# Patient Record
Sex: Female | Born: 1955 | Race: Black or African American | Hispanic: No | Marital: Single | Smoking: Former smoker
Health system: Southern US, Community
[De-identification: ages and names within clinical notes are randomized; demographics above are authoritative.]

## PROBLEM LIST (undated history)

## (undated) ENCOUNTER — Emergency Department (HOSPITAL_COMMUNITY): Discharge status: Absent without leave | Payer: BLUE CROSS/BLUE SHIELD

## (undated) DIAGNOSIS — M1990 Unspecified osteoarthritis, unspecified site: Secondary | ICD-10-CM

## (undated) DIAGNOSIS — H43393 Other vitreous opacities, bilateral: Secondary | ICD-10-CM

## (undated) DIAGNOSIS — E119 Type 2 diabetes mellitus without complications: Secondary | ICD-10-CM

## (undated) DIAGNOSIS — I10 Essential (primary) hypertension: Secondary | ICD-10-CM

## (undated) DIAGNOSIS — H538 Other visual disturbances: Secondary | ICD-10-CM

## (undated) DIAGNOSIS — E785 Hyperlipidemia, unspecified: Secondary | ICD-10-CM

## (undated) HISTORY — DX: Essential (primary) hypertension: I10

## (undated) HISTORY — DX: Type 2 diabetes mellitus without complications: E11.9

## (undated) HISTORY — DX: Unspecified osteoarthritis, unspecified site: M19.90

## (undated) HISTORY — PX: TUBAL LIGATION: SHX77

## (undated) HISTORY — DX: Hyperlipidemia, unspecified: E78.5

## (undated) HISTORY — PX: COLONOSCOPY: SHX174

---
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
## 1999-02-14 ENCOUNTER — Emergency Department (HOSPITAL_COMMUNITY): Admission: EM | Admit: 1999-02-14 | Discharge: 1999-02-14 | Payer: Self-pay | Admitting: Emergency Medicine

## 1999-07-26 ENCOUNTER — Emergency Department (HOSPITAL_COMMUNITY): Admission: EM | Admit: 1999-07-26 | Discharge: 1999-07-27 | Payer: Self-pay | Admitting: Emergency Medicine

## 1999-07-27 ENCOUNTER — Encounter: Payer: Self-pay | Admitting: Emergency Medicine

## 1999-07-29 ENCOUNTER — Emergency Department (HOSPITAL_COMMUNITY): Admission: EM | Admit: 1999-07-29 | Discharge: 1999-07-29 | Payer: Self-pay | Admitting: Emergency Medicine

## 2000-01-09 ENCOUNTER — Emergency Department (HOSPITAL_COMMUNITY): Admission: EM | Admit: 2000-01-09 | Discharge: 2000-01-09 | Payer: Self-pay | Admitting: Emergency Medicine

## 2000-01-14 ENCOUNTER — Emergency Department (HOSPITAL_COMMUNITY): Admission: EM | Admit: 2000-01-14 | Discharge: 2000-01-14 | Payer: Self-pay | Admitting: *Deleted

## 2001-02-05 ENCOUNTER — Emergency Department (HOSPITAL_COMMUNITY): Admission: EM | Admit: 2001-02-05 | Discharge: 2001-02-05 | Payer: Self-pay | Admitting: Internal Medicine

## 2001-02-05 ENCOUNTER — Encounter: Payer: Self-pay | Admitting: Internal Medicine

## 2001-10-21 ENCOUNTER — Emergency Department (HOSPITAL_COMMUNITY): Admission: EM | Admit: 2001-10-21 | Discharge: 2001-10-21 | Payer: Self-pay | Admitting: Emergency Medicine

## 2001-10-28 ENCOUNTER — Encounter: Payer: Self-pay | Admitting: Emergency Medicine

## 2001-10-28 ENCOUNTER — Ambulatory Visit (HOSPITAL_COMMUNITY): Admission: RE | Admit: 2001-10-28 | Discharge: 2001-10-28 | Payer: Self-pay | Admitting: Emergency Medicine

## 2001-11-19 ENCOUNTER — Encounter: Admission: RE | Admit: 2001-11-19 | Discharge: 2001-11-19 | Payer: Self-pay | Admitting: *Deleted

## 2002-12-02 ENCOUNTER — Encounter: Admission: RE | Admit: 2002-12-02 | Discharge: 2002-12-02 | Payer: Self-pay | Admitting: Family Medicine

## 2002-12-02 ENCOUNTER — Encounter: Payer: Self-pay | Admitting: Family Medicine

## 2003-09-04 ENCOUNTER — Emergency Department (HOSPITAL_COMMUNITY): Admission: EM | Admit: 2003-09-04 | Discharge: 2003-09-04 | Payer: Self-pay | Admitting: Emergency Medicine

## 2004-01-11 ENCOUNTER — Ambulatory Visit (HOSPITAL_COMMUNITY): Admission: RE | Admit: 2004-01-11 | Discharge: 2004-01-11 | Payer: Self-pay | Admitting: Family Medicine

## 2006-10-28 ENCOUNTER — Emergency Department (HOSPITAL_COMMUNITY): Admission: EM | Admit: 2006-10-28 | Discharge: 2006-10-28 | Payer: Self-pay | Admitting: Family Medicine

## 2007-11-19 ENCOUNTER — Encounter (INDEPENDENT_AMBULATORY_CARE_PROVIDER_SITE_OTHER): Payer: Self-pay | Admitting: Nurse Practitioner

## 2007-11-19 ENCOUNTER — Ambulatory Visit: Payer: Self-pay | Admitting: Internal Medicine

## 2007-11-19 LAB — CONVERTED CEMR LAB
ALT: 18 units/L (ref 0–35)
AST: 13 units/L (ref 0–37)
Alkaline Phosphatase: 95 units/L (ref 39–117)
BUN: 13 mg/dL (ref 6–23)
Basophils Absolute: 0 10*3/uL (ref 0.0–0.1)
Basophils Relative: 0 % (ref 0–1)
Calcium: 9.3 mg/dL (ref 8.4–10.5)
Creatinine, Ser: 0.59 mg/dL (ref 0.40–1.20)
Eosinophils Absolute: 0.2 10*3/uL (ref 0.0–0.7)
HDL: 61 mg/dL (ref >39)
Hemoglobin: 14.4 g/dL (ref 12.0–15.0)
LDL Cholesterol: 106 mg/dL — ABNORMAL HIGH (ref 0–99)
MCHC: 32.4 g/dL (ref 30.0–36.0)
MCV: 89.2 fL (ref 78.0–100.0)
Monocytes Absolute: 0.6 10*3/uL (ref 0.1–1.0)
Monocytes Relative: 8 % (ref 3–12)
Neutro Abs: 4 10*3/uL (ref 1.7–7.7)
RBC: 4.98 M/uL (ref 3.87–5.11)
RDW: 13.8 % (ref 11.5–15.5)
TSH: 0.393 microintl units/mL (ref 0.350–5.50)
Total Bilirubin: 0.5 mg/dL (ref 0.3–1.2)
Total CHOL/HDL Ratio: 3
VLDL: 14 mg/dL (ref 0–40)

## 2008-01-01 ENCOUNTER — Encounter (INDEPENDENT_AMBULATORY_CARE_PROVIDER_SITE_OTHER): Payer: Self-pay | Admitting: Nurse Practitioner

## 2008-01-01 ENCOUNTER — Encounter: Payer: Self-pay | Admitting: Internal Medicine

## 2008-01-01 ENCOUNTER — Ambulatory Visit: Payer: Self-pay | Admitting: Internal Medicine

## 2008-08-02 ENCOUNTER — Emergency Department (HOSPITAL_COMMUNITY): Admission: EM | Admit: 2008-08-02 | Discharge: 2008-08-02 | Payer: Self-pay | Admitting: Emergency Medicine

## 2008-08-06 ENCOUNTER — Emergency Department (HOSPITAL_COMMUNITY): Admission: EM | Admit: 2008-08-06 | Discharge: 2008-08-06 | Payer: Self-pay | Admitting: Emergency Medicine

## 2008-08-16 ENCOUNTER — Emergency Department (HOSPITAL_COMMUNITY): Admission: EM | Admit: 2008-08-16 | Discharge: 2008-08-16 | Payer: Self-pay | Admitting: Emergency Medicine

## 2010-03-31 ENCOUNTER — Emergency Department (HOSPITAL_COMMUNITY): Admission: EM | Admit: 2010-03-31 | Discharge: 2010-03-31 | Payer: Self-pay | Admitting: Emergency Medicine

## 2010-06-11 ENCOUNTER — Ambulatory Visit (HOSPITAL_COMMUNITY): Admission: RE | Admit: 2010-06-11 | Discharge: 2010-06-11 | Payer: Self-pay | Admitting: Family Medicine

## 2010-09-18 ENCOUNTER — Ambulatory Visit (HOSPITAL_COMMUNITY)
Admission: RE | Admit: 2010-09-18 | Discharge: 2010-09-18 | Payer: Self-pay | Source: Home / Self Care | Attending: Internal Medicine | Admitting: Internal Medicine

## 2010-10-04 ENCOUNTER — Ambulatory Visit: Admit: 2010-10-04 | Payer: Self-pay | Admitting: Obstetrics and Gynecology

## 2013-09-06 ENCOUNTER — Encounter (HOSPITAL_COMMUNITY): Payer: Self-pay | Admitting: Emergency Medicine

## 2013-09-06 ENCOUNTER — Emergency Department (HOSPITAL_COMMUNITY)
Admission: EM | Admit: 2013-09-06 | Discharge: 2013-09-06 | Disposition: A | Discharge status: Home / Self Care | Payer: BC Managed Care – PPO | Attending: Emergency Medicine | Admitting: Emergency Medicine

## 2013-09-06 DIAGNOSIS — H9209 Otalgia, unspecified ear: Secondary | ICD-10-CM | POA: Insufficient documentation

## 2013-09-06 DIAGNOSIS — K029 Dental caries, unspecified: Secondary | ICD-10-CM | POA: Insufficient documentation

## 2013-09-06 DIAGNOSIS — K047 Periapical abscess without sinus: Secondary | ICD-10-CM | POA: Insufficient documentation

## 2013-09-06 DIAGNOSIS — R51 Headache: Secondary | ICD-10-CM | POA: Insufficient documentation

## 2013-09-06 MED ORDER — OXYCODONE-ACETAMINOPHEN 5-325 MG PO TABS
1.0000 | ORAL_TABLET | Freq: Once | ORAL | Status: AC
  Administered 2013-09-06: 1 via ORAL
  Filled 2013-09-06: qty 1

## 2013-09-06 MED ORDER — CLINDAMYCIN HCL 150 MG PO CAPS: 300.0000 mg | ORAL_CAPSULE | Freq: Three times a day (TID) | ORAL | Status: DC

## 2013-09-06 MED ORDER — HYDROCODONE-ACETAMINOPHEN 5-325 MG PO TABS: 1.0000 | ORAL_TABLET | Freq: Four times a day (QID) | ORAL | Status: DC | PRN

## 2013-09-06 MED ORDER — IBUPROFEN 600 MG PO TABS: 600.0000 mg | ORAL_TABLET | Freq: Four times a day (QID) | ORAL | Status: DC | PRN

## 2013-09-06 MED ORDER — CLINDAMYCIN HCL 150 MG PO CAPS
300.0000 mg | ORAL_CAPSULE | Freq: Once | ORAL | Status: AC
  Administered 2013-09-06: 300 mg via ORAL
  Filled 2013-09-06: qty 2

## 2013-09-06 NOTE — ED Provider Notes (Signed)
CSN: [id number]     Arrival date & time 09/06/13  4098 History   This chart was scribed for non-physician practitioner Jaynie Crumble, PA-C, working with Gavin Pound. Oletta Lamas, MD, by Yevette Edwards, ED Scribe. This patient was seen in room TR07C/TR07C and the patient's care was started at 9:04 AM.  First MD Initiated Contact with Patient 09/06/13 0900     No chief complaint on file.  The history is provided by the patient. No language interpreter was used.   HPI Comments: Colleen Williams is a 57 y.o. female who presents to the Emergency Department complaining of a suspected abscess to a right lower tooth which began three days ago and has progressively worsened. She has treated the affected site with peroxide, warm compresses,  IBU, and tylenol without resolution. As associated symptoms, she has also experienced right-sided facial pain and right-sided otalgia. She denies a fever. She also denies a h/o similar symptoms.  The pt is a non-smoker.   History reviewed. No pertinent past medical history. Past Surgical History  Procedure Laterality Date  . Tubal ligation     No family history on file. History  Substance Use Topics  . Smoking status: Never Smoker   . Smokeless tobacco: Not on file  . Alcohol Use: No   No OB history provided.  Review of Systems  Constitutional: Negative for fever.  HENT: Positive for dental problem, ear pain and facial swelling.   All other systems reviewed and are negative.   Allergies  Review of patient's allergies indicates no known allergies.  Home Medications   Current Outpatient Rx  Name  Route  Sig  Dispense  Refill  . ibuprofen (ADVIL,MOTRIN) 200 MG tablet   Oral   Take 600 mg by mouth every 6 (six) hours as needed for moderate pain.         . naproxen sodium (ANAPROX) 220 MG tablet   Oral   Take 440 mg by mouth 2 (two) times daily with a meal.          Triage Vitals: BP 147/81  Pulse 82  Temp(Src) 98.4 F (36.9 C) (Oral)  SpO2  99%  Physical Exam  Nursing note and vitals reviewed. Constitutional: She is oriented to person, place, and time. She appears well-developed and well-nourished. No distress.  HENT:  Head: Normocephalic and atraumatic.  Multiple dental caries.  Abscess surrounding right lower first molar. Tender to palpation. No swelling under the tongue. No trismus. The pt has facial swelling to right lower face.   Eyes: EOM are normal.  Neck: Neck supple. No tracheal deviation present.  Cardiovascular: Normal rate.   Pulmonary/Chest: Effort normal. No respiratory distress.  Musculoskeletal: Normal range of motion.  Neurological: She is alert and oriented to person, place, and time.  Skin: Skin is warm and dry.  Psychiatric: She has a normal mood and affect. Her behavior is normal.    ED Course  Procedures (including critical care time)  DIAGNOSTIC STUDIES: Oxygen Saturation is 99% on room air, normal by my interpretation.    COORDINATION OF CARE:  9:12 AM- Discussed treatment plan with patient, which includes an antibiotic and pain medication, and the patient agreed to the plan. The pt denies the offer to lance the abscess.   Labs Review Labs Reviewed - No data to display Imaging Review No results found.  EKG Interpretation   None       MDM   1. Dental abscess     PT with dental pain and  an abscess. I have offered her drainage of the abscess which she refused. Pt is non toxic. Afebrile. No signs of ludwigs angina. Home with clindamycin, pain medications, follow up with a dentist.   Filed Vitals:   09/06/13 0822  BP: 147/81  Pulse: 82  Temp: 98.4 F (36.9 C)   9:58 AM Pt decided to have her tooth I&Ded after discharge.   INCISION AND DRAINAGE Performed by: Jaynie Crumble A Consent: Verbal consent obtained. Risks and benefits: risks, benefits and alternatives were discussed Type: abscess  Body area: right gum  Anesthesia: anesthetic spray  Incision was made with  a scalpel.  Local anesthetic: huricaine spray  Complexity: complex  Drainage: purulent  Drainage amount: large   Patient tolerance: Patient tolerated the procedure well with no immediate complications.    I personally performed the services described in this documentation, which was scribed in my presence. The recorded information has been reviewed and is accurate.    Lottie Mussel, PA-C 09/06/13 [id number]  Lottie Mussel, PA-C 09/06/13 [phone]

## 2013-09-06 NOTE — ED Notes (Signed)
Pt started 2 days ago with left lower dental pain. Now has significant swelling.

## 2013-09-06 NOTE — ED Notes (Signed)
Pt had initially refused I&D of abscess but now requests it be done.

## 2013-09-06 NOTE — ED Provider Notes (Signed)
Medical screening examination/treatment/procedure(s) were performed by non-physician practitioner and as supervising physician I was immediately available for consultation/collaboration.  EKG Interpretation   None         Gavin Pound. Jakaiya Netherland, MD 09/06/13 1556

## 2015-06-12 ENCOUNTER — Encounter: Payer: Self-pay | Admitting: Internal Medicine

## 2015-06-12 ENCOUNTER — Ambulatory Visit (INDEPENDENT_AMBULATORY_CARE_PROVIDER_SITE_OTHER): Payer: 59 | Admitting: Internal Medicine

## 2015-06-12 VITALS — BP 134/75 | HR 70 | Temp 98.0°F | Ht 66.0 in | Wt 226.3 lb

## 2015-06-12 DIAGNOSIS — Z1231 Encounter for screening mammogram for malignant neoplasm of breast: Secondary | ICD-10-CM

## 2015-06-12 DIAGNOSIS — M5416 Radiculopathy, lumbar region: Secondary | ICD-10-CM | POA: Insufficient documentation

## 2015-06-12 DIAGNOSIS — Z0000 Encounter for general adult medical examination without abnormal findings: Secondary | ICD-10-CM | POA: Diagnosis not present

## 2015-06-12 DIAGNOSIS — Z8679 Personal history of other diseases of the circulatory system: Secondary | ICD-10-CM | POA: Diagnosis not present

## 2015-06-12 HISTORY — DX: Radiculopathy, lumbar region: M54.16

## 2015-06-12 LAB — GLUCOSE, CAPILLARY: GLUCOSE-CAPILLARY: 126 mg/dL — AB (ref 65–99)

## 2015-06-12 LAB — POCT GLYCOSYLATED HEMOGLOBIN (HGB A1C): Hemoglobin A1C: 6

## 2015-06-12 MED ORDER — MELOXICAM 15 MG PO TABS: 15.0000 mg | ORAL_TABLET | Freq: Every day | ORAL | Status: DC

## 2015-06-12 MED ORDER — CYCLOBENZAPRINE HCL 10 MG PO TABS: 10.0000 mg | ORAL_TABLET | Freq: Three times a day (TID) | ORAL | Status: DC | PRN

## 2015-06-12 NOTE — Patient Instructions (Addendum)
FOR PAIN, TAKE MOBIC 15 MG DAILY AND FLEXERIL 10 MG THREE TIMES AS NEEDED TO RELAX THOSE MUSCLES. THIS CAN MAKE YOU SLEEPY. IF PAIN CONTINUES AFTER 2 WEEKS DESPITE EXERCISE AND PAIN CONTROL, WE WILL REFER YOU TO PHYSICAL THERAPY.  RETURN IN 6 MONTHS  Sciatica Sciatica is pain, weakness, numbness, or tingling along the path of the sciatic nerve. The nerve starts in the lower back and runs down the back of each leg. The nerve controls the muscles in the lower leg and in the back of the knee, while also providing sensation to the back of the thigh, lower leg, and the sole of your foot. Sciatica is a symptom of another medical condition. For instance, nerve damage or certain conditions, such as a herniated disk or bone spur on the spine, pinch or put pressure on the sciatic nerve. This causes the pain, weakness, or other sensations normally associated with sciatica. Generally, sciatica only affects one side of the body. CAUSES   Herniated or slipped disc.  Degenerative disk disease.  A pain disorder involving the narrow muscle in the buttocks (piriformis syndrome).  Pelvic injury or fracture.  Pregnancy.  Tumor (rare). SYMPTOMS  Symptoms can vary from mild to very severe. The symptoms usually travel from the low back to the buttocks and down the back of the leg. Symptoms can include:  Mild tingling or dull aches in the lower back, leg, or hip.  Numbness in the back of the calf or sole of the foot.  Burning sensations in the lower back, leg, or hip.  Sharp pains in the lower back, leg, or hip.  Leg weakness.  Severe back pain inhibiting movement. These symptoms may get worse with coughing, sneezing, laughing, or prolonged sitting or standing. Also, being overweight may worsen symptoms. DIAGNOSIS  Your caregiver will perform a physical exam to look for common symptoms of sciatica. He or she may ask you to do certain movements or activities that would trigger sciatic nerve pain. Other  tests may be performed to find the cause of the sciatica. These may include:  Blood tests.  X-rays.  Imaging tests, such as an MRI or CT scan. TREATMENT  Treatment is directed at the cause of the sciatic pain. Sometimes, treatment is not necessary and the pain and discomfort goes away on its own. If treatment is needed, your caregiver may suggest:  Over-the-counter medicines to relieve pain.  Prescription medicines, such as anti-inflammatory medicine, muscle relaxants, or narcotics.  Applying heat or ice to the painful area.  Steroid injections to lessen pain, irritation, and inflammation around the nerve.  Reducing activity during periods of pain.  Exercising and stretching to strengthen your abdomen and improve flexibility of your spine. Your caregiver may suggest losing weight if the extra weight makes the back pain worse.  Physical therapy.  Surgery to eliminate what is pressing or pinching the nerve, such as a bone spur or part of a herniated disk. HOME CARE INSTRUCTIONS   Only take over-the-counter or prescription medicines for pain or discomfort as directed by your caregiver.  Apply ice to the affected area for 20 minutes, 3-4 times a day for the first 48-72 hours. Then try heat in the same way.  Exercise, stretch, or perform your usual activities if these do not aggravate your pain.  Attend physical therapy sessions as directed by your caregiver.  Keep all follow-up appointments as directed by your caregiver.  Do not wear high heels or shoes that do not provide proper  support.  Check your mattress to see if it is too soft. A firm mattress may lessen your pain and discomfort. SEEK IMMEDIATE MEDICAL CARE IF:   You lose control of your bowel or bladder (incontinence).  You have increasing weakness in the lower back, pelvis, buttocks, or legs.  You have redness or swelling of your back.  You have a burning sensation when you urinate.  You have pain that gets  worse when you lie down or awakens you at night.  Your pain is worse than you have experienced in the past.  Your pain is lasting longer than 4 weeks.  You are suddenly losing weight without reason. MAKE SURE YOU:  Understand these instructions.  Will watch your condition.  Will get help right away if you are not doing well or get worse. Document Released: 08/27/2001 Document Revised: 03/03/2012 Document Reviewed: 01/12/2012 [facility] Patient Information 2015 [place], [place]. This information is not intended to replace advice given to you by your health care provider. Make sure you discuss any questions you have with your health care provider.  Piriformis Syndrome with Rehab Piriformis syndrome is a condition the affects the nervous system in the area of the hip, and is characterized by pain and possibly a loss of feeling in the backside (posterior) thigh that may extend down the entire length of the leg. The symptoms are caused by an increase in pressure on the sciatic nerve by the piriformis muscle, which is on the back of the hip and is responsible for externally rotating the hip. The sciatic nerve and its branches connect to much of the leg. Normally the sciatic nerve runs between the piriformis muscle and other muscles. However, in certain individuals the nerve runs through the muscle, which causes an increase in pressure on the nerve and results in the symptoms of piriformis syndrome. SYMPTOMS   Pain, tingling, numbness, or burning in the back of the thigh that may also extend down the entire leg.  Occasionally, tenderness in the buttock.  Loss of function of the leg.  Pain that worsens when using the piriformis muscle (running, jumping, or stairs).  Pain that increases with prolonged sitting.  Pain that is lessened by lying flat on the back. CAUSES   Piriformis syndrome is the result of an increase in pressure placed on the sciatic nerve. Oftentimes, piriformis syndrome is an  overuse injury.  Stress placed on the nerve from a sudden increase in the intensity, frequency, or duration of training.  Compensation of other extremity injuries. RISK INCREASES WITH:  Sports that involve the piriformis muscle (running, walking, or jumping).  You are born with (congenital) a defect in which the sciatic nerve passes through the muscle. PREVENTION  Warm up and stretch properly before activity.  Allow for adequate recovery between workouts.  Maintain physical fitness:  Strength, flexibility, and endurance.  Cardiovascular fitness. PROGNOSIS  If treated properly, the symptoms of piriformis syndrome usually resolve in 2 to 6 weeks. RELATED COMPLICATIONS   Persistent and possibly permanent pain and numbness in the lower extremity.  Weakness of the extremity that may progress to disability and inability to compete. TREATMENT  The most effective treatment for piriformis syndrome is rest from any activities that aggravate the symptoms. Ice and pain medication may help reduce pain and inflammation. The use of strengthening and stretching exercises may help reduce pain with activity. These exercises may be performed at home or with a therapist. A referral to a therapist may be given for further evaluation  and treatment, such as ultrasound. Corticosteroid injections may be given to reduce inflammation that is causing pressure to be placed on the sciatic nerve. If nonsurgical (conservative) treatment is unsuccessful, then surgery may be recommended.  MEDICATION   If pain medication is necessary, then nonsteroidal anti-inflammatory medications, such as aspirin and ibuprofen, or other minor pain relievers, such as acetaminophen, are often recommended.  Do not take pain medication for 7 days before surgery.  Prescription pain relievers may be given if deemed necessary by your caregiver. Use only as directed and only as much as you need.  Corticosteroid injections may be given  by your caregiver. These injections should be reserved for the most serious cases, because they may only be given a certain number of times. HEAT AND COLD:   Cold treatment (icing) relieves pain and reduces inflammation. Cold treatment should be applied for 10 to 15 minutes every 2 to 3 hours for inflammation and pain and immediately after any activity that aggravates your symptoms. Use ice packs or massage the area with a piece of ice (ice massage).  Heat treatment may be used prior to performing the stretching and strengthening activities prescribed by your caregiver, physical therapist, or athletic trainer. Use a heat pack or soak the injury in warm water. SEEK IMMEDIATE MEDICAL CARE IF:  Treatment seems to offer no benefit, or the condition worsens.  Any medications produce adverse side effects. EXERCISES RANGE OF MOTION (ROM) AND STRETCHING EXERCISES - Piriformis Syndrome These exercises may help you when beginning to rehabilitate your injury. Your symptoms may resolve with or without further involvement from your physician, physical therapist, or athletic trainer. While completing these exercises, remember:   Restoring tissue flexibility helps normal motion to return to the joints. This allows healthier, less painful movement and activity.  An effective stretch should be held for at least 30 seconds.  A stretch should never be painful. You should only feel a gentle lengthening or release in the stretched tissue. STRETCH - Hip Rotators  Lie on your back on a firm surface. Grasp your right / left knee with your right / left hand and your ankle with your opposite hand.  Keeping your hips and shoulders firmly planted, gently pull your right / left knee and rotate your lower leg toward your opposite shoulder until you feel a stretch in your buttocks.  Hold this stretch for __________ seconds. Repeat this stretch __________ times. Complete this stretch __________ times per day. STRETCH -  Iliotibial Band  On the floor or bed, lie on your side so your right / left leg is on top. Bend your knee and grab your ankle.  Slowly bring your knee back so that your thigh is in line with your trunk. Keep your heel at your buttocks and gently arch your back so your head, shoulders, and hips line up.  Slowly lower your leg so that your knee approaches the floor/bed until you feel a gentle stretch on the outside of your right / left thigh. If you do not feel a stretch and your knee will not fall farther, place the heel of your opposite foot on top of your knee and pull your thigh down farther.  Hold this stretch for __________ seconds. Repeat __________ times. Complete __________ times per day. STRENGTHENING EXERCISES - Piriformis Syndrome  These are some of the caregiver again or until your symptoms are resolved. Remember:   Strong muscles with good endurance tolerate stress better.  Do the exercises as initially prescribed by  your caregiver. Progress slowly with each exercise, gradually increasing the number of repetitions and weight used under their guidance. STRENGTH - Hip Abductors, Straight Leg Raises Be aware of your form throughout the entire exercise so that you exercise the correct muscles. Sloppy form means that you are not strengthening the correct muscles.  Lie on your side so that your head, shoulders, knee, and hip line up. You may bend your lower knee to help maintain your balance. Your right / left leg should be on top.  Roll your hips slightly forward, so that your hips are stacked directly over each other and your right / left knee is facing forward.  Lift your top leg up 4-6 inches, leading with your heel. Be sure that your foot does not drift forward or that your knee does not roll toward the ceiling.  Hold this position for __________ seconds. You should feel the muscles in your outer hip lifting (you may not notice this until your leg begins to tire).  Slowly lower  your leg to the starting position. Allow the muscles to fully relax before beginning the next repetition. Repeat __________ times. Complete this exercise __________ times per day.  STRENGTH - Hip Abductors, Quadruped  On a firm, lightly padded surface, position yourself on your hands and knees. Your hands should be directly below your shoulders and your knees should be directly below your hips.  Keeping your right / left knee bent, lift your leg out to the side. Keep your legs level and in line with your shoulders.  Position yourself on your hands and knees.  Hold for __________ seconds.  Keeping your trunk steady and your hips level, slowly lower your leg to the starting position. Repeat __________ times. Complete this exercise __________ times per day.  STRENGTH - Hip Abductors, Standing  Tie one end of a rubber exercise band/tubing to a secure surface (table, pole) and tie a loop at the other end.  Place the loop around your right / left ankle. Keeping your ankle with the band directly opposite of the secured end, step away until there is tension in the tube/band.  Hold onto a chair as needed for balance.  Keeping your back upright, your shoulders over your hips, and your toes pointing forward, lift your right / left leg out to your side. Be sure to lift your leg with your hip muscles. Do not "throw" your leg or tip your body to lift your leg.  Slowly and with control, return to the starting position. Repeat exercise __________ times. Complete this exercise __________ times per day.  Document Released: 09/02/2005 Document Revised: 01/17/2014 Document Reviewed: 12/15/2008 [facility] Patient Information 2015 [place], [place]. This information is not intended to replace advice given to you by your health care provider. Make sure you discuss any questions you have with your health care provider.

## 2015-06-12 NOTE — Assessment & Plan Note (Addendum)
Mammogram: Last recorded mammogram in system was normal in 2012. Order placed for mammogram. Pap Smear: Last recorded pap smear in system was normal in 2011. Repeat at next visit. Flu Shot: Refused Colonoscopy: Never had. Will refer for colonoscopy once PCP changed on insurance card.   Ordered routine labs: CBC, BMET, TSH, Lipid Panel, HIV, Hepatitis C, and HgbA1c

## 2015-06-12 NOTE — Assessment & Plan Note (Addendum)
Patient has a history of DJD of L4 and L5 which was seen on lumbar xray in 2011 and resulting lumbar radiculopathy of right side. Patient was previously told she had radiculopathy/sciatica in the past and was treated with NSAIDs prn. Patient states in the last several weeks, her pain has seemed to flare up mostly on the right side and is localized to her posterior hip. Pain is constant and described as sharp. She is unsure what makes it worse, but NSAIDs make it better. She denies any associated pain in her right or left lower extremities or radiation of pain. She denies numbness, weakness, urinary or bowel incontinence. She has no problems with ambulation. Physical exam and symptoms are most consistent with lumbar radiculopathy/sciatica versus piriformis syndrome.  Plan: -Mobic 15 mg daily -Flexeril 10 mg TID prn -Stretching and exercise -Return if pain does not improve or worsens -Can consider PT referral and steroid injections at that time

## 2015-06-12 NOTE — Progress Notes (Signed)
Subjective:    Patient ID: Colleen Williams, female    DOB: Sep 18, 1955, 59 y.o.   MRN: [id number]  HPI Colleen Williams is a 59 y.o. female with PMHx of DJD and lumbar radiculopathy who presents to the clinic for right hip pain. Please see A&P for the status of the patient's chronic medical problems.   No past medical history on file.  PMHx: Degenerative Joint Disease Lumbar Radiculopathy  Outpatient Encounter Prescriptions as of 06/12/2015  Medication Sig  . cyclobenzaprine (FLEXERIL) 10 MG tablet Take 1 tablet (10 mg total) by mouth 3 (three) times daily as needed for muscle spasms.  . meloxicam (MOBIC) 15 MG tablet Take 1 tablet (15 mg total) by mouth daily.  . [DISCONTINUED] clindamycin (CLEOCIN) 150 MG capsule Take 2 capsules (300 mg total) by mouth 3 (three) times daily.  . [DISCONTINUED] HYDROcodone-acetaminophen (NORCO) 5-325 MG per tablet Take 1 tablet by mouth every 6 (six) hours as needed for moderate pain.  . [DISCONTINUED] ibuprofen (ADVIL,MOTRIN) 200 MG tablet Take 600 mg by mouth every 6 (six) hours as needed for moderate pain.  . [DISCONTINUED] ibuprofen (ADVIL,MOTRIN) 600 MG tablet Take 1 tablet (600 mg total) by mouth every 6 (six) hours as needed.  . [DISCONTINUED] naproxen sodium (ANAPROX) 220 MG tablet Take 440 mg by mouth 2 (two) times daily with a meal.   No facility-administered encounter medications on file as of 06/12/2015.    FMHx: HTN  Social History   Social History  . Marital Status: Single    Spouse Name: N/A  . Number of Children: N/A  . Years of Education: N/A   Occupational History  . Not on file.   Social History Main Topics  . Smoking status: Former Smoker    Quit date: 09/16/2008  . Smokeless tobacco: Not on file  . Alcohol Use: No  . Drug Use: No  . Sexual Activity: Not on file   Other Topics Concern  . Not on file   Social History Narrative   Review of Systems General: Denies fever, chills, fatigue, change in appetite  Respiratory:  Denies SOB, cough, DOE   Cardiovascular: Denies chest pain and palpitations.  Gastrointestinal: Denies nausea, vomiting, abdominal pain, diarrhea, constipation, blood in stool  Endocrine: Denies hot or cold intolerance, polyuria, and polydipsia. Musculoskeletal: Admits to right hip pain. Denies myalgias, joint swelling, arthralgias and gait problem.  Skin: Denies pallor, rash and wounds.  Neurological: Denies dizziness, headaches, weakness, lightheadedness, numbness    Objective:   Physical Exam Filed Vitals:   06/12/15 1007  BP: 134/75  Pulse: 70  Temp: 98 F (36.7 C)  TempSrc: Oral  Height: 5\' 6"  (1.676 m)  Weight: 226 lb 4.8 oz (102.649 kg)  SpO2: 99%   General: Vital signs reviewed.  Patient is well-developed and well-nourished, in no acute distress and cooperative with exam.  Head: Normocephalic and atraumatic. Eyes: EOMI, conjunctivae normal, no scleral icterus.  Neck: Supple, trachea midline, normal ROM, no JVD, masses, or carotid bruit present.  Cardiovascular: RRR, S1 normal, S2 normal, no murmurs, gallops, or rubs. Pulmonary/Chest: Clear to auscultation bilaterally, no wheezes, rales, or rhonchi. Abdominal: Soft, non-tender, non-distended, BS + Musculoskeletal: Tenderness on palpation of posterior right hip halfway between L5 and right greater trochanter. Negative SLR. Normal achilles and patellar reflexes. Normal ROM. No joint deformities, erythema, or stiffness. Extremities: No lower extremity edema bilaterally, pulses symmetric and intact bilaterally. Neurological: Strength is normal and symmetric bilaterally, sensory intact to light touch bilaterally.  Skin: Warm, dry  and intact. No rashes or erythema. Psychiatric: Normal mood and affect. speech and behavior is normal. Cognition and memory are normal.     Assessment & Plan:   Please see problem oriented assessment and plan.

## 2015-06-13 LAB — LIPID PANEL
CHOL/HDL RATIO: 3.6 ratio (ref 0.0–4.4)
Cholesterol, Total: 189 mg/dL (ref 100–199)
HDL: 53 mg/dL (ref >39)
LDL Calculated: 114 mg/dL — ABNORMAL HIGH (ref 0–99)
TRIGLYCERIDES: 109 mg/dL (ref 0–149)
VLDL Cholesterol Cal: 22 mg/dL (ref 5–40)

## 2015-06-13 LAB — BMP8+ANION GAP
ANION GAP: 16 mmol/L (ref 10.0–18.0)
BUN/Creatinine Ratio: 25 — ABNORMAL HIGH (ref 9–23)
BUN: 16 mg/dL (ref 6–24)
CALCIUM: 9.7 mg/dL (ref 8.7–10.2)
CHLORIDE: 101 mmol/L (ref 97–108)
CO2: 24 mmol/L (ref 18–29)
Creatinine, Ser: 0.65 mg/dL (ref 0.57–1.00)
GFR calc non Af Amer: 98 mL/min/{1.73_m2} (ref >59)
GFR, EST AFRICAN AMERICAN: 113 mL/min/{1.73_m2} (ref >59)
GLUCOSE: 128 mg/dL — AB (ref 65–99)
POTASSIUM: 5 mmol/L (ref 3.5–5.2)
Sodium: 141 mmol/L (ref 134–144)

## 2015-06-13 LAB — CBC
HEMATOCRIT: 43.5 % (ref 34.0–46.6)
Hemoglobin: 14.4 g/dL (ref 11.1–15.9)
MCH: 28.8 pg (ref 26.6–33.0)
MCHC: 33.1 g/dL (ref 31.5–35.7)
MCV: 87 fL (ref 79–97)
Platelets: 269 10*3/uL (ref 150–379)
RBC: 5 x10E6/uL (ref 3.77–5.28)
RDW: 14.1 % (ref 12.3–15.4)
WBC: 6.6 10*3/uL (ref 3.4–10.8)

## 2015-06-13 LAB — HCV COMMENT:

## 2015-06-13 LAB — TSH: TSH: 0.562 u[IU]/mL (ref 0.450–4.500)

## 2015-06-13 LAB — HEPATITIS C ANTIBODY (REFLEX)

## 2015-06-13 LAB — HIV ANTIBODY (ROUTINE TESTING W REFLEX): HIV Screen 4th Generation wRfx: NONREACTIVE

## 2015-06-13 NOTE — Addendum Note (Signed)
Addended by: Gilles Chiquito B on: 06/13/2015 09:42 AM   Modules accepted: Level of Service

## 2015-06-13 NOTE — Progress Notes (Signed)
Internal Medicine Clinic Attending  Case discussed with Dr. Marvel Plan at the time of the visit.  We reviewed the resident's history and exam and pertinent patient test results.  I agree with the assessment, diagnosis, and plan of care documented in the resident's note.  Patient is new to the clinic.  Other issues included possible history of HTN, but she was normotensive at this visit, per resident report.  This will be confirmed at next visit with repeat blood pressure monitoring.

## 2015-08-01 ENCOUNTER — Encounter: Payer: Self-pay | Admitting: Student

## 2015-10-12 ENCOUNTER — Ambulatory Visit
Admission: RE | Admit: 2015-10-12 | Discharge: 2015-10-12 | Disposition: A | Discharge status: Home / Self Care | Payer: BLUE CROSS/BLUE SHIELD | Source: Ambulatory Visit | Attending: Internal Medicine | Admitting: Internal Medicine

## 2015-10-12 DIAGNOSIS — Z1231 Encounter for screening mammogram for malignant neoplasm of breast: Secondary | ICD-10-CM

## 2016-03-05 ENCOUNTER — Encounter (HOSPITAL_COMMUNITY): Payer: Self-pay | Admitting: Emergency Medicine

## 2016-03-05 ENCOUNTER — Emergency Department (HOSPITAL_COMMUNITY)
Admission: EM | Admit: 2016-03-05 | Discharge: 2016-03-05 | Disposition: A | Discharge status: Home / Self Care | Payer: BLUE CROSS/BLUE SHIELD | Attending: Emergency Medicine | Admitting: Emergency Medicine

## 2016-03-05 ENCOUNTER — Other Ambulatory Visit: Payer: Self-pay

## 2016-03-05 DIAGNOSIS — Z87891 Personal history of nicotine dependence: Secondary | ICD-10-CM | POA: Insufficient documentation

## 2016-03-05 DIAGNOSIS — M7989 Other specified soft tissue disorders: Secondary | ICD-10-CM | POA: Insufficient documentation

## 2016-03-05 DIAGNOSIS — R600 Localized edema: Secondary | ICD-10-CM | POA: Insufficient documentation

## 2016-03-05 DIAGNOSIS — R2243 Localized swelling, mass and lump, lower limb, bilateral: Secondary | ICD-10-CM

## 2016-03-05 LAB — URINALYSIS, ROUTINE W REFLEX MICROSCOPIC
Bilirubin Urine: NEGATIVE
GLUCOSE, UA: 250 mg/dL — AB
Ketones, ur: NEGATIVE mg/dL
LEUKOCYTES UA: NEGATIVE
Nitrite: POSITIVE — AB
PROTEIN: NEGATIVE mg/dL
Specific Gravity, Urine: 1.03 (ref 1.005–1.030)
pH: 5.5 (ref 5.0–8.0)

## 2016-03-05 LAB — COMPREHENSIVE METABOLIC PANEL
ALT: 29 U/L (ref 14–54)
AST: 23 U/L (ref 15–41)
Albumin: 3.6 g/dL (ref 3.5–5.0)
Alkaline Phosphatase: 95 U/L (ref 38–126)
Anion gap: 5 (ref 5–15)
BILIRUBIN TOTAL: 0.2 mg/dL — AB (ref 0.3–1.2)
BUN: 15 mg/dL (ref 6–20)
CHLORIDE: 107 mmol/L (ref 101–111)
CO2: 25 mmol/L (ref 22–32)
CREATININE: 0.68 mg/dL (ref 0.44–1.00)
Calcium: 9 mg/dL (ref 8.9–10.3)
Glucose, Bld: 115 mg/dL — ABNORMAL HIGH (ref 65–99)
POTASSIUM: 3.8 mmol/L (ref 3.5–5.1)
Sodium: 137 mmol/L (ref 135–145)
Total Protein: 6.6 g/dL (ref 6.5–8.1)

## 2016-03-05 LAB — CBC
HCT: 43.1 % (ref 36.0–46.0)
Hemoglobin: 13.9 g/dL (ref 12.0–15.0)
MCH: 28 pg (ref 26.0–34.0)
MCHC: 32.3 g/dL (ref 30.0–36.0)
MCV: 86.9 fL (ref 78.0–100.0)
PLATELETS: 242 10*3/uL (ref 150–400)
RBC: 4.96 MIL/uL (ref 3.87–5.11)
RDW: 13.9 % (ref 11.5–15.5)
WBC: 6.5 10*3/uL (ref 4.0–10.5)

## 2016-03-05 LAB — URINE MICROSCOPIC-ADD ON

## 2016-03-05 NOTE — ED Provider Notes (Signed)
CSN: [id number]     Arrival date & time 03/05/16  1057 History   First MD Initiated Contact with Patient 03/05/16 1504     Chief Complaint  Patient presents with  . Leg Swelling   (Consider location/radiation/quality/duration/timing/severity/associated sxs/prior Treatment) HPI Comments: Patient presents with complaint of bilateral pedal edema over the past week. Swelling is symmetric. She states that the skin feels "tight". No redness, pain, or drainage. Patient is never had swelling like this before. She states that the swelling is temporarily improved with elevation, however she is on her feet a lot and the swelling recurs. Patient denies any chest pain, shortness of breath with or without exertion, orthopnea, or any history of congestive heart failure. Normal urination without history of kidney disease. No history of liver disease or cirrhosis. Patient is not on any blood pressure medications. No history or risk factors for DVT. No other treatments prior to arrival. She has not used compression hose. [facility].   The history is provided by the patient and medical records.    History reviewed. No pertinent past medical history. Past Surgical History  Procedure Laterality Date  . Tubal ligation     No family history on file. Social History  Substance Use Topics  . Smoking status: Former Smoker    Quit date: 09/16/2008  . Smokeless tobacco: None  . Alcohol Use: No   OB History    No data available     Review of Systems  Constitutional: Negative for fever.  HENT: Negative for rhinorrhea and sore throat.   Eyes: Negative for redness.  Respiratory: Negative for cough.   Cardiovascular: Positive for leg swelling. Negative for chest pain.  Gastrointestinal: Negative for nausea, vomiting, abdominal pain and diarrhea.  Genitourinary: Negative for dysuria.  Musculoskeletal: Negative for myalgias.  Skin: Negative for rash.  Neurological: Negative for headaches.     Allergies  Review of patient's allergies indicates no known allergies.  Home Medications   Prior to Admission medications   Medication Sig Start Date End Date Taking? Authorizing Provider  cyclobenzaprine (FLEXERIL) 10 MG tablet Take 1 tablet (10 mg total) by mouth 3 (three) times daily as needed for muscle spasms. 06/12/15   Florinda Marker, MD  meloxicam (MOBIC) 15 MG tablet Take 1 tablet (15 mg total) by mouth daily. 06/12/15   Alexa Angela Burke, MD   BP 136/85 mmHg  Pulse 71  Temp(Src) 98.7 F (37.1 C) (Oral)  Resp 18  SpO2 97%   Physical Exam  Constitutional: She appears well-developed and well-nourished.  HENT:  Head: Normocephalic and atraumatic.  Mouth/Throat: Oropharynx is clear and moist.  Eyes: Conjunctivae are normal. Right eye exhibits no discharge. Left eye exhibits no discharge.  Neck: Normal range of motion. Neck supple.  Cardiovascular: Normal rate, regular rhythm and normal heart sounds.   Pulmonary/Chest: Effort normal and breath sounds normal. No respiratory distress. She has no wheezes. She has no rales.  Abdominal: Soft. There is no tenderness.  Musculoskeletal: She exhibits edema. She exhibits no tenderness.  Patient with 1+ nonpitting edema to mid ankles. Symmetric bilaterally.  Neurological: She is alert.  Skin: Skin is warm and dry.  Psychiatric: She has a normal mood and affect.  Nursing note and vitals reviewed.   ED Course  Procedures (including critical care time) Labs Review Labs Reviewed  COMPREHENSIVE METABOLIC PANEL - Abnormal; Notable for the following:    Glucose, Bld 115 (*)    Total Bilirubin 0.2 (*)  All other components within normal limits  URINALYSIS, ROUTINE W REFLEX MICROSCOPIC (NOT AT [facility]) - Abnormal; Notable for the following:    Glucose, UA 250 (*)    Hgb urine dipstick SMALL (*)    Nitrite POSITIVE (*)    All other components within normal limits  URINE MICROSCOPIC-ADD ON - Abnormal; Notable for the following:     Squamous Epithelial / LPF 0-5 (*)    Bacteria, UA MANY (*)    All other components within normal limits  CBC     EKG Interpretation   Date/Time:  Tuesday March 05 2016 16:21:01 EDT Ventricular Rate:  69 PR Interval:    QRS Duration: 82 QT Interval:  425 QTC Calculation: 456 R Axis:   32 Text Interpretation:  Sinus rhythm EARLY R WAVE PROGRESSION Confirmed by  RAY MD, Andee Poles [id number]) on 03/05/2016 4:41:04 PM       3:16 PM Patient seen and examined. Will perform work-up looking at kidney, liver, heart. If these are reassuring, will have patient continue elevation, obtain compression stockings, and f/u with PCP for further evaluation. Patient is not on CCB.    Vital signs reviewed and are as follows: BP 136/85 mmHg  Pulse 71  Temp(Src) 98.7 F (37.1 C) (Oral)  Resp 18  SpO2 97%  5:28 PM Patient discussed with and seen by Dr. Jeanell Sparrow.   Patient Updated on results. Advised that she watch her salt intake closely, start using compression stockings and elevate her legs as much as possible. Encouraged follow-up with her PCP. Patient asked about water pills. Discussed that I would defer this decision to her PCP after she has tried basic treatments.  Patient urged to return with worsening symptoms or other concerns. Patient verbalized understanding and agrees with plan.    MDM   Final diagnoses:  Localized swelling of both lower legs   Patient with bilateral lower extremity edema, new onset. No signs of CHF, liver failure, kidney failure. No BP meds. Patient is on her feet a lot and is likely related to venous stasis. Recommendations as above. Will continue management as outpatient.    Carlisle Cater, PA-C 03/05/16 1730  Pattricia Boss, MD 03/06/16 1335

## 2016-03-05 NOTE — ED Notes (Signed)
Pt states her feet have been swollen x1 week, states she tried elevating them without relief. States they feel tight. Denies leg pain or calf swelling, pt ambulatory with steady gait. Pt denies medical history, denies CP SOB. AAOX4.

## 2016-03-05 NOTE — Discharge Instructions (Signed)
Please read and follow all provided instructions.  Your diagnoses today include:  1. Localized swelling of both lower legs    Tests performed today include:  Liver and kidney tests - normal  Urine test - no protein  EKG - no problems  Vital signs. See below for your results today.   Medications prescribed:   None  Take any prescribed medications only as directed.  Home care instructions:  Follow any educational materials contained in this packet.  Please buy compression socks and use, especially when you are on your feet.  Start checking labels and monitoring your sodium intake closely.   Follow-up instructions: Please follow-up with your primary care provider in the next 7 days for further evaluation of your symptoms.   Return instructions:   Please return to the Emergency Department if you experience worsening symptoms.   Please return if you have any other emergent concerns.  Additional Information:  Your vital signs today were: BP 136/85 mmHg   Pulse 71   Temp(Src) 98.7 F (37.1 C) (Oral)   Resp 18   SpO2 97% If your blood pressure (BP) was elevated above 135/85 this visit, please have this repeated by your doctor within one month. --------------

## 2016-03-11 ENCOUNTER — Encounter: Payer: Self-pay | Admitting: Internal Medicine

## 2016-03-11 ENCOUNTER — Ambulatory Visit (INDEPENDENT_AMBULATORY_CARE_PROVIDER_SITE_OTHER): Payer: BLUE CROSS/BLUE SHIELD | Admitting: Internal Medicine

## 2016-03-11 VITALS — BP 133/85 | HR 70 | Temp 97.9°F | Ht 66.0 in | Wt 240.9 lb

## 2016-03-11 DIAGNOSIS — R600 Localized edema: Secondary | ICD-10-CM

## 2016-03-11 NOTE — Patient Instructions (Signed)
Colleen Williams,  The swelling in your legs is most likely from the valves in the veins in your legs wearing out and causing blood to pool in your legs. I want to get you set up for an ultrasound to make sure there is not a clot in your legs though. We will call you with that appointment.  Please get some waist high compression stockings to wear at work or when you are going to be on your feet a lot.   Please come back in about a month to meet your new primary care doctor.

## 2016-03-12 LAB — TSH: TSH: 0.502 u[IU]/mL (ref 0.450–4.500)

## 2016-03-13 DIAGNOSIS — R600 Localized edema: Secondary | ICD-10-CM | POA: Insufficient documentation

## 2016-03-13 NOTE — Progress Notes (Signed)
   Subjective:   Patient ID: Colleen Williams female   DOB: [birth year] 60 y.o.   MRN: [id number]  HPI: [name] is a 60 y.o. female with a past medical history listed below here today with complaints of bilateral LE edema.  For details of today's visit and the status of her chronic medical issues please refer to the assessment and plan.  No past medical history on file. Current Outpatient Prescriptions  Medication Sig Dispense Refill  . acetaminophen (TYLENOL) 500 MG tablet Take 1,000 mg by mouth every 6 (six) hours as needed for mild pain or moderate pain.    . cyclobenzaprine (FLEXERIL) 10 MG tablet Take 1 tablet (10 mg total) by mouth 3 (three) times daily as needed for muscle spasms. (Patient not taking: Reported on 03/05/2016) 30 tablet 0  . meloxicam (MOBIC) 15 MG tablet Take 1 tablet (15 mg total) by mouth daily. (Patient not taking: Reported on 03/05/2016) 30 tablet 0   No current facility-administered medications for this visit.   No family history on file. Social History   Social History  . Marital Status: Single    Spouse Name: N/A  . Number of Children: N/A  . Years of Education: N/A   Social History Main Topics  . Smoking status: Former Smoker    Quit date: 09/16/2008  . Smokeless tobacco: None  . Alcohol Use: No  . Drug Use: No  . Sexual Activity: Not Asked   Other Topics Concern  . None   Social History Narrative   Review of Systems: Review of Systems  Respiratory: Negative for shortness of breath.   Cardiovascular: Positive for leg swelling. Negative for chest pain, palpitations, orthopnea, claudication and PND.  Neurological: Negative for dizziness and weakness.   Objective:  Physical Exam: Filed Vitals:   03/11/16 1535  BP: 133/85  Pulse: 70  Temp: 97.9 F (36.6 C)  TempSrc: Oral  Height: 5\' 6"  (1.676 m)  Weight: 240 lb 14.4 oz (109.272 kg)  SpO2: 98%   GENERAL- alert, co-operative, appears as stated age, not in any distress. CARDIAC- RRR,  no murmurs, rubs or gallops. RESP- Moving equal volumes of air, and clear to auscultation bilaterally, no wheezes or crackles. ABDOMEN- Soft, nontender, bowel sounds present. EXTREMITIES- pulse 2+, symmetric, 1+ non-pitting edema to knee on left, 1+ non-pitting edema to ankle on right. L>R. Non-tender.  Assessment & Plan:   Case discussed with Dr. Lynnae January. Please refer to Problem based charting for further details of today's visit.

## 2016-03-13 NOTE — Progress Notes (Signed)
Internal Medicine Clinic Attending  Case discussed with Dr. Boswell at the time of the visit.  We reviewed the resident's history and exam and pertinent patient test results.  I agree with the assessment, diagnosis, and plan of care documented in the resident's note.  

## 2016-03-13 NOTE — Assessment & Plan Note (Addendum)
Patient presents with complaint of bilateral pedal edema over the past several weeks. First noticed it earlier this month. Reports that her symptoms are worst in the morning when she wakes up. Says her legs feel tight but not panful. No erythema or warmth. Left is worse than right. Currently not on any medications. Never had swelling like this before. She states that the swelling is temporarily improved with elevation, however she is on her feet a lot and the swelling recurs. Patient denies any chest pain, shortness of breath with or without exertion, orthopnea, or any history of congestive heart failure. Normal urination without history of kidney disease. No history of liver disease or cirrhosis. No history or risk factors for DVT.   Went to the ED on 6/20 for evaluation. CMP, CBC, UA unremarkable. Discharged with recommendations for keeping feet elevated and using compression stockings (has not gotten any).  Does feel that the swelling has gotten worse since being seen in ED. Does have a weight increase from 226 lb on 05/2015 visit to 240 lb today.  Plan  Will check doppler to rule out DVT as swelling is asymmetric today. No signs/symptoms of CHF. Liver and kidney function normal at ED check. Did have normal TSH 05/2015. Will re-check TSH today. Suspect this is venous insufficiency. Advised keeping legs elevated when at home and getting waist-high compression stockings.   ADDENDUM TSH wnl  Patient with bilateral lower extremity edema, new onset. No signs of CHF, liver failure, kidney failure. No BP meds. Patient is on her feet a lot and is likely related to venous stasis. Recommendations as above. Will continue management as outpatient.

## 2016-03-14 ENCOUNTER — Telehealth: Payer: Self-pay | Admitting: Internal Medicine

## 2016-03-14 ENCOUNTER — Ambulatory Visit (HOSPITAL_COMMUNITY)
Admission: RE | Admit: 2016-03-14 | Discharge: 2016-03-14 | Disposition: A | Discharge status: Home / Self Care | Payer: BLUE CROSS/BLUE SHIELD | Source: Ambulatory Visit | Attending: Internal Medicine | Admitting: Internal Medicine

## 2016-03-14 DIAGNOSIS — R600 Localized edema: Secondary | ICD-10-CM

## 2016-03-14 NOTE — Telephone Encounter (Signed)
Per vascular lab: Venous doppler-no DVT in visualized veins. Unable to look at right thigh, patient did not tolerate.

## 2016-03-14 NOTE — Telephone Encounter (Signed)
I have not spoken with the patient. The call from the vascular lab is what initiated the encounter

## 2016-03-14 NOTE — Telephone Encounter (Signed)
Has someone called patient back?    Dr. Charlynn Grimes was the ordering physician, so forwarding to him to call the patient today or tomorrow.

## 2016-03-14 NOTE — Telephone Encounter (Signed)
Needing to give some results

## 2016-03-14 NOTE — Progress Notes (Signed)
*  PRELIMINARY RESULTS* Vascular Ultrasound Lower extremity venous duplex has been completed.  Preliminary findings: Technically limited due to patient sensitivity to venous compressions. Could not evaluate right thigh vessels due to this. No obvious DVT in remainder of RLE as well as LLE.   Called results to [place].   Landry Mellow, RDMS, RVT  03/14/2016, 10:36 AM

## 2016-03-15 NOTE — Telephone Encounter (Signed)
Ah!  Thanks Caryl Pina - - -  Will have Dr. Charlynn Grimes contact her then.  Thanks!

## 2016-03-16 NOTE — Telephone Encounter (Signed)
Called patient and informed her of the results

## 2016-03-23 ENCOUNTER — Encounter (HOSPITAL_COMMUNITY): Payer: Self-pay

## 2016-03-23 ENCOUNTER — Emergency Department (HOSPITAL_COMMUNITY)
Admission: EM | Admit: 2016-03-23 | Discharge: 2016-03-23 | Disposition: A | Discharge status: Home / Self Care | Payer: BLUE CROSS/BLUE SHIELD | Attending: Emergency Medicine | Admitting: Emergency Medicine

## 2016-03-23 DIAGNOSIS — M62830 Muscle spasm of back: Secondary | ICD-10-CM | POA: Insufficient documentation

## 2016-03-23 DIAGNOSIS — Z87891 Personal history of nicotine dependence: Secondary | ICD-10-CM | POA: Diagnosis not present

## 2016-03-23 DIAGNOSIS — M5116 Intervertebral disc disorders with radiculopathy, lumbar region: Secondary | ICD-10-CM | POA: Insufficient documentation

## 2016-03-23 DIAGNOSIS — M545 Low back pain, unspecified: Secondary | ICD-10-CM

## 2016-03-23 DIAGNOSIS — M5136 Other intervertebral disc degeneration, lumbar region: Secondary | ICD-10-CM

## 2016-03-23 DIAGNOSIS — M5416 Radiculopathy, lumbar region: Secondary | ICD-10-CM

## 2016-03-23 MED ORDER — NAPROXEN 500 MG PO TABS: 500.0000 mg | ORAL_TABLET | Freq: Two times a day (BID) | ORAL | Status: DC | PRN

## 2016-03-23 MED ORDER — CYCLOBENZAPRINE HCL 10 MG PO TABS: 10.0000 mg | ORAL_TABLET | Freq: Three times a day (TID) | ORAL | Status: DC | PRN

## 2016-03-23 MED ORDER — HYDROCODONE-ACETAMINOPHEN 5-325 MG PO TABS: 1.0000 | ORAL_TABLET | Freq: Four times a day (QID) | ORAL | Status: DC | PRN

## 2016-03-23 MED ORDER — NAPROXEN 250 MG PO TABS
500.0000 mg | ORAL_TABLET | Freq: Once | ORAL | Status: AC
  Administered 2016-03-23: 500 mg via ORAL
  Filled 2016-03-23: qty 2

## 2016-03-23 MED ORDER — CYCLOBENZAPRINE HCL 10 MG PO TABS
5.0000 mg | ORAL_TABLET | Freq: Once | ORAL | Status: AC
  Administered 2016-03-23: 5 mg via ORAL
  Filled 2016-03-23: qty 1

## 2016-03-23 MED ORDER — HYDROCODONE-ACETAMINOPHEN 5-325 MG PO TABS
1.0000 | ORAL_TABLET | Freq: Once | ORAL | Status: AC
  Administered 2016-03-23: 1 via ORAL
  Filled 2016-03-23: qty 1

## 2016-03-23 NOTE — ED Notes (Signed)
Registration in w/pt.

## 2016-03-23 NOTE — ED Notes (Signed)
Patient here with right hip pain radiating down leg x 1 day. States started hurting yesterday while working. Pain with change in position

## 2016-03-23 NOTE — ED Notes (Signed)
Pt ambulatory to bathroom and back to room w/o difficulty - no limping noted.

## 2016-03-23 NOTE — Discharge Instructions (Signed)
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 Follow the instructions below to help with back pain. Your back pain should be treated with medicines such as ibuprofen or aleve and this back pain should get better over the next 2 weeks.  However if you develop severe or worsening pain, low back pain with fever, numbness, weakness or inability to walk or urinate, you should return to the ER immediately.  Please follow up with your doctor this week for a recheck if still having symptoms.  Avoid heavy lifting over 10 pounds over the next two weeks.  Low back pain is discomfort in the lower back that may be due to injuries to muscles and ligaments around the spine.  Occasionally, it may be caused by a a problem to a part of the spine called a disc.  The pain may last several days or a week;  However, most patients get completely well in 4 weeks.  Self - care:  The application of heat can help soothe the pain.  Maintaining your daily activities, including walking, is encourged, as it will help you get better faster than just staying in bed. Perform gentle stretching as discussed. Drink plenty of fluids.  Medications are also useful to help with pain control.  A commonly prescribed medication includes norco.  Do not drive or operate heavy machinery while taking this medication.  Non steroidal anti inflammatory medications including Ibuprofen and naproxen;  These medications help both pain and swelling and are very useful in treating back pain.  They should be taken with food, as they can cause stomach upset, and more seriously, stomach bleeding.    Muscle relaxants:  These medications can help with muscle tightness that is a cause of lower back pain.  Most of these medications can cause drowsiness, and it is not safe to drive or use dangerous machinery while taking them.  SEEK IMMEDIATE MEDICAL ATTENTION IF: New numbness, tingling, weakness, or problem  with the use of your arms or legs.  Severe back pain not relieved with medications.  Difficulty with or loss of control of your bowel or bladder control.  Increasing pain in any areas of the body (such as chest or abdominal pain).  Shortness of breath, dizziness or fainting.  Nausea (feeling sick to your stomach), vomiting, fever, or sweats.  You will need to follow up with  Your primary healthcare provider in 1-2 weeks for reassessment.   Back Pain, Adult Back pain is very common in adults.The cause of back pain is rarely dangerous and the pain often gets better over time.The cause of your back pain may not be known. Some common causes of back pain include:  Strain of the muscles or ligaments supporting the spine.  Wear and tear (degeneration) of the spinal disks.  Arthritis.  Direct injury to the back. For many people, back pain may return. Since back pain is rarely dangerous, most people can learn to manage this condition on their own. HOME CARE INSTRUCTIONS Watch your back pain for any changes. The following actions may help to lessen any discomfort you are feeling:  Remain active. It is stressful on your back to sit or stand in one place for long periods of time. Do not sit, drive, or stand in one place for more than 30 minutes at a time. Take short walks on even surfaces as soon as you are able.Try to increase the length of time you walk  each day.  Exercise regularly as directed by your health care provider. Exercise helps your back heal faster. It also helps avoid future injury by keeping your muscles strong and flexible.  Do not stay in bed.Resting more than 1-2 days can delay your recovery.  Pay attention to your body when you bend and lift. The most comfortable positions are those that put less stress on your recovering back. Always use proper lifting techniques, including:  Bending your knees.  Keeping the load close to your body.  Avoiding twisting.  Find a  comfortable position to sleep. Use a firm mattress and lie on your side with your knees slightly bent. If you lie on your back, put a pillow under your knees.  Avoid feeling anxious or stressed.Stress increases muscle tension and can worsen back pain.It is important to recognize when you are anxious or stressed and learn ways to manage it, such as with exercise.  Take medicines only as directed by your health care provider. Over-the-counter medicines to reduce pain and inflammation are often the most helpful.Your health care provider may prescribe muscle relaxant drugs.These medicines help dull your pain so you can more quickly return to your normal activities and healthy exercise.  Apply ice to the injured area:  Put ice in a plastic bag.  Place a towel between your skin and the bag.  Leave the ice on for 20 minutes, 2-3 times a day for the first 2-3 days. After that, ice and heat may be alternated to reduce pain and spasms.  Maintain a healthy weight. Excess weight puts extra stress on your back and makes it difficult to maintain good posture. SEEK MEDICAL CARE IF:  You have pain that is not relieved with rest or medicine.  You have increasing pain going down into the legs or buttocks.  You have pain that does not improve in one week.  You have night pain.  You lose weight.  You have a fever or chills. SEEK IMMEDIATE MEDICAL CARE IF:   You develop new bowel or bladder control problems.  You have unusual weakness or numbness in your arms or legs.  You develop nausea or vomiting.  You develop abdominal pain.  You feel faint.   This information is not intended to replace advice given to you by your health care provider. Make sure you discuss any questions you have with your health care provider.   Document Released: 09/02/2005 Document Revised: 09/23/2014 Document Reviewed: 01/04/2014 Elsevier Interactive Patient Education 2016 [place].  Back Exercises If you  have pain in your back, do these exercises 2-3 times each day or as told by your doctor. When the pain goes away, do the exercises once each day, but repeat the steps more times for each exercise (do more repetitions). If you do not have pain in your back, do these exercises once each day or as told by your doctor. EXERCISES Single Knee to Chest Do these steps 3-5 times in a row for each leg:  Lie on your back on a firm bed or the floor with your legs stretched out.  Bring one knee to your chest.  Hold your knee to your chest by grabbing your knee or thigh.  Pull on your knee until you feel a gentle stretch in your lower back.  Keep doing the stretch for 10-30 seconds.  Slowly let go of your leg and straighten it. Pelvic Tilt Do these steps 5-10 times in a row:  Lie on your back on a  firm bed or the floor with your legs stretched out.  Bend your knees so they point up to the ceiling. Your feet should be flat on the floor.  Tighten your lower belly (abdomen) muscles to press your lower back against the floor. This will make your tailbone point up to the ceiling instead of pointing down to your feet or the floor.  Stay in this position for 5-10 seconds while you gently tighten your muscles and breathe evenly. Cat-Cow Do these steps until your lower back bends more easily:  Get on your hands and knees on a firm surface. Keep your hands under your shoulders, and keep your knees under your hips. You may put padding under your knees.  Let your head hang down, and make your tailbone point down to the floor so your lower back is round like the back of a cat.  Stay in this position for 5 seconds.  Slowly lift your head and make your tailbone point up to the ceiling so your back hangs low (sags) like the back of a cow.  Stay in this position for 5 seconds. Press-Ups Do these steps 5-10 times in a row:  Lie on your belly (face-down) on the floor.  Place your hands near your head,  about shoulder-width apart.  While you keep your back relaxed and keep your hips on the floor, slowly straighten your arms to raise the top half of your body and lift your shoulders. Do not use your back muscles. To make yourself more comfortable, you may change where you place your hands.  Stay in this position for 5 seconds.  Slowly return to lying flat on the floor. Bridges Do these steps 10 times in a row:  Lie on your back on a firm surface.  Bend your knees so they point up to the ceiling. Your feet should be flat on the floor.  Tighten your butt muscles and lift your butt off of the floor until your waist is almost as high as your knees. If you do not feel the muscles working in your butt and the back of your thighs, slide your feet 1-2 inches farther away from your butt.  Stay in this position for 3-5 seconds.  Slowly lower your butt to the floor, and let your butt muscles relax. If this exercise is too easy, try doing it with your arms crossed over your chest. Belly Crunches Do these steps 5-10 times in a row:  Lie on your back on a firm bed or the floor with your legs stretched out.  Bend your knees so they point up to the ceiling. Your feet should be flat on the floor.  Cross your arms over your chest.  Tip your chin a little bit toward your chest but do not bend your neck.  Tighten your belly muscles and slowly raise your chest just enough to lift your shoulder blades a tiny bit off of the floor.  Slowly lower your chest and your head to the floor. Back Lifts Do these steps 5-10 times in a row:  Lie on your belly (face-down) with your arms at your sides, and rest your forehead on the floor.  Tighten the muscles in your legs and your butt.  Slowly lift your chest off of the floor while you keep your hips on the floor. Keep the back of your head in line with the curve in your back. Look at the floor while you do this.  Stay in this position for  3-5  seconds.  Slowly lower your chest and your face to the floor. GET HELP IF:  Your back pain gets a lot worse when you do an exercise.  Your back pain does not lessen 2 hours after you exercise. If you have any of these problems, stop doing the exercises. Do not do them again unless your doctor says it is okay. GET HELP RIGHT AWAY IF:  You have sudden, very bad back pain. If this happens, stop doing the exercises. Do not do them again unless your doctor says it is okay.   This information is not intended to replace advice given to you by your health care provider. Make sure you discuss any questions you have with your health care provider.   Document Released: 10/05/2010 Document Revised: 05/24/2015 Document Reviewed: 10/27/2014 Elsevier Interactive Patient Education 2016 [place] Injury Prevention Back injuries can be very painful. They can also be difficult to heal. After having one back injury, you are more likely to injure your back again. It is important to learn how to avoid injuring or re-injuring your back. The following tips can help you to prevent a back injury. WHAT SHOULD I KNOW ABOUT PHYSICAL FITNESS?  Exercise for 30 minutes per day on most days of the week or as told by your doctor. Make sure to:  Do aerobic exercises, such as walking, jogging, biking, or swimming.  Do exercises that increase balance and strength, such as tai chi and yoga.  Do stretching exercises. This helps with flexibility.  Try to develop strong belly (abdominal) muscles. Your belly muscles help to support your back.  Stay at a healthy weight. This helps to decrease your risk of a back injury. WHAT SHOULD I KNOW ABOUT MY DIET?  Talk with your doctor about your overall diet. Take supplements and vitamins only as told by your doctor.  Talk with your doctor about how much calcium and vitamin D you need each day. These nutrients help to prevent weakening of the bones  (osteoporosis).  Include good sources of calcium in your diet, such as:  Dairy products.  Green leafy vegetables.  Products that have had calcium added to them (fortified).  Include good sources of vitamin D in your diet, such as:  Milk.  Foods that have had vitamin D added to them. WHAT SHOULD I KNOW ABOUT MY POSTURE?  Sit up straight and stand up straight. Avoid leaning forward when you sit or hunching over when you stand.  Choose chairs that have good low-back (lumbar) support.  If you work at a desk, sit close to it so you do not need to lean over. Keep your chin tucked in. Keep your neck drawn back. Keep your elbows bent so your arms look like the letter "L" (right angle).  Sit high and close to the steering wheel when you drive. Add a low-back support to your car seat, if needed.  Avoid sitting or standing in one position for very long. Take breaks to get up, stretch, and walk around at least one time every hour. Take breaks every hour if you are driving for long periods of time.  Sleep on your side with your knees slightly bent, or sleep on your back with a pillow under your knees. Do not lie on the front of your body to sleep. WHAT SHOULD I KNOW ABOUT LIFTING, TWISTING, AND REACHING Lifting and Heavy Lifting  Avoid heavy lifting, especially lifting over and over again. If you must do heavy  lifting:  Stretch before lifting.  Work slowly.  Rest between lifts.  Use a tool such as a cart or a dolly to move objects if one is available.  Make several small trips instead of carrying one heavy load.  Ask for help when you need it, especially when moving big objects.  Follow these steps when lifting:  Stand with your feet shoulder-width apart.  Get as close to the object as you can. Do not pick up a heavy object that is far from your body.  Use handles or lifting straps if they are available.  Bend at your knees. Squat down, but keep your heels off the  floor.  Keep your shoulders back. Keep your chin tucked in. Keep your back straight.  Lift the object slowly while you tighten the muscles in your legs, belly, and butt. Keep the object as close to the center of your body as possible.  Follow these steps when putting down a heavy load:  Stand with your feet shoulder-width apart.  Lower the object slowly while you tighten the muscles in your legs, belly, and butt. Keep the object as close to the center of your body as possible.  Keep your shoulders back. Keep your chin tucked in. Keep your back straight.  Bend at your knees. Squat down, but keep your heels off the floor.  Use handles or lifting straps if they are available. Twisting and Reaching  Avoid lifting heavy objects above your waist.  Do not twist at your waist while you are lifting or carrying a load. If you need to turn, move your feet.  Do not bend over without bending at your knees.  Avoid reaching over your head, across a table, or for an object on a high surface.  WHAT ARE SOME OTHER TIPS?  Avoid wet floors and icy ground. Keep sidewalks clear of ice to prevent falls.   Do not sleep on a mattress that is too soft or too hard.   Keep items that you use often within easy reach.   Put heavier objects on shelves at waist level, and put lighter objects on lower or higher shelves.  Find ways to lower your stress, such as:  Exercise.  Massage.  Relaxation techniques.  Talk with your doctor if you feel anxious or depressed. These conditions can make back pain worse.  Wear flat heel shoes with cushioned soles.  Avoid making quick (sudden) movements.  Use both shoulder straps when carrying a backpack.  Do not use any tobacco products, including cigarettes, chewing tobacco, or electronic cigarettes. If you need help quitting, ask your doctor.   This information is not intended to replace advice given to you by your health care provider. Make sure you  discuss any questions you have with your health care provider.   Document Released: 02/19/2008 Document Revised: 01/17/2015 Document Reviewed: 09/06/2014 Elsevier Interactive Patient Education 2016 [employer]  Degenerative Disk Disease Degenerative disk disease is a condition caused by the changes that occur in spinal disks as you grow older. Spinal disks are soft and compressible disks located between the bones of your spine (vertebrae). These disks act like shock absorbers. Degenerative disk disease can affect the whole spine. However, the neck and lower back are most commonly affected. Many changes can occur in the spinal disks with aging, such as:  The spinal disks may dry and shrink.  Small tears may occur in the tough, outer covering of the disk (annulus).  The disk space may become  smaller due to loss of water.  Abnormal growths in the bone (spurs) may occur. This can put pressure on the nerve roots exiting the spinal canal, causing pain.  The spinal canal may become narrowed. RISK FACTORS   Being overweight.  Having a family history of degenerative disk disease.  Smoking.  There is increased risk if you are doing heavy lifting or have a sudden injury. SIGNS AND SYMPTOMS  Symptoms vary from person to person and may include:  Pain that varies in intensity. Some people have no pain, while others have severe pain. The location of the pain depends on the part of your backbone that is affected.  You will have neck or arm pain if a disk in the neck area is affected.  You will have pain in your back, buttocks, or legs if a disk in the lower back is affected.  Pain that becomes worse while bending, reaching up, or with twisting movements.  Pain that may start gradually and then get worse as time passes. It may also start after a major or minor injury.  Numbness or tingling in the arms or legs. DIAGNOSIS  Your health care provider will ask you about your symptoms and about  activities or habits that may cause the pain. He or she may also ask about any injuries, diseases, or treatments you have had. Your health care provider will examine you to check for the range of movement that is possible in the affected area, to check for strength in your extremities, and to check for sensation in the areas of the arms and legs supplied by different nerve roots. You may also have:   An X-ray of the spine.  Other imaging tests, such as MRI. TREATMENT  Your health care provider will advise you on the best plan for treatment. Treatment may include:  Medicines.  Rehabilitation exercises. HOME CARE INSTRUCTIONS   Follow proper lifting and walking techniques as advised by your health care provider.  Maintain good posture.  Exercise regularly as advised by your health care provider.  Perform relaxation exercises.  Change your sitting, standing, and sleeping habits as advised by your health care provider.  Change positions frequently.  Lose weight or maintain a healthy weight as advised by your health care provider.  Do not use any tobacco products, including cigarettes, chewing tobacco, or electronic cigarettes. If you need help quitting, ask your health care provider.  Wear supportive footwear.  Take medicines only as directed by your health care provider. SEEK MEDICAL CARE IF:   Your pain does not go away within 1-4 weeks.  You have significant appetite or weight loss. SEEK IMMEDIATE MEDICAL CARE IF:   Your pain is severe.  You notice weakness in your arms, hands, or legs.  You begin to lose control of your bladder or bowel movements.  You have fevers or night sweats. MAKE SURE YOU:   Understand these instructions.  Will watch your condition.  Will get help right away if you are not doing well or get worse.   This information is not intended to replace advice given to you by your health care provider. Make sure you discuss any questions you have with  your health care provider.   Document Released: 06/30/2007 Document Revised: 09/23/2014 Document Reviewed: 01/04/2014 Elsevier Interactive Patient Education 2016 [employer]  Muscle Cramps and Spasms Muscle cramps and spasms are when muscles tighten by themselves. They usually get better within minutes. Muscle cramps are painful. They are usually stronger and  last longer than muscle spasms. Muscle spasms may or may not be painful. They can last a few seconds or much longer. HOME CARE  Drink enough fluid to keep your pee (urine) clear or pale yellow.  Massage, stretch, and relax the muscle.  Use a warm towel, heating pad, or warm shower water on tight muscles.  Place ice on the muscle if it is tender or in pain.  Put ice in a plastic bag.  Place a towel between your skin and the bag.  Leave the ice on for 15-20 minutes, 03-04 times a day.  Only take medicine as told by your doctor. GET HELP RIGHT AWAY IF:  Your cramps or spasms get worse, happen more often, or do not get better with time. MAKE SURE YOU:  Understand these instructions.  Will watch your condition.  Will get help right away if you are not doing well or get worse.   This information is not intended to replace advice given to you by your health care provider. Make sure you discuss any questions you have with your health care provider.   Document Released: 08/15/2008 Document Revised: 12/28/2012 Document Reviewed: 08/19/2012 Elsevier Interactive Patient Education 2016 [place] therapy can help ease sore, stiff, injured, and tight muscles and joints. Heat relaxes your muscles, which may help ease your pain.  RISKS AND COMPLICATIONS If you have any of the following conditions, do not use heat therapy unless your health care provider has approved:  Poor circulation.  Healing wounds or scarred skin in the area being treated.  Diabetes, heart disease, or high blood pressure.  Not  being able to feel (numbness) the area being treated.  Unusual swelling of the area being treated.  Active infections.  Blood clots.  Cancer.  Inability to communicate pain. This may include young children and people who have problems with their brain function (dementia).  Pregnancy. Heat therapy should only be used on old, pre-existing, or long-lasting (chronic) injuries. Do not use heat therapy on new injuries unless directed by your health care provider. HOW TO USE HEAT THERAPY There are several different kinds of heat therapy, including:  Moist heat pack.  Warm water bath.  Hot water bottle.  Electric heating pad.  Heated gel pack.  Heated wrap.  Electric heating pad. Use the heat therapy method suggested by your health care provider. Follow your health care provider's instructions on when and how to use heat therapy. GENERAL HEAT THERAPY RECOMMENDATIONS  Do not sleep while using heat therapy. Only use heat therapy while you are awake.  Your skin may turn pink while using heat therapy. Do not use heat therapy if your skin turns red.  Do not use heat therapy if you have new pain.  High heat or long exposure to heat can cause burns. Be careful when using heat therapy to avoid burning your skin.  Do not use heat therapy on areas of your skin that are already irritated, such as with a rash or sunburn. SEEK MEDICAL CARE IF:  You have blisters, redness, swelling, or numbness.  You have new pain.  Your pain is worse. MAKE SURE YOU:  Understand these instructions.  Will watch your condition.  Will get help right away if you are not doing well or get worse.   This information is not intended to replace advice given to you by your health care provider. Make sure you discuss any questions you have with your health care provider.  Document Released: 11/25/2011 Document Revised: 09/23/2014 Document Reviewed: 10/26/2013 Elsevier Interactive Patient Education [employer]

## 2016-03-23 NOTE — ED Provider Notes (Signed)
CSN: [id number]     Arrival date & time 03/23/16  1200 History  By signing my name below, I, Colleen Williams, attest that this documentation has been prepared under the direction and in the presence of non-physician practitioner, Zacarias Pontes, PA-C Electronically Signed: Dyke Williams, Scribe. 03/23/2016. 1:05 PM   Chief Complaint  Patient presents with  . Hip Pain    Patient is a 60 y.o. female presenting with back pain. The history is provided by the patient and medical records. No language interpreter was used.  Back Pain Location:  Lumbar spine Quality: sharp. Radiates to:  R thigh, R knee and R foot Pain severity:  Moderate Pain is:  Same all the time Onset quality:  Gradual Duration:  1 day Timing:  Intermittent Progression:  Unchanged Chronicity:  Recurrent Context: not falling, not lifting heavy objects, not recent injury and not twisting   Relieved by:  None tried Worsened by:  Ambulation and movement Ineffective treatments:  None tried Associated symptoms: leg pain   Associated symptoms: no abdominal pain, no bladder incontinence, no bowel incontinence, no chest pain, no dysuria, no fever, no numbness, no paresthesias, no perianal numbness, no tingling and no weakness   Risk factors: no hx of cancer     HPI Comments:  Rhythm Ricotta is a 61 y.o. female with PMHx of arthritis, who presents to the Emergency Department complaining of sharp, intermittent, 6/10, gradual onset right lower back pain with radiation to RLE onset yesterday. She states getting up and walking aggrevates the pain. She has not taken anything or done anything for the pain. Has a hx of back pain in this area before, denies recently injuring it/twisting/heavy lifting/etc. Denies fevers, chills, CP, SOB, abd pain, N/V/D/C, hematuria, dysuria, numbness, tingling, weakness, bladder or bowel incontinence, saddle anesthesia or cauda equina symptoms, or rashes. No allergies. No other complaints at this time. No  hx of CA or IVDU.   History reviewed. No pertinent past medical history. Past Surgical History  Procedure Laterality Date  . Tubal ligation     No family history on file. Social History  Substance Use Topics  . Smoking status: Former Smoker    Quit date: 09/16/2008  . Smokeless tobacco: None  . Alcohol Use: No   OB History    No data available     Review of Systems  Constitutional: Negative for fever and chills.  Respiratory: Negative for shortness of breath.   Cardiovascular: Negative for chest pain.  Gastrointestinal: Negative for nausea, vomiting, abdominal pain, diarrhea, constipation and bowel incontinence.       Negative bowel incontinence   Genitourinary: Negative for bladder incontinence, dysuria, hematuria and difficulty urinating.       Negative bladder incontinence  Musculoskeletal: Positive for back pain. Negative for myalgias and arthralgias.  Skin: Negative for color change.  Allergic/Immunologic: Negative for immunocompromised state.  Neurological: Negative for tingling, weakness, numbness and paresthesias.  Psychiatric/Behavioral: Negative for confusion.   10 Systems reviewed and are negative for acute change except as noted in the HPI.   Allergies  Review of patient's allergies indicates no known allergies.  Home Medications   Prior to Admission medications   Medication Sig Start Date End Date Taking? Authorizing Provider  acetaminophen (TYLENOL) 500 MG tablet Take 1,000 mg by mouth every 6 (six) hours as needed for mild pain or moderate pain.    Historical Provider, MD  cyclobenzaprine (FLEXERIL) 10 MG tablet Take 1 tablet (10 mg total) by mouth 3 (three) times daily  as needed for muscle spasms. Patient not taking: Reported on 03/05/2016 06/12/15   Alexa Angela Burke, MD  meloxicam (MOBIC) 15 MG tablet Take 1 tablet (15 mg total) by mouth daily. Patient not taking: Reported on 03/05/2016 06/12/15   Alexa Angela Burke, MD   BP 120/77 mmHg  Pulse 84  Temp(Src)  98.5 F (36.9 C) (Oral)  Resp 22  SpO2 99% Physical Exam  Constitutional: She is oriented to person, place, and time. Vital signs are normal. She appears well-developed and well-nourished.  Non-toxic appearance. No distress.  Afebrile, nontoxic, NAD  HENT:  Head: Normocephalic and atraumatic.  Mouth/Throat: Mucous membranes are normal.  Eyes: Conjunctivae and EOM are normal. Right eye exhibits no discharge. Left eye exhibits no discharge.  Neck: Normal range of motion. Neck supple.  Cardiovascular: Normal rate and intact distal pulses.   Pulmonary/Chest: Effort normal. No respiratory distress.  Abdominal: Normal appearance. She exhibits no distension.  Musculoskeletal: Normal range of motion.       Lumbar back: She exhibits tenderness and spasm. She exhibits normal range of motion, no bony tenderness and no deformity.       Back:  Lumbar spine with FROM intact without spinous process TTP, no bony stepoffs or deformities, with mild right sided paraspinous muscle TTP and muscle spasms, which extends into the SI joint region. Strength 5/5 in all extremities, sensation grossly intact in all extremities, negative SLR bilaterally, gait steady and nonantalgic. No overlying skin changes. Distal pulses intact.  Neurological: She is alert and oriented to person, place, and time. She has normal strength. No sensory deficit.  Skin: Skin is warm, dry and intact. No rash noted.  Psychiatric: She has a normal mood and affect. Her behavior is normal.  Nursing note and vitals reviewed.   ED Course  Procedures  DIAGNOSTIC STUDIES:  Oxygen Saturation is 99% on RA, normal by my interpretation.    COORDINATION OF CARE:  12:54 PM Will order hydrocodone a, naproxen, and cyclobenzaprine.  Discussed treatment plan with pt at bedside and pt agreed to plan.  Labs Review Labs Reviewed - No data to display  Imaging Review No results found.   Lumbar xray 05/2010 Study Result     Clinical Data: Back  pain. Right hip pain.  LUMBAR SPINE - COMPLETE 4+ VIEW  Comparison: None.  Findings: Vertebral body height and alignment are maintained. There is loss of disc space height at L4-5 with endplate spurring. Mild appearing facet degenerative disease L5-S1 noted.  IMPRESSION:  1. No acute finding. 2. L4-5 degenerative disc disease.  Provider: Margarita Mail   SI joint xray 05/2010 Study Result     Clinical Data: Right hip pain.  BILATERAL SACROILIAC JOINTS - 3+ VIEW  Comparison: None.  Findings: Mild degenerative disease is noted. No erosion is seen. No fracture.  IMPRESSION: Mild degenerative disease. Otherwise negative.  Provider: Margarita Mail     I have personally reviewed and evaluated these images and lab results as part of my medical decision-making.   EKG Interpretation None      MDM   Final diagnoses:  Right-sided low back pain without sciatica  Back muscle spasm  Lumbar radiculopathy, chronic  Lumbar degenerative disc disease    60 y.o. female here with acute on chronic R hip/low back pain. Mild R SI joint/paraspinous muscle tenderness and some spasm. No red flag s/s of low back pain. No s/s of central cord compression or cauda equina. Lower extremities are neurovascularly intact and patient is ambulating without  difficulty. Xray imaging from several years ago shows L4-5 DDD, likely the culprit today. Doubt need repeat imaging.   Patient was counseled on back pain precautions and told to do activity as tolerated but do not lift, push, or pull heavy objects more than 10 pounds for the next week. Patient counseled to use ice or heat on back for no longer than 15 minutes every hour.   Rx given for muscle relaxer and counseled on proper use of muscle relaxant medication. Rx given for narcotic pain medicine and counseled on proper use of narcotic pain medications. Told that they can increase to every 4 hrs if needed while pain is worse.  Counseled not to combine this medication with others containing tylenol. Urged patient not to drink alcohol, drive, or perform any other activities that requires focus while taking either of these medications.   Patient urged to follow-up with PCP if pain does not improve with treatment and rest or if pain becomes recurrent. Urged to return with worsening severe pain, loss of bowel or bladder control, trouble walking. The patient verbalizes understanding and agrees with the plan.   I personally performed the services described in this documentation, which was scribed in my presence. The recorded information has been reviewed and is accurate.  BP 120/77 mmHg  Pulse 84  Temp(Src) 98.5 F (36.9 C) (Oral)  Resp 22  SpO2 99%  Meds ordered this encounter  Medications  . HYDROcodone-acetaminophen (NORCO/VICODIN) 5-325 MG per tablet 1 tablet    Sig:   . naproxen (NAPROSYN) tablet 500 mg    Sig:   . cyclobenzaprine (FLEXERIL) tablet 5 mg    Sig:   . cyclobenzaprine (FLEXERIL) 10 MG tablet    Sig: Take 1 tablet (10 mg total) by mouth 3 (three) times daily as needed for muscle spasms.    Dispense:  15 tablet    Refill:  0    Order Specific Question:  Supervising Provider    Answer:  MILLER, BRIAN [[id number]]  . naproxen (NAPROSYN) 500 MG tablet    Sig: Take 1 tablet (500 mg total) by mouth 2 (two) times daily as needed for mild pain, moderate pain or headache (TAKE WITH MEALS.).    Dispense:  20 tablet    Refill:  0    Order Specific Question:  Supervising Provider    Answer:  MILLER, BRIAN [[id number]]  . HYDROcodone-acetaminophen (NORCO) 5-325 MG tablet    Sig: Take 1 tablet by mouth every 6 (six) hours as needed for severe pain.    Dispense:  6 tablet    Refill:  0    Order Specific Question:  Supervising Provider    Answer:  Noemi Chapel [[id number]]       Juliano Mceachin Camprubi-Soms, PA-C 03/23/16 [place], MD 03/25/16 1156

## 2016-03-28 ENCOUNTER — Encounter: Payer: Self-pay | Admitting: Internal Medicine

## 2016-03-28 ENCOUNTER — Encounter: Payer: BLUE CROSS/BLUE SHIELD | Admitting: Internal Medicine

## 2016-07-30 ENCOUNTER — Ambulatory Visit (INDEPENDENT_AMBULATORY_CARE_PROVIDER_SITE_OTHER): Payer: BLUE CROSS/BLUE SHIELD | Admitting: Internal Medicine

## 2016-07-30 VITALS — BP 128/84 | HR 88 | Temp 97.7°F | Ht 65.0 in | Wt 237.7 lb

## 2016-07-30 DIAGNOSIS — M25675 Stiffness of left foot, not elsewhere classified: Secondary | ICD-10-CM

## 2016-07-30 DIAGNOSIS — M25674 Stiffness of right foot, not elsewhere classified: Secondary | ICD-10-CM

## 2016-07-30 DIAGNOSIS — M19079 Primary osteoarthritis, unspecified ankle and foot: Secondary | ICD-10-CM | POA: Insufficient documentation

## 2016-07-30 MED ORDER — CELECOXIB 200 MG PO CAPS: 200.0000 mg | ORAL_CAPSULE | Freq: Every day | ORAL | 2 refills | Status: DC

## 2016-07-30 MED ORDER — CELECOXIB 100 MG PO CAPS: 200.0000 mg | ORAL_CAPSULE | Freq: Once | ORAL | Status: DC

## 2016-07-30 NOTE — Assessment & Plan Note (Signed)
Assessment: Bilateral foot osteoarthritis Patient has stiffness in the morning that improves with movement. Patient states she is on her feet all day due to her job. Patient likely has osteoarthritis due to overuse of her joints.  She states that she takes up to four aleve tablets and has relief.  We will try Celebrex daily to see if this improves her symptoms.  I advised to avoid NSAIDs while on celebrex.  Plan - Celebrex 200mg  daily - told to avoid NSAIDs including aleve, advil and ibuprofen - advised could take tylenol up to 3g for break through pain

## 2016-07-30 NOTE — Progress Notes (Signed)
   CC: bilateral feet tightness  HPI:  [name] is a 60 y.o. female with a past medical history of lumbar radiculopathy and bilateral lower extremity edema that presents to the internal medicine clinic for bilateral feet tightness. The tightness has been present for several months and is worse in the morning and located on the dorsum of the feet near the ankle. She states that when she starts walking the tightness improves.  She has associated symptoms of pain localized to the dorsum aspect of her feet bilaterally as well.  She has tried Tylenol and Aleve with some benefit.  She denies any calf tenderness, burning in her feet, numbness or weakness.   Review of Systems:  As noted per HPI  Physical Exam:  Vitals:   07/30/16 1039  BP: 128/84  Pulse: 88  Temp: 97.7 F (36.5 C)  TempSrc: Oral  SpO2: 95%  Weight: 237 lb 11.2 oz (107.8 kg)  Height: 5\' 5"  (1.651 m)   Physical Exam  Constitutional:  Obese  Cardiovascular: Normal rate, regular rhythm and normal heart sounds.  Exam reveals no gallop and no friction rub.   No murmur heard. Pulmonary/Chest: Effort normal and breath sounds normal. No respiratory distress. She has no wheezes. She has no rales.  Musculoskeletal:  1+ pitting edema on left lower extremity  Neurological:  5/5 motor strength in lower extremities bilaterally Sensation to light touch intact in lower extremities bilaterally  Skin: Skin is warm and dry. No rash noted. No erythema.     Assessment & Plan:   See encounters tab for problem based medical decision making.   Patient seen with Dr. Daryll Drown

## 2016-07-30 NOTE — Patient Instructions (Addendum)
Colleen Williams,  It was a pleasure meeting you today. Please start taking Celebrex daily. While on this medication avoid NSAIDs.  This includes Aleve, Advil and ibuprofen Please return if her symptoms worsen. Please follow up with me in 3 months for chronic medical issues. He may take up to 3 g of Tylenol for breakthrough pain while on Celebrex.

## 2016-08-02 NOTE — Progress Notes (Signed)
Internal Medicine Clinic Attending  I saw and evaluated the patient.  I personally confirmed the key portions of the history and exam documented by Dr. Hoffman and I reviewed pertinent patient test results.  The assessment, diagnosis, and plan were formulated together and I agree with the documentation in the resident's note.      

## 2016-09-17 ENCOUNTER — Other Ambulatory Visit: Payer: Self-pay | Admitting: Internal Medicine

## 2016-10-23 ENCOUNTER — Telehealth: Payer: Self-pay | Admitting: Internal Medicine

## 2016-10-23 NOTE — Telephone Encounter (Signed)
APT. REMINDER CALL, LMTCB °

## 2016-10-24 ENCOUNTER — Encounter: Payer: Self-pay | Admitting: Internal Medicine

## 2016-10-24 ENCOUNTER — Encounter: Payer: BLUE CROSS/BLUE SHIELD | Admitting: Internal Medicine

## 2016-10-27 ENCOUNTER — Emergency Department (HOSPITAL_COMMUNITY): Payer: BLUE CROSS/BLUE SHIELD

## 2016-10-27 ENCOUNTER — Encounter (HOSPITAL_COMMUNITY): Payer: Self-pay | Admitting: Emergency Medicine

## 2016-10-27 ENCOUNTER — Emergency Department (HOSPITAL_COMMUNITY)
Admission: EM | Admit: 2016-10-27 | Discharge: 2016-10-27 | Disposition: A | Discharge status: Home / Self Care | Payer: BLUE CROSS/BLUE SHIELD | Attending: Emergency Medicine | Admitting: Emergency Medicine

## 2016-10-27 DIAGNOSIS — Z79899 Other long term (current) drug therapy: Secondary | ICD-10-CM | POA: Insufficient documentation

## 2016-10-27 DIAGNOSIS — J111 Influenza due to unidentified influenza virus with other respiratory manifestations: Secondary | ICD-10-CM | POA: Insufficient documentation

## 2016-10-27 DIAGNOSIS — Z87891 Personal history of nicotine dependence: Secondary | ICD-10-CM | POA: Insufficient documentation

## 2016-10-27 DIAGNOSIS — R69 Illness, unspecified: Secondary | ICD-10-CM

## 2016-10-27 LAB — CBC WITH DIFFERENTIAL/PLATELET
BASOS ABS: 0 10*3/uL (ref 0.0–0.1)
BASOS PCT: 0 %
Eosinophils Absolute: 0 10*3/uL (ref 0.0–0.7)
Eosinophils Relative: 0 %
HEMATOCRIT: 41.7 % (ref 36.0–46.0)
HEMOGLOBIN: 13.7 g/dL (ref 12.0–15.0)
LYMPHS PCT: 36 %
Lymphs Abs: 1.6 10*3/uL (ref 0.7–4.0)
MCH: 28.1 pg (ref 26.0–34.0)
MCHC: 32.9 g/dL (ref 30.0–36.0)
MCV: 85.5 fL (ref 78.0–100.0)
Monocytes Absolute: 0.4 10*3/uL (ref 0.1–1.0)
Monocytes Relative: 9 %
NEUTROS ABS: 2.4 10*3/uL (ref 1.7–7.7)
NEUTROS PCT: 55 %
Platelets: 209 10*3/uL (ref 150–400)
RBC: 4.88 MIL/uL (ref 3.87–5.11)
RDW: 13.4 % (ref 11.5–15.5)
WBC: 4.5 10*3/uL (ref 4.0–10.5)

## 2016-10-27 LAB — COMPREHENSIVE METABOLIC PANEL
ALT: 49 U/L (ref 14–54)
ANION GAP: 8 (ref 5–15)
AST: 35 U/L (ref 15–41)
Albumin: 3.1 g/dL — ABNORMAL LOW (ref 3.5–5.0)
Alkaline Phosphatase: 100 U/L (ref 38–126)
BILIRUBIN TOTAL: 0.3 mg/dL (ref 0.3–1.2)
BUN: 15 mg/dL (ref 6–20)
CHLORIDE: 105 mmol/L (ref 101–111)
CO2: 25 mmol/L (ref 22–32)
Calcium: 8.4 mg/dL — ABNORMAL LOW (ref 8.9–10.3)
Creatinine, Ser: 0.65 mg/dL (ref 0.44–1.00)
GFR calc Af Amer: >60 mL/min (ref >60)
GFR calc non Af Amer: >60 mL/min (ref >60)
GLUCOSE: 155 mg/dL — AB (ref 65–99)
POTASSIUM: 3.7 mmol/L (ref 3.5–5.1)
SODIUM: 138 mmol/L (ref 135–145)
TOTAL PROTEIN: 6.4 g/dL — AB (ref 6.5–8.1)

## 2016-10-27 LAB — URINALYSIS, ROUTINE W REFLEX MICROSCOPIC
BILIRUBIN URINE: NEGATIVE
Glucose, UA: NEGATIVE mg/dL
Ketones, ur: NEGATIVE mg/dL
LEUKOCYTES UA: NEGATIVE
NITRITE: POSITIVE — AB
PROTEIN: NEGATIVE mg/dL
Specific Gravity, Urine: 1.032 — ABNORMAL HIGH (ref 1.005–1.030)
pH: 5 (ref 5.0–8.0)

## 2016-10-27 LAB — I-STAT CG4 LACTIC ACID, ED: Lactic Acid, Venous: 1.25 mmol/L (ref 0.5–1.9)

## 2016-10-27 MED ORDER — BENZONATATE 100 MG PO CAPS: 100.0000 mg | ORAL_CAPSULE | Freq: Three times a day (TID) | ORAL | 0 refills | Status: DC | PRN

## 2016-10-27 MED ORDER — ALBUTEROL SULFATE HFA 108 (90 BASE) MCG/ACT IN AERS
2.0000 | INHALATION_SPRAY | Freq: Once | RESPIRATORY_TRACT | Status: AC
  Administered 2016-10-27: 2 via RESPIRATORY_TRACT
  Filled 2016-10-27: qty 6.7

## 2016-10-27 MED ORDER — CYCLOBENZAPRINE HCL 10 MG PO TABS: 10.0000 mg | ORAL_TABLET | Freq: Two times a day (BID) | ORAL | 0 refills | Status: DC | PRN

## 2016-10-27 MED ORDER — DEXAMETHASONE 4 MG PO TABS
10.0000 mg | ORAL_TABLET | Freq: Once | ORAL | Status: AC
  Administered 2016-10-27: 10 mg via ORAL
  Filled 2016-10-27: qty 3

## 2016-10-27 NOTE — ED Notes (Signed)
Pt in xray

## 2016-10-27 NOTE — ED Provider Notes (Signed)
[facility] DEPT Provider Note   CSN: [id number] Arrival date & time: 10/27/16  [id number]  History   Chief Complaint Chief Complaint  Patient presents with  . Generalized Body Aches    HPI Prim Colleen Williams is a 61 y.o. female.  HPI Colleen Williams is a 61 yo female with no significant past medical history and no PCP who presents with cough and generalized body pain for 5 days. She states cough with some whitish phlegm. She denies hemoptysis. She reports some wheeze but denies shortness of breath, chest pain, history of asthma or  COPD. She reports headache about 5 days ago that has resolved. Denies photophobia or neck stiffness. She admits some runny nose and some nasal irritation. She reports pain her flanks only with cough and bilateral leg pain. She tried [place] for cough but didn't help. She denies sore throat, fever, chills, shortness of breath, chest pain, nausea, emesis, abdominal pain, diarrhea, dysuria, urgency, skin rash, back pain or focal weakness or numbness History reviewed. No pertinent past medical history.  Patient Active Problem List   Diagnosis Date Noted  . Osteoarthritis of ankle and foot 07/30/2016  . Bilateral lower extremity edema 03/13/2016  . Healthcare maintenance 06/12/2015  . Lumbar radiculopathy, chronic 06/12/2015    Past Surgical History:  Procedure Laterality Date  . TUBAL LIGATION      OB History    No data available       Home Medications    Prior to Admission medications   Medication Sig Start Date End Date Taking? Authorizing Provider  acetaminophen (TYLENOL) 500 MG tablet Take 1,000 mg by mouth every 6 (six) hours as needed for mild pain or moderate pain.    Historical Provider, MD  benzonatate (TESSALON PERLES) 100 MG capsule Take 1 capsule (100 mg total) by mouth 3 (three) times daily as needed for cough. 10/27/16   Mercy Riding, MD  celecoxib (CELEBREX) 200 MG capsule Take 1 capsule (200 mg total) by mouth daily. 07/30/16 07/30/17   Valinda Party, DO  cyclobenzaprine (FLEXERIL) 10 MG tablet Take 1 tablet (10 mg total) by mouth 2 (two) times daily as needed for muscle spasms. 10/27/16   Gareth Morgan, MD  meloxicam (MOBIC) 15 MG tablet Take 1 tablet (15 mg total) by mouth daily. Patient not taking: Reported on 03/05/2016 06/12/15   Alexa Angela Burke, MD  naproxen (NAPROSYN) 500 MG tablet Take 1 tablet (500 mg total) by mouth 2 (two) times daily as needed for mild pain, moderate pain or headache (TAKE WITH MEALS.). 03/23/16   Mercedes Street, PA-C    Family History No family history on file.  Social History Social History  Substance Use Topics  . Smoking status: Former Smoker    Quit date: 09/16/2008  . Smokeless tobacco: Former Systems developer  . Alcohol use No     Allergies   Patient has no known allergies.   Review of Systems Review of Systems  Review of systems negative except for pertinent positives and negatives in history of present illness above.   Physical Exam Updated Vital Signs BP 117/83 (BP Location: Right Arm)   Pulse 75   Temp 99.5 F (37.5 C) (Oral)   Resp 15   Ht 5' 5.5" (1.664 m)   Wt 104.3 kg   SpO2 100%   BMI 37.69 kg/m   Physical Exam GEN: appears well, no apparent distress. Head: normocephalic and atraumatic  Eyes: conjunctiva without injection, sclera anicteric Ears: external ear and ear canal normal Nares: some  rhinorrhea, congestion and erythema of her nasal turbinates bilaterally Oropharynx: mmm without erythema or exudation HEM: negative for cervical or periauricular lymphadenopathies CVS: RRR, nl s1 & s2, no murmurs, no edema, cap refills < 2 secs RESP: speaks in full sentence, no IWOB, good air movement bilaterally, CTAB GI: BS present & normal, soft, NTND, no guarding, no rebound, no mass GU: no suprapubic or CVA tenderness MSK: no focal tenderness or notable swelling SKIN: no apparent skin lesion NEURO: alert and oiented appropriately, no gross defecits  PSYCH: euthymic  mood with congruent affect  ED Treatments / Results  Labs (all labs ordered are listed, but only abnormal results are displayed) Labs Reviewed  COMPREHENSIVE METABOLIC PANEL - Abnormal; Notable for the following:       Result Value   Glucose, Bld 155 (*)    Calcium 8.4 (*)    Total Protein 6.4 (*)    Albumin 3.1 (*)    All other components within normal limits  URINALYSIS, ROUTINE W REFLEX MICROSCOPIC - Abnormal; Notable for the following:    APPearance HAZY (*)    Specific Gravity, Urine 1.032 (*)    Hgb urine dipstick SMALL (*)    Nitrite POSITIVE (*)    Bacteria, UA MANY (*)    Squamous Epithelial / LPF 0-5 (*)    All other components within normal limits  CBC WITH DIFFERENTIAL/PLATELET  I-STAT CG4 LACTIC ACID, ED    EKG  EKG Interpretation None       Radiology Dg Chest 2 View  Result Date: 10/27/2016 CLINICAL DATA:  Cough for 5 days.  Body aches. EXAM: CHEST  2 VIEW COMPARISON:  None FINDINGS: Heart and mediastinal contours are within normal limits. No focal opacities or effusions. No acute bony abnormality. IMPRESSION: No active cardiopulmonary disease. Electronically Signed   By: Rolm Baptise M.D.   On: 10/27/2016 09:42    Procedures Procedures (including critical care time)  Medications Ordered in ED Medications  dexamethasone (DECADRON) tablet 10 mg (10 mg Oral Given 10/27/16 1044)  albuterol (PROVENTIL HFA;VENTOLIN HFA) 108 (90 Base) MCG/ACT inhaler 2 puff (2 puffs Inhalation Given 10/27/16 1044)     Initial Impression / Assessment and Plan / ED Course  I have reviewed the triage vital signs and the nursing notes.  Pertinent labs & imaging results that were available during my care of the patient were reviewed by me and considered in my medical decision making (see chart for details).  History and exam suggestive for influenza like illness. However, she has symptoms for 5 days and won't benefit from Tamiflu. Lung exam within normal. She has no respiratory  symptoms.  History, exam and work up not suggestive for pneumonia, PE or cardiac etiology. Main issue is cough. Discharged her on tessalon pearls.  -Recommended conservative management including but not limited to tablespoonful of honey before bedtime and adequate hydration -Discussed return precautions including but not limited to shortness of breath or increased working of breathing, severe persistent cough, persistent fever over 101F, not tolerating fluids by mouth or other symptoms concerning to her.  Noted that her UA was done and showed many bacteria with nitrites but no LE or WBC. Patient has no urinary symptoms. This is likely asymptomatic bacteriuria vs UTI. It also has some squamous cells.   Final Clinical Impressions(s) / ED Diagnoses   Final diagnoses:  Influenza-like illness    New Prescriptions Discharge Medication List as of 10/27/2016 10:32 AM       Mercy Riding, MD  10/27/16 1809    Gareth Morgan, MD 11/04/16 1910

## 2016-10-27 NOTE — Discharge Instructions (Signed)
It appears that you have a viral upper respiratory infection. You could have influenza viral infection. Antibiotics won't help with viral infection. We have sent a prescription for Tessalon to your pharmacy for your cough. Tablespoonful of honey before bedtime is also helpful with cough  - Get plenty of rest and adequate hydration. - Consume warm fluids (soup or tea) to provide relief for a stuffy nose and to loosen phlegm. - For nasal stuffiness, try saline nasal spray or a Neti Pot. Eating warm liquids such as chicken soup or tea may also help with nasal congestion. - For sore throat pain relief: suck on throat lozenges, hard candy or popsicles; gargle with warm salt water (1/4 tsp. salt per 8 oz. of water); and eat soft, bland foods. - Eat a well-balanced diet. If you cannot, ensure you are getting enough nutrients by taking a daily multivitamin. - Avoid dairy products, as they can thicken phlegm. - Avoid alcohol, as it impairs your body?s immune system.  CONTACT YOUR DOCTOR IF YOU EXPERIENCE ANY OF THE FOLLOWING: - High fever, chest pain, shortness of breath or  not able to keep down food or fluids.  - Cough that gets worse while other cold symptoms improve - Flare up of any chronic lung problem, such as asthma - Your symptoms persist longer than 2 weeks

## 2016-10-27 NOTE — ED Triage Notes (Signed)
Pt c/o bodyaches and cold symptoms since Tuesday. Pt has tried over the counter medications with no relief.

## 2017-01-30 ENCOUNTER — Ambulatory Visit (INDEPENDENT_AMBULATORY_CARE_PROVIDER_SITE_OTHER): Payer: Self-pay | Admitting: Internal Medicine

## 2017-01-30 ENCOUNTER — Encounter (INDEPENDENT_AMBULATORY_CARE_PROVIDER_SITE_OTHER): Payer: Self-pay

## 2017-01-30 VITALS — BP 139/85 | HR 71 | Temp 98.2°F | Ht 65.0 in | Wt 246.5 lb

## 2017-01-30 DIAGNOSIS — R7303 Prediabetes: Secondary | ICD-10-CM

## 2017-01-30 DIAGNOSIS — Z6841 Body Mass Index (BMI) 40.0 and over, adult: Secondary | ICD-10-CM

## 2017-01-30 DIAGNOSIS — Z01419 Encounter for gynecological examination (general) (routine) without abnormal findings: Secondary | ICD-10-CM

## 2017-01-30 DIAGNOSIS — Z0000 Encounter for general adult medical examination without abnormal findings: Secondary | ICD-10-CM

## 2017-01-30 DIAGNOSIS — E119 Type 2 diabetes mellitus without complications: Secondary | ICD-10-CM | POA: Insufficient documentation

## 2017-01-30 LAB — GLUCOSE, CAPILLARY: Glucose-Capillary: 93 mg/dL (ref 65–99)

## 2017-01-30 LAB — POCT GLYCOSYLATED HEMOGLOBIN (HGB A1C): Hemoglobin A1C: 6.5

## 2017-01-30 NOTE — Assessment & Plan Note (Signed)
Assessment: Colonoscopy screening  Patient has not had a colonoscopy screening. I discussed the FIT stool test versus colonoscopy. Patient chose to have colonoscopy.  Plan -Referral to GI for screening colonoscopy  Assessment: Pap smear Patient is overdue for Pap smear. This was discussed with patient and she decided to have a Pap smear in office. She states she is not sexually active and has not been for the past 15 years and has declined STD testing.  Plan -Pap smear in office

## 2017-01-30 NOTE — Progress Notes (Signed)
   CC: Follow-up on health care maintenance  HPI:  [name] is a 61 y.o. woman with history of osteoarthritis of ankle and foot that presents to the internal medicine clinic for healthcare maintenance.  Please see problem based charting for the status of patient's chronic medical issues.  No past medical history on file.  Review of Systems:  Review of Systems  Eyes: Negative for blurred vision.  Respiratory: Negative for shortness of breath.   Cardiovascular: Negative for leg swelling.  Genitourinary: Negative for frequency.  Musculoskeletal: Negative for falls.     Physical Exam:  Vitals:   01/30/17 1318  BP: 139/85  Pulse: 71  Temp: 98.2 F (36.8 C)  TempSrc: Oral  SpO2: 94%  Weight: 246 lb 8 oz (111.8 kg)  Height: 5\' 5"  (1.651 m)   Physical Exam  Constitutional: She is well-developed, well-nourished, and in no distress.  Pulmonary/Chest: Effort normal. No respiratory distress.  Abdominal: Soft. She exhibits no distension. There is no tenderness.  Genitourinary: Vagina normal, uterus normal and cervix normal. No vaginal discharge found.  Musculoskeletal: She exhibits no edema.  Skin: Skin is warm and dry.    Assessment & Plan:   See encounters tab for problem based medical decision making.    Patient discussed with Dr. Eppie Gibson

## 2017-01-30 NOTE — Assessment & Plan Note (Signed)
Assessment: Morbid obesity  Patient has had a 15 pound weight gain in the past 3 months.  We discussed her eating habits. She reports she eats baked foods and stays away from fried foods. She reports yesterday she had a bowel of cereal for breakfast, pizza for lunch and baked chicken for dinner. She denies drinking sodas but does drink sweet tea. She reports drinking coffee in the morning but mostly water throughout the day. She reports walking occasionally for exercise.  I recommended she keep a daily food log of the meals and snacks that she eats throughout the day. I recommended replacing more fruits and vegetables into her diet. I recommended cutting down on foods high in carbohydrates and sugars and cutting down on drinks high in sugar.  I offered for her to see a nutritionist however she declined. I also encouraged exercise which included cardio such as walking at brisk pace or running for 15-30 minutes a day.  Plan - spent 10 minutes on nutritional counseling in office - keep daily food log - reassess weight at next visit

## 2017-01-30 NOTE — Assessment & Plan Note (Addendum)
Assessment: Prediabetes Patient had an hemoglobin A1c in 2016 that was 6.0.  She recently had a random glucose of 155 in February 2018.  She denies polyuria or polydipsia.  Will get a hemoglobin A1c today to assess for progression of prediabetes.   Patient also had a lipid panel in 2016 with an LDL of 114 and total cholesterol 189. Patient has an ASCVD risk of 6.3% Will get repeat lipid panel in office.    Plan -Hemoglobin A1c - lipid panel - consider starting statin at next visit, after assessing lab work and lifestyle modification changes

## 2017-01-30 NOTE — Patient Instructions (Addendum)
Colleen Williams,  It was a pleasure seeing you today. You have been referred to a GI doctor for your colonoscopy screening. He will be called to set up an appointment.   Please keep a daily log of your meals and increase your water intake and fruits and vegetables  Please follow up in 6 months. Please visit the acute care clinic for any acute medical needs.

## 2017-01-30 NOTE — Progress Notes (Signed)
Case discussed with Dr. Hoffman at the time of the visit.  We reviewed the resident's history and exam and pertinent patient test results.  I agree with the assessment, diagnosis, and plan of care documented in the resident's note. 

## 2017-01-31 ENCOUNTER — Encounter: Payer: Self-pay | Admitting: Internal Medicine

## 2017-01-31 LAB — LIPID PANEL
CHOL/HDL RATIO: 3.6 ratio (ref 0.0–4.4)
CHOLESTEROL TOTAL: 182 mg/dL (ref 100–199)
HDL: 50 mg/dL (ref >39)
LDL Calculated: 107 mg/dL — ABNORMAL HIGH (ref 0–99)
TRIGLYCERIDES: 123 mg/dL (ref 0–149)
VLDL CHOLESTEROL CAL: 25 mg/dL (ref 5–40)

## 2017-02-03 LAB — CYTOLOGY - PAP
Diagnosis: NEGATIVE
HPV (WINDOPATH): NOT DETECTED

## 2017-03-05 ENCOUNTER — Ambulatory Visit: Payer: Self-pay

## 2017-03-06 ENCOUNTER — Ambulatory Visit: Payer: Self-pay

## 2017-03-13 ENCOUNTER — Ambulatory Visit: Payer: Self-pay

## 2017-03-21 ENCOUNTER — Telehealth: Payer: Self-pay | Admitting: *Deleted

## 2017-03-24 ENCOUNTER — Encounter: Payer: Self-pay | Admitting: Gastroenterology

## 2017-03-24 NOTE — Telephone Encounter (Signed)
No phone call made - encounter accidentally opened in error while checking referral. Colleen Williams, 03/24/17- 5:17P

## 2017-04-23 ENCOUNTER — Other Ambulatory Visit: Payer: Self-pay | Admitting: Obstetrics and Gynecology

## 2017-04-23 DIAGNOSIS — Z1231 Encounter for screening mammogram for malignant neoplasm of breast: Secondary | ICD-10-CM

## 2017-05-08 ENCOUNTER — Ambulatory Visit (AMBULATORY_SURGERY_CENTER): Payer: Self-pay | Admitting: *Deleted

## 2017-05-08 VITALS — Ht 66.0 in | Wt 237.2 lb

## 2017-05-08 DIAGNOSIS — Z1211 Encounter for screening for malignant neoplasm of colon: Secondary | ICD-10-CM

## 2017-05-08 MED ORDER — NA SULFATE-K SULFATE-MG SULF 17.5-3.13-1.6 GM/177ML PO SOLN: 1.0000 [IU] | Freq: Once | ORAL | 0 refills | Status: AC

## 2017-05-08 NOTE — Progress Notes (Signed)
No egg or soy allergy known to patient  No issues with past sedation with any surgeries  or procedures, no intubation problems  No diet pills per patient No home 02 use per patient  No blood thinners per patient  Pt denies issues with constipation  No A fib or A flutter  EMMI video sent to pt's e mail    Samples of this drug were given to the patient, quantity 1 Suprep, Lot Number [id number] Exp. 6/20

## 2017-05-22 ENCOUNTER — Encounter: Payer: Self-pay | Admitting: Gastroenterology

## 2017-05-22 ENCOUNTER — Ambulatory Visit (AMBULATORY_SURGERY_CENTER): Payer: Self-pay | Admitting: Gastroenterology

## 2017-05-22 VITALS — BP 118/84 | HR 77 | Temp 97.8°F | Resp 18 | Ht 66.0 in | Wt 237.0 lb

## 2017-05-22 DIAGNOSIS — D128 Benign neoplasm of rectum: Secondary | ICD-10-CM

## 2017-05-22 DIAGNOSIS — D123 Benign neoplasm of transverse colon: Secondary | ICD-10-CM

## 2017-05-22 DIAGNOSIS — Z1211 Encounter for screening for malignant neoplasm of colon: Secondary | ICD-10-CM

## 2017-05-22 DIAGNOSIS — K6389 Other specified diseases of intestine: Secondary | ICD-10-CM

## 2017-05-22 DIAGNOSIS — Z1212 Encounter for screening for malignant neoplasm of rectum: Secondary | ICD-10-CM

## 2017-05-22 MED ORDER — SODIUM CHLORIDE 0.9 % IV SOLN: 500.0000 mL | INTRAVENOUS | Status: DC

## 2017-05-22 NOTE — Patient Instructions (Signed)
YOU HAD AN ENDOSCOPIC PROCEDURE TODAY AT [facility]:   Refer to the procedure report that was given to you for any specific questions about what was found during the examination.  If the procedure report does not answer your questions, please call your gastroenterologist to clarify.  If you requested that your care partner not be given the details of your procedure findings, then the procedure report has been included in a sealed envelope for you to review at your convenience later.  YOU SHOULD EXPECT: Some feelings of bloating in the abdomen. Passage of more gas than usual.  Walking can help get rid of the air that was put into your GI tract during the procedure and reduce the bloating. If you had a lower endoscopy (such as a colonoscopy or flexible sigmoidoscopy) you may notice spotting of blood in your stool or on the toilet paper. If you underwent a bowel prep for your procedure, you may not have a normal bowel movement for a few days.  Please Note:  You might notice some irritation and congestion in your nose or some drainage.  This is from the oxygen used during your procedure.  There is no need for concern and it should clear up in a day or so.  SYMPTOMS TO REPORT IMMEDIATELY:   Following lower endoscopy (colonoscopy or flexible sigmoidoscopy):  Excessive amounts of blood in the stool  Significant tenderness or worsening of abdominal pains  Swelling of the abdomen that is new, acute  Fever of 100F or higher   For urgent or emergent issues, a gastroenterologist can be reached at any hour by calling [phone].   DIET:  We do recommend a small meal at first, but then you may proceed to your regular diet.  Drink plenty of fluids but you should avoid alcoholic beverages for 24 hours.  ACTIVITY:  You should plan to take it easy for the rest of today and you should NOT DRIVE or use heavy machinery until tomorrow (because of the sedation medicines used during the test).     FOLLOW UP: Our staff will call the number listed on your records the next business day following your procedure to check on you and address any questions or concerns that you may have regarding the information given to you following your procedure. If we do not reach you, we will leave a message.  However, if you are feeling well and you are not experiencing any problems, there is no need to return our call.  We will assume that you have returned to your regular daily activities without incident.  If any biopsies were taken you will be contacted by phone or by letter within the next 1-3 weeks.  Please call us at [phone] if you have not heard about the biopsies in 3 weeks.    SIGNATURES/CONFIDENTIALITY: You and/or your care partner have signed paperwork which will be entered into your electronic medical record.  These signatures attest to the fact that that the information above on your After Visit Summary has been reviewed and is understood.  Full responsibility of the confidentiality of this discharge information lies with you and/or your care-partner.  No aspirin, aleve or ibuprofen for 5 days after your procedure.  Call us if you need [place].

## 2017-05-22 NOTE — Progress Notes (Signed)
Pt's states no medical or surgical changes since previsit or office visit. 

## 2017-05-22 NOTE — Progress Notes (Signed)
Report given to PACU, vss 

## 2017-05-22 NOTE — Progress Notes (Signed)
Called to room to assist during endoscopic procedure.  Patient ID and intended procedure confirmed with present staff. Received instructions for my participation in the procedure from the performing physician.  

## 2017-05-22 NOTE — Op Note (Signed)
[facility] Patient Name: Colleen Williams Procedure Date: 05/22/2017 10:58 AM MRN: [id number] Endoscopist: Mallie Mussel L. Loletha Carrow , MD Age: 61 Referring MD:  Date of Birth: 24-Mar-1956 Gender: Female Account #: [id number] Procedure:                Colonoscopy Indications:              Screening for colorectal malignant neoplasm, This                            is the patient's first colonoscopy Medicines:                Monitored Anesthesia Care Procedure:                Pre-Anesthesia Assessment:                           - Prior to the procedure, a History and Physical                            was performed, and patient medications and                            allergies were reviewed. The patient's tolerance of                            previous anesthesia was also reviewed. The risks                            and benefits of the procedure and the sedation                            options and risks were discussed with the patient.                            All questions were answered, and informed consent                            was obtained. Prior Anticoagulants: The patient has                            taken no previous anticoagulant or antiplatelet                            agents. ASA Grade Assessment: I - A normal, healthy                            patient. After reviewing the risks and benefits,                            the patient was deemed in satisfactory condition to                            undergo the procedure.  After obtaining informed consent, the colonoscope                            was passed under direct vision. Throughout the                            procedure, the patient's blood pressure, pulse, and                            oxygen saturations were monitored continuously. The                            Colonoscope was introduced through the anus and                            advanced to the the terminal ileum. The  colonoscopy                            was performed without difficulty. The patient                            tolerated the procedure well. The quality of the                            bowel preparation was good. The terminal ileum,                            ileocecal valve, appendiceal orifice, and rectum                            were photographed. The bowel preparation used was                            SUPREP. The quality of the bowel preparation was                            evaluated using the BBPS [facility] Bowel Preparation                            Scale) with scores of: Right Colon = 2, Transverse                            Colon = 2 and Left Colon = 2. The total BBPS score                            equals 6, after lavage. The patient misunderstood                            prep instructions and consumed all of it the                            evening prior to the procedure rather than a split  prep. Scope In: 11:01:40 AM Scope Out: 11:17:29 AM Scope Withdrawal Time: 0 hours 14 minutes 15 seconds  Total Procedure Duration: 0 hours 15 minutes 49 seconds  Findings:                 The perianal and digital rectal examinations were                            normal.                           A 1 mm polyp was found in the hepatic flexure. The                            polyp was sessile. The polyp was removed with a                            cold biopsy forceps. Resection and retrieval were                            complete.                           A 10 mm polyp was found in the rectum. The polyp                            was sessile. The polyp was removed with a hot                            snare. Resection and retrieval were complete.                           A few large-mouthed diverticula were found in the                            right colon.                           The exam was otherwise without abnormality on                             direct and retroflexion views. Complications:            No immediate complications. Estimated Blood Loss:     Estimated blood loss: none. Impression:               - One 1 mm polyp at the hepatic flexure, removed                            with a cold biopsy forceps. Resected and retrieved.                           - One 10 mm polyp in the rectum, removed with a hot  snare. Resected and retrieved.                           - Diverticulosis in the right colon.                           - The examination was otherwise normal on direct                            and retroflexion views. Recommendation:           - Patient has a contact number available for                            emergencies. The signs and symptoms of potential                            delayed complications were discussed with the                            patient. Return to normal activities tomorrow.                            Written discharge instructions were provided to the                            patient.                           - Resume previous diet.                           - Continue present medications.                           - No aspirin, ibuprofen, naproxen, or other                            non-steroidal anti-inflammatory drugs for 5 days                            after polyp removal.                           - Await pathology results.                           - Repeat colonoscopy is recommended for                            surveillance. The colonoscopy date will be                            determined after pathology results from today's                            exam become available for review.  L. Danis,  MD 05/22/2017 11:22:40 AM This report has been signed electronically.

## 2017-05-23 ENCOUNTER — Telehealth: Payer: Self-pay | Admitting: *Deleted

## 2017-05-23 NOTE — Telephone Encounter (Signed)
  Follow up Call-  Call back number 05/22/2017  Post procedure Call Back phone  # [phone]  Permission to leave phone message Yes  Some recent data might be hidden     Patient questions:  Do you have a fever, pain , or abdominal swelling? No. Pain Score  0 *  Have you tolerated food without any problems? Yes.    Have you been able to return to your normal activities? Yes.    Do you have any questions about your discharge instructions: Diet   No. Medications  No. Follow up visit  No.  Do you have questions or concerns about your Care? No.  Actions: * If pain score is 4 or above: No action needed, pain <4.

## 2017-05-27 ENCOUNTER — Ambulatory Visit (HOSPITAL_COMMUNITY)
Admission: RE | Admit: 2017-05-27 | Discharge: 2017-05-27 | Disposition: A | Discharge status: Home / Self Care | Payer: Self-pay | Source: Ambulatory Visit | Attending: Obstetrics and Gynecology | Admitting: Obstetrics and Gynecology

## 2017-05-27 ENCOUNTER — Ambulatory Visit: Admission: RE | Admit: 2017-05-27 | Payer: Self-pay | Source: Ambulatory Visit

## 2017-05-27 NOTE — Progress Notes (Signed)
Chart opened in error

## 2017-06-01 ENCOUNTER — Encounter: Payer: Self-pay | Admitting: Gastroenterology

## 2017-06-10 ENCOUNTER — Ambulatory Visit (HOSPITAL_COMMUNITY)
Admission: RE | Admit: 2017-06-10 | Discharge: 2017-06-10 | Disposition: A | Discharge status: Home / Self Care | Payer: Self-pay | Source: Ambulatory Visit | Attending: Obstetrics and Gynecology | Admitting: Obstetrics and Gynecology

## 2017-06-10 ENCOUNTER — Ambulatory Visit
Admission: RE | Admit: 2017-06-10 | Discharge: 2017-06-10 | Disposition: A | Discharge status: Home / Self Care | Payer: No Typology Code available for payment source | Source: Ambulatory Visit | Attending: Obstetrics and Gynecology | Admitting: Obstetrics and Gynecology

## 2017-06-10 ENCOUNTER — Encounter (HOSPITAL_COMMUNITY): Payer: Self-pay | Admitting: *Deleted

## 2017-06-10 VITALS — BP 128/90 | Temp 97.4°F | Ht 66.0 in

## 2017-06-10 DIAGNOSIS — Z1231 Encounter for screening mammogram for malignant neoplasm of breast: Secondary | ICD-10-CM

## 2017-06-10 DIAGNOSIS — Z1239 Encounter for other screening for malignant neoplasm of breast: Secondary | ICD-10-CM

## 2017-06-10 NOTE — Progress Notes (Signed)
No complaints today.   Pap Smear: Pap smear not completed today. Last Pap smear was 01/30/2017 at [facility] and normal with negative HPV. Per patient has no history of an abnormal Pap smear. Last Pap smear result is in EPIC.  Physical exam: Breasts Breasts symmetrical. No skin abnormalities bilateral breasts. No nipple retraction bilateral breasts. No nipple discharge bilateral breasts. No lymphadenopathy. No lumps palpated bilateral breasts. No complaints of pain or tenderness on exam. Referred patient to the [facility] for a screening mammogram. Appointment scheduled for Tuesday, June 10, 2017 at 1110.        Pelvic/Bimanual No Pap smear completed today since last Pap smear and HPV typing was 01/30/2017. Pap smear not indicated per BCCCP guidelines.   Smoking History: Patient is a former smoker that quit 09/16/2008.  Patient Navigation: Patient education provided. Access to services provided for patient through [facility] program.   Colorectal Cancer Screening: Per patient had a colonoscopy completed 2 weeks ago. No complaints today.

## 2017-06-10 NOTE — Patient Instructions (Signed)
Explained breast self awareness with Cornelia Copa. Patient did not need a Pap smear today due to last Pap smear and HPV typing was 01/30/2017. Let her know BCCCP will cover Pap smears and HPV typing every 5 years unless has a history of abnormal Pap smears. Referred patient to the [facility] for a screening mammogram. Appointment scheduled for Tuesday, June 10, 2017 at 1110. Let [facility] know the [facility] will follow up with her within the next couple weeks with results of mammogram by letter or phone. Myleah Noseworthy verbalized understanding.  Jeyren Danowski, Arvil Chaco, RN 1:52 PM

## 2017-06-13 ENCOUNTER — Encounter (HOSPITAL_COMMUNITY): Payer: Self-pay | Admitting: *Deleted

## 2017-07-30 NOTE — Progress Notes (Signed)
   CC: Follow-up on prediabetes  HPI:  Colleen Williams is a 61 y.o. female with history noted below that presents to the internal medicine clinic for follow-up on prediabetes.  Please see problem based charting for the status of patient's chronic medical conditions.  Past Medical History:  Diagnosis Date  . Arthritis     Review of Systems:  Review of Systems  Eyes: Negative for blurred vision, double vision, photophobia, pain, discharge and redness.  Respiratory: Negative for shortness of breath.   Cardiovascular: Negative for chest pain.  Neurological: Negative for dizziness, focal weakness and headaches.     Physical Exam:  Vitals:   07/31/17 1317 07/31/17 1345  BP: (!) 161/97 (!) 152/87  Pulse: 77 72  Temp: 97.8 F (36.6 C)   TempSrc: Oral   SpO2: 96%   Weight: 243 lb 14.4 oz (110.6 kg)    Physical Exam  Constitutional:  Obese  HENT:  Back right teeth broken, with caries   Cardiovascular: Normal rate, regular rhythm and normal heart sounds.  Pulmonary/Chest: Effort normal and breath sounds normal. No respiratory distress. She has no wheezes. She has no rales.     Assessment & Plan:   See encounters tab for problem based medical decision making.    Patient discussed with Dr. Eppie Gibson

## 2017-07-31 ENCOUNTER — Encounter: Payer: Self-pay | Admitting: Internal Medicine

## 2017-07-31 ENCOUNTER — Ambulatory Visit (INDEPENDENT_AMBULATORY_CARE_PROVIDER_SITE_OTHER): Payer: Self-pay | Admitting: Internal Medicine

## 2017-07-31 VITALS — BP 152/87 | HR 72 | Temp 97.8°F | Wt 243.9 lb

## 2017-07-31 DIAGNOSIS — R030 Elevated blood-pressure reading, without diagnosis of hypertension: Secondary | ICD-10-CM

## 2017-07-31 DIAGNOSIS — Z0000 Encounter for general adult medical examination without abnormal findings: Secondary | ICD-10-CM

## 2017-07-31 DIAGNOSIS — H43393 Other vitreous opacities, bilateral: Secondary | ICD-10-CM

## 2017-07-31 DIAGNOSIS — K0889 Other specified disorders of teeth and supporting structures: Secondary | ICD-10-CM

## 2017-07-31 DIAGNOSIS — K029 Dental caries, unspecified: Secondary | ICD-10-CM

## 2017-07-31 DIAGNOSIS — R7303 Prediabetes: Secondary | ICD-10-CM

## 2017-07-31 DIAGNOSIS — Z87891 Personal history of nicotine dependence: Secondary | ICD-10-CM

## 2017-07-31 DIAGNOSIS — Z6839 Body mass index (BMI) 39.0-39.9, adult: Secondary | ICD-10-CM

## 2017-07-31 DIAGNOSIS — E669 Obesity, unspecified: Secondary | ICD-10-CM

## 2017-07-31 LAB — GLUCOSE, CAPILLARY: Glucose-Capillary: 101 mg/dL — ABNORMAL HIGH (ref 65–99)

## 2017-07-31 LAB — POCT GLYCOSYLATED HEMOGLOBIN (HGB A1C): Hemoglobin A1C: 6.3

## 2017-07-31 NOTE — Patient Instructions (Signed)
Colleen Williams,  It was a pleasure seeing you today. Please follow up in 6 months.

## 2017-08-03 DIAGNOSIS — H43393 Other vitreous opacities, bilateral: Secondary | ICD-10-CM

## 2017-08-03 DIAGNOSIS — I10 Essential (primary) hypertension: Secondary | ICD-10-CM | POA: Insufficient documentation

## 2017-08-03 DIAGNOSIS — K029 Dental caries, unspecified: Secondary | ICD-10-CM | POA: Insufficient documentation

## 2017-08-03 DIAGNOSIS — R030 Elevated blood-pressure reading, without diagnosis of hypertension: Secondary | ICD-10-CM

## 2017-08-03 HISTORY — DX: Other vitreous opacities, bilateral: H43.393

## 2017-08-03 NOTE — Assessment & Plan Note (Signed)
Assessment:  Health care maintenance  Influenza vaccine offered and patient declined

## 2017-08-03 NOTE — Assessment & Plan Note (Signed)
Assessment:  Prediabetes Patient has been followed for pre-diabetes.  Patient had a hemoglobin A1C of 6.5 in 01/2017 previously hemoglobin A1Cs have been less than 6.5.  Today's hemoglobin A1C was 6.3.  Discussed lifestyle modification changes including diet and exercise.  Plan -continue to monitor - check hemoglobin A1C on next visit

## 2017-08-03 NOTE — Assessment & Plan Note (Signed)
Assessment:  Dental caries Patient states that her right back teeth have been broken for a while. She has some minimal pain when eating. She denies any abscess, drainage or swelling.  On exam patient appears to have dental caries.  Told patient to call clinic if she develops these symptoms. For now will refer to dentistry.   Plan -referral to dentistry

## 2017-08-03 NOTE — Assessment & Plan Note (Signed)
Assessment: Eye floaters Patient reports having eye floaters recently while reading. She denies any vision loss or changes in vision, eye pain or feelings of a curtain going over her eye.  Offered to refer to ophthalmology.  Patient states that at this time she is working on obtaining her orange card for insurance.  She asked that I wait to place referral until she is approved for insurance.  Plan -referral to ophthalmology once patient reports she has insurance

## 2017-08-03 NOTE — Assessment & Plan Note (Signed)
Assessment:  Elevated blood pressure reading  Patient's blood pressure was elevated today at 161/97 and 152/87 on repeat. Her previous blood pressures have averaged to be in the 056P systolic and 79Y diastolic. Will continue to monitor and not start medications today. If blood pressures continue to be elevated in the future could consider a thiazide diuretic  Plan -Reassess blood pressure at next visit

## 2017-08-04 NOTE — Progress Notes (Signed)
Case discussed with Dr. Hoffman at the time of the visit.  We reviewed the resident's history and exam and pertinent patient test results.  I agree with the assessment, diagnosis, and plan of care documented in the resident's note. 

## 2017-09-04 ENCOUNTER — Ambulatory Visit: Payer: Self-pay

## 2018-01-28 NOTE — Progress Notes (Deleted)
   CC: ***  HPI:  Ms.Lashya Dewalt is a 62 y.o.   Past Medical History:  Diagnosis Date  . Arthritis     Review of Systems:  ***  Physical Exam:  There were no vitals filed for this visit. ***  Assessment & Plan:   See encounters tab for problem based medical decision making.  Assessment: Prediabetes On 01/2017 patient had a hemoglobin A1c of 6.3. Exercise and diet changes have been discussed at previous clinic visits. Patient's hemoglobin A1c today is ***.  Will start metformin  Plan -Metformin 500 mg once a day  Assessment: History of elevated blood pressure readings Patient's blood pressure today is ***.  Consider thiazide diuretic  Assessment: Eye floaters Patient complained of eye floaters at last clinic visit on 07/2017. At that time of referral to ophthalmology was recommended however patient declined due to no insurance. Reassess today if patient continues to have eye floaters and would like a referral to ophthalmology  Assessment: Healthcare maintenance Patient is due for Td AP next seen in this was offered an office. Patient ***  Plan      Patient {GC/GE:[id number]::"discussed with","seen with"} Dr. {NAMES:[id number]::"Butcher","Granfortuna","E. Chrishauna Mee","Klima","Mullen","Narendra","Vincent"}

## 2018-01-29 ENCOUNTER — Other Ambulatory Visit: Payer: Self-pay

## 2018-01-29 ENCOUNTER — Ambulatory Visit (INDEPENDENT_AMBULATORY_CARE_PROVIDER_SITE_OTHER): Payer: Self-pay | Admitting: Internal Medicine

## 2018-01-29 ENCOUNTER — Encounter: Payer: Self-pay | Admitting: Internal Medicine

## 2018-01-29 DIAGNOSIS — N939 Abnormal uterine and vaginal bleeding, unspecified: Secondary | ICD-10-CM

## 2018-01-29 DIAGNOSIS — R093 Abnormal sputum: Secondary | ICD-10-CM

## 2018-01-29 DIAGNOSIS — R0982 Postnasal drip: Secondary | ICD-10-CM

## 2018-01-29 DIAGNOSIS — H43393 Other vitreous opacities, bilateral: Secondary | ICD-10-CM

## 2018-01-29 DIAGNOSIS — K029 Dental caries, unspecified: Secondary | ICD-10-CM

## 2018-01-29 DIAGNOSIS — Z0000 Encounter for general adult medical examination without abnormal findings: Secondary | ICD-10-CM

## 2018-01-29 DIAGNOSIS — Z23 Encounter for immunization: Secondary | ICD-10-CM

## 2018-01-29 DIAGNOSIS — R059 Cough, unspecified: Secondary | ICD-10-CM

## 2018-01-29 DIAGNOSIS — R05 Cough: Secondary | ICD-10-CM

## 2018-01-29 DIAGNOSIS — R7303 Prediabetes: Secondary | ICD-10-CM

## 2018-01-29 LAB — POCT GLYCOSYLATED HEMOGLOBIN (HGB A1C): Hemoglobin A1C: 6.3

## 2018-01-29 LAB — GLUCOSE, CAPILLARY: Glucose-Capillary: 215 mg/dL — ABNORMAL HIGH (ref 65–99)

## 2018-01-29 MED ORDER — FLUTICASONE PROPIONATE 50 MCG/ACT NA SUSP: 1.0000 | Freq: Every day | NASAL | 0 refills | Status: DC

## 2018-01-29 MED ORDER — BENZONATATE 100 MG PO CAPS: 100.0000 mg | ORAL_CAPSULE | Freq: Three times a day (TID) | ORAL | 0 refills | Status: DC

## 2018-01-29 NOTE — Patient Instructions (Addendum)
Ms. Hoff,  It was a pleasure seeing you today. Medications for your cough have been sent to the pharmacy.  A referral has been made for a dentist, eye doctor and OB/GYN.  Please follow up in 6 months or earlier as needed. Please call the office if you have any issues.

## 2018-01-29 NOTE — Progress Notes (Signed)
   CC: Follow-up on prediabetes and healthcare maintenance  HPI:  Ms.Davey Klemmer is a 62 y.o. female with history noted below the presents to the internal medicine clinic for follow-up on prediabetes and healthcare maintenance.  Please see problem based charting for the status of patient's chronic medical conditions.  Past Medical History:  Diagnosis Date  . Arthritis     Review of Systems:  Review of Systems  Constitutional: Negative for chills and fever.  HENT: Negative for ear pain, sinus pain and sore throat.   Respiratory: Positive for cough and sputum production. Negative for shortness of breath.   Cardiovascular: Negative for chest pain.  Gastrointestinal: Negative for nausea and vomiting.     Physical Exam:  Vitals:   01/29/18 1518  BP: 129/88  Pulse: 85  Temp: 97.7 F (36.5 C)  TempSrc: Oral  SpO2: 98%  Weight: 243 lb (110.2 kg)  Height: 5\' 6"  (1.676 m)   Physical Exam  Constitutional: She is well-developed, well-nourished, and in no distress.  HENT:  Mouth/Throat: No oropharyngeal exudate.  Slight irritation to the back of the throat  Cardiovascular: Normal rate, regular rhythm and normal heart sounds. Exam reveals no gallop and no friction rub.  No murmur heard. Pulmonary/Chest: Effort normal and breath sounds normal. No respiratory distress. She has no wheezes. She has no rales.  Musculoskeletal: She exhibits no edema.  Skin: Skin is warm and dry.     Assessment & Plan:   See encounters tab for problem based medical decision making.   Patient discussed with Dr. Dareen Piano

## 2018-02-01 DIAGNOSIS — R059 Cough, unspecified: Secondary | ICD-10-CM | POA: Insufficient documentation

## 2018-02-01 DIAGNOSIS — N939 Abnormal uterine and vaginal bleeding, unspecified: Secondary | ICD-10-CM | POA: Insufficient documentation

## 2018-02-01 DIAGNOSIS — R05 Cough: Secondary | ICD-10-CM | POA: Insufficient documentation

## 2018-02-01 NOTE — Assessment & Plan Note (Signed)
Assessment:  Uterine spotting LMP 04/24/2017.  Has had spotting since then that happens spontaneously throughout the month.  Will refer to [facility] -obgyn referral

## 2018-02-01 NOTE — Assessment & Plan Note (Signed)
Assessment:  Dental caries Patient was evaluated for dental caries on 07/2017 and at that time it was noted that she had right back teeth that were broken. No signs of infection at that time or today. Caries continue to be noted.  A referral to dentistry was made 07/2017 however patient was unable to schedule a visit.  Will refer to dentistry again  Plan -dentistry referral

## 2018-02-01 NOTE — Assessment & Plan Note (Signed)
Assessment:  Prediabetes Patient has been followed for pre-diabetes.  Today's hemoglobin A1C is 6.3.  Last hemoglobin A1C was 6.3.   Discussed lifestyle modification changes including diet and exercise again.  Plan -continue to monitor

## 2018-02-01 NOTE — Assessment & Plan Note (Signed)
Assessment: Eye floaters Patient was evaluated on 07/2017 for eye floaters and declined referral to ophthalmology at that time due to lack of insurance. Today patient reports she has the orange card and would like a referral.   Plan -referral to ophtalmology

## 2018-02-01 NOTE — Assessment & Plan Note (Signed)
Assessment: Healthcare maintenance Patient is due for Tdap and this was offered in office.  Patient accepted  Plan -Tdap given in office

## 2018-02-01 NOTE — Assessment & Plan Note (Signed)
Assessment: acute Cough Patient has a one-week history of productive cough with clear sputum.  She denies fever/chills, shortness of breath, sore throat or sinus pain.  She does report minimal wheezing during the day.  On exam post nasal drip noted.  I suspect patient has a viral illness that has caused a postnasal drip resulting in her cough.  Advised the use of a nasal spray, albuterol inhaler and Tessalon Perles to help with symptoms.  Plan -tessalon pearls -albuterol inhaler -fluticasone spray

## 2018-02-02 NOTE — Progress Notes (Signed)
Internal Medicine Clinic Attending  Case discussed with Dr. Hoffman at the time of the visit.  We reviewed the resident's history and exam and pertinent patient test results.  I agree with the assessment, diagnosis, and plan of care documented in the resident's note.  

## 2018-02-17 ENCOUNTER — Ambulatory Visit: Payer: Self-pay

## 2018-03-17 ENCOUNTER — Ambulatory Visit: Payer: Self-pay

## 2018-03-23 ENCOUNTER — Encounter: Payer: Self-pay | Admitting: Obstetrics & Gynecology

## 2018-03-30 NOTE — Addendum Note (Signed)
Addended by: Hulan Fray on: 03/30/2018 02:27 PM   Modules accepted: Orders

## 2018-04-20 ENCOUNTER — Encounter (INDEPENDENT_AMBULATORY_CARE_PROVIDER_SITE_OTHER): Payer: Self-pay

## 2018-04-20 ENCOUNTER — Ambulatory Visit: Payer: Self-pay

## 2018-04-22 ENCOUNTER — Other Ambulatory Visit: Payer: Self-pay | Admitting: *Deleted

## 2018-04-22 DIAGNOSIS — N939 Abnormal uterine and vaginal bleeding, unspecified: Secondary | ICD-10-CM

## 2018-04-30 ENCOUNTER — Encounter: Payer: Self-pay | Admitting: Internal Medicine

## 2018-05-14 ENCOUNTER — Telehealth: Payer: Self-pay | Admitting: Internal Medicine

## 2018-05-23 ENCOUNTER — Other Ambulatory Visit: Payer: Self-pay

## 2018-05-23 ENCOUNTER — Emergency Department (HOSPITAL_COMMUNITY)
Admission: EM | Admit: 2018-05-23 | Discharge: 2018-05-23 | Disposition: A | Discharge status: Home / Self Care | Payer: BLUE CROSS/BLUE SHIELD | Attending: Emergency Medicine | Admitting: Emergency Medicine

## 2018-05-23 ENCOUNTER — Encounter (HOSPITAL_COMMUNITY): Payer: Self-pay

## 2018-05-23 DIAGNOSIS — R1011 Right upper quadrant pain: Secondary | ICD-10-CM | POA: Insufficient documentation

## 2018-05-23 DIAGNOSIS — Z87891 Personal history of nicotine dependence: Secondary | ICD-10-CM | POA: Diagnosis not present

## 2018-05-23 DIAGNOSIS — R109 Unspecified abdominal pain: Secondary | ICD-10-CM

## 2018-05-23 DIAGNOSIS — G8929 Other chronic pain: Secondary | ICD-10-CM | POA: Insufficient documentation

## 2018-05-23 DIAGNOSIS — Z79899 Other long term (current) drug therapy: Secondary | ICD-10-CM | POA: Insufficient documentation

## 2018-05-23 DIAGNOSIS — M545 Low back pain: Secondary | ICD-10-CM | POA: Insufficient documentation

## 2018-05-23 LAB — URINALYSIS, ROUTINE W REFLEX MICROSCOPIC
Bilirubin Urine: NEGATIVE
Glucose, UA: NEGATIVE mg/dL
KETONES UR: NEGATIVE mg/dL
Leukocytes, UA: NEGATIVE
NITRITE: NEGATIVE
PH: 6 (ref 5.0–8.0)
PROTEIN: NEGATIVE mg/dL
Specific Gravity, Urine: 1.012 (ref 1.005–1.030)

## 2018-05-23 LAB — CBG MONITORING, ED: Glucose-Capillary: 127 mg/dL — ABNORMAL HIGH (ref 70–99)

## 2018-05-23 MED ORDER — NAPROXEN 250 MG PO TABS
500.0000 mg | ORAL_TABLET | Freq: Once | ORAL | Status: AC
  Administered 2018-05-23: 500 mg via ORAL
  Filled 2018-05-23: qty 2

## 2018-05-23 MED ORDER — CYCLOBENZAPRINE HCL 10 MG PO TABS: 10.0000 mg | ORAL_TABLET | Freq: Two times a day (BID) | ORAL | 0 refills | Status: DC | PRN

## 2018-05-23 MED ORDER — NAPROXEN 500 MG PO TABS: 500.0000 mg | ORAL_TABLET | Freq: Two times a day (BID) | ORAL | 0 refills | Status: DC

## 2018-05-23 NOTE — ED Triage Notes (Signed)
Pt presents for evaluation of ongoing back and leg pain to R side. States has intermittently, told its arthritis but cant stand pain. No injury.

## 2018-05-23 NOTE — Discharge Instructions (Signed)
1. Medications: Take Naprosyn with food twice daily, can also take [phone] mg of Tylenol every 6 hours as needed for pain. Do not exceed 4000 mg of Tylenol daily. You can take Flexeril as needed for muscle spasm up to twice daily but do not drive, drink alcohol, or operate heavy machinery while taking this medicine because it may make you drowsy.  I typically recommend taking this medicine only at night when you are going to sleep.  He can also cut these tablets in half if they make you feel very drowsy. 2. Treatment: rest, drink plenty of fluids, gentle stretching as discussed (see attached), alternate ice and heat (or stick with whichever feels best) 20 minutes on 20 minutes off. 3. Follow Up: Please followup with your primary doctor in 3-7 days for discussion of your diagnoses and further evaluation after today's visit; if you do not have a primary care doctor use the resource guide provided to find one;  Return to the ER for worsening back pain, fevers, difficulty walking, rash to your side, loss of bowel or bladder control or other concerning symptoms

## 2018-05-23 NOTE — ED Notes (Signed)
Patient ambulatory to bathroom with steady gait at this time 

## 2018-05-23 NOTE — ED Provider Notes (Signed)
[facility] Provider Note   CSN: [id number] Arrival date & time: 05/23/18  1325     History   Chief Complaint Chief Complaint  Patient presents with  . Back Pain  . Leg Pain    HPI Colleen Williams is a 62 y.o. female with history of morbid obesity, osteoarthritis, and chronic lumbar radiculopathy who presents to the emergency department with a chief complaint of right flank pain.  Patient reports that she helped a friend move and was lifting boxes 5 days ago.  She reports that 4 days ago she began to have some intermittent right flank pain that has continued to worsen since onset and increase in intensity.  The pain is now constant. She characterizes the pain as a burning.  She states the pain is nonradiating, but seems to move around in her right flank.  The pain is not worse with positional changes, standing, walking, twisting.  She has been taking ibuprofen and Tylenol without improvement in the pain.  She reports she has been having some increased urinary frequency.  She is unsure if this is related to her trying to drink more water not.  She denies hematuria or urinary hesitancy.  No vaginal pain or discharge.  No abdominal pain, nausea, vomiting, diarrhea, constipation.  She reports she has a history of chronic low back pain.  She also reports that she has been having some right hip pain that radiates down the right leg for some time.  She reports that both of these areas have increased in pain since she helped a friend move.  She states that the pain that she has been having in her low back and right leg do not feel the same as in her right flank.  She reports that he pain in her right leg worsens when she walks.  She characterizes this pain as sharp.  She has been ambulatory without difficulty.  Denies numbness, weakness, knee, or ankle pain.  No fever or chills, urinary or fecal incontinence, or saddle paresthesias.  She did have chickenpox as a  kid commute but no history of shingles. No known sick contacts.   The history is provided by the patient. No language interpreter was used.    Past Medical History:  Diagnosis Date  . Arthritis     Patient Active Problem List   Diagnosis Date Noted  . Vaginal spotting 02/01/2018  . Cough 02/01/2018  . Dental caries 08/03/2017  . Elevated blood pressure reading 08/03/2017  . Vitreous floaters of both eyes 08/03/2017  . Prediabetes 01/30/2017  . Obesity, morbid, BMI 40.0-49.9 ([place]) 01/30/2017  . Osteoarthritis of ankle and foot 07/30/2016  . Bilateral lower extremity edema 03/13/2016  . Healthcare maintenance 06/12/2015  . Lumbar radiculopathy, chronic 06/12/2015    Past Surgical History:  Procedure Laterality Date  . TUBAL LIGATION       OB History    Gravida  2   Para      Term      Preterm      AB      Living  2     SAB      TAB      Ectopic      Multiple      Live Births  2            Home Medications    Prior to Admission medications   Medication Sig Start Date End Date Taking? Authorizing Provider  acetaminophen (TYLENOL) 500 MG  tablet Take 1,000 mg by mouth every 6 (six) hours as needed for mild pain or moderate pain.    [provider]  benzonatate (TESSALON) 100 MG capsule Take 1 capsule (100 mg total) by mouth 3 (three) times daily. 01/29/18   Kalman Shan Ratliff, DO  fluticasone (FLONASE) 50 MCG/ACT nasal spray Place 1 spray into both nostrils daily. 01/29/18   Valinda Party, DO    Family History Family History  Problem Relation Age of Onset  . Diabetes Mother   . Colon cancer Neg Hx   . Colon polyps Neg Hx   . Esophageal cancer Neg Hx   . Stomach cancer Neg Hx   . Rectal cancer Neg Hx     Social History Social History   Tobacco Use  . Smoking status: Former Smoker    Last attempt to quit: 09/16/2008    Years since quitting: 9.6  . Smokeless tobacco: Former Network engineer Use Topics  . Alcohol use:  No    Alcohol/week: 0.0 standard drinks  . Drug use: No     Allergies   Patient has no known allergies.   Review of Systems Review of Systems  Constitutional: Negative for activity change, chills and fever.  HENT: Negative for congestion.   Respiratory: Negative for shortness of breath.   Cardiovascular: Negative for chest pain.  Gastrointestinal: Negative for abdominal pain, diarrhea, nausea and vomiting.  Genitourinary: Positive for flank pain and frequency. Negative for dysuria.  Musculoskeletal: Positive for arthralgias, back pain and myalgias. Negative for gait problem, joint swelling, neck pain and neck stiffness.  Skin: Negative for rash.  Allergic/Immunologic: Negative for immunocompromised state.  Neurological: Negative for weakness, light-headedness, numbness and headaches.  Psychiatric/Behavioral: Negative for confusion.   Physical Exam Updated Vital Signs BP (!) 147/90 (BP Location: Right Arm)   Pulse 88   Temp 97.9 F (36.6 C) (Oral)   Resp 17   LMP 04/24/2017 Comment: spotting only  SpO2 95%   Physical Exam  Constitutional: No distress.  HENT:  Head: Normocephalic.  Eyes: Conjunctivae are normal.  Neck: Neck supple.  Cardiovascular: Normal rate and regular rhythm. Exam reveals no gallop and no friction rub.  No murmur heard. Pulmonary/Chest: Effort normal and breath sounds normal. No stridor. No respiratory distress. She has no wheezes. She has no rales. She exhibits no tenderness.  Abdominal: Soft. She exhibits no distension and no mass. There is no tenderness. There is no rebound and no guarding. No hernia.  Musculoskeletal: She exhibits tenderness. She exhibits no edema or deformity.  No tenderness to the cervical or thoracic spinous processes or bilateral paraspinal muscles.  She has midline tenderness to the lumbar spinous processes without bilateral paraspinal muscle tenderness.  She has a reproducible tenderness to palpation to the right flank,  inferior to the right ribs.  Tenderness wraps around the flank.  No overlying rash, erythema, edema, warmth.  Mild tenderness palpation to the right SI joint, but right hip, knee, and ankle nontender to palpation.  Out of 5 strength against resistance of the bilateral lower extremities with dorsiflexion plantarflexion.  Sensation is intact and equal throughout.  DP pulses are 2+ and symmetric.  Neurological: She is alert.  Skin: Skin is warm. No rash noted.  Psychiatric: Her behavior is normal.  Nursing note and vitals reviewed.    ED Treatments / Results  Labs (all labs ordered are listed, but only abnormal results are displayed) Labs Reviewed  URINALYSIS, ROUTINE W REFLEX MICROSCOPIC - Abnormal; Notable  for the following components:      Result Value   Hgb urine dipstick SMALL (*)    Bacteria, UA RARE (*)    All other components within normal limits  CBG MONITORING, ED - Abnormal; Notable for the following components:   Glucose-Capillary 127 (*)    All other components within normal limits    EKG None  Radiology No results found.  Procedures Procedures (including critical care time)  Medications Ordered in ED Medications - No data to display   Initial Impression / Assessment and Plan / ED Course  I have reviewed the triage vital signs and the nursing notes.  Pertinent labs & imaging results that were available during my care of the patient were reviewed by me and considered in my medical decision making (see chart for details).     62 year old female with a history of hypertension, osteoarthritis, and chronic lumbar radiculopathy presenting with right flank pain.  She also reports worse than baseline low back pain and right hip pain that radiates down the right leg.  She does report some increased urinary frequency, but denies hematuria, dysuria, or urinary hesitancy.  No abdominal pain, nausea, vomiting, diarrhea, fever, or chills.  On exam, the patient has  reproducible tenderness to the right flank that is somewhat dermatomal and wraps wraps around the flank.  Initially, she had no CVA tenderness, but when the right flank pain is tract posteriorly, she is tender over the right CVA.  Abdominal exam is unremarkable.  Exam is somewhat limited secondary to body habitus, but abdomen is soft and nondistended.  Medical chart reviewed.  She did have some hyperglycemia when she was previously evaluated.  CBG is 127 today.  UA sent.  Differential diagnosis includes musculoskeletal strain, nephrolithiasis, cystitis, early presentation varicella-zoster.   Patient care transferred to [place] at the end of my shift. Patient presentation, ED course, and plan of care discussed with review of all pertinent labs and imaging. Please see his/her note for further details regarding further ED course and disposition.  Final Clinical Impressions(s) / ED Diagnoses   Final diagnoses:  None    ED Discharge Orders    None       Joanne Gavel, PA-C 05/23/18 1733    Pattricia Boss, MD 05/25/18 2014

## 2018-05-23 NOTE — ED Provider Notes (Signed)
Received patient at signout from [facility].  Refer to provider note for full history and physical examination.  Briefly, patient is a 62 year old female with history of obesity, osteoarthritis, chronic lumbar radiculopathy presenting for evaluation of right-sided flank pain since Wednesday after helping a friend move on Tuesday.  She has chronic leg pain states this is unchanged.  Flank pain has been worsening.  No urinary symptoms.  Awaiting UA and CBG for further evaluation.  MDM  CBG is not markedly elevated.  UA is not suggestive of UTI or nephrolithiasis.  Did advise patient that her symptoms could be suggestive of early shingles and to watch out for the possibility of developing a rash.  Will discharge with Naprosyn and Flexeril.  Advised of appropriate use of these medications.  Recommend gentle stretching.  No red flag signs concerning for cauda equina.  Doubt acute surgical intra-abdominal pathology.  She is ambulatory without difficulty.  Recommend follow-up with PCP for reevaluation of symptoms.  Discussed strict ED return precautions. Pt verbalized understanding of and agreement with plan and is safe for discharge home at this time.        Renita Papa, PA-C 05/23/18 1837    Pattricia Boss, MD 05/23/18 2105

## 2018-05-23 NOTE — ED Notes (Signed)
Pt verbalizes understanding of d/c instructions. Prescriptions reviewed with patient. Pt ambulatory at d/c with all belongings.  

## 2018-06-02 NOTE — Addendum Note (Signed)
Addended by: Hulan Fray on: 06/02/2018 06:54 AM   Modules accepted: Orders

## 2018-07-15 NOTE — Progress Notes (Deleted)
   CC: ***  HPI:  [name] is a 62 y.o.   Past Medical History:  Diagnosis Date  . Arthritis     Review of Systems:  ***  Physical Exam:  There were no vitals filed for this visit. ***  Assessment & Plan:   See encounters tab for problem based medical decision making.   Patient {GC/GE:3044014::"discussed with","seen with"} Dr. {NAMES:[id number]::"Butcher","Granfortuna","Colleen Williams","Klima","Mullen","Narendra","Vincent"}

## 2018-07-16 ENCOUNTER — Encounter: Payer: BLUE CROSS/BLUE SHIELD | Admitting: Internal Medicine

## 2018-08-03 ENCOUNTER — Ambulatory Visit: Payer: BLUE CROSS/BLUE SHIELD

## 2018-09-03 ENCOUNTER — Encounter: Payer: BLUE CROSS/BLUE SHIELD | Admitting: Internal Medicine

## 2018-09-03 NOTE — Progress Notes (Deleted)
   CC: ***  HPI:  [name] is a 62 y.o.   Past Medical History:  Diagnosis Date  . Arthritis     Review of Systems:  ***  Physical Exam:  There were no vitals filed for this visit. ***  Assessment & Plan:   See encounters tab for problem based medical decision making.  Assessment: Prediabetes Hemoglobin A1c  Assessment: History of elevated pressure readings Today's blood pressure is.  Thiazide diuretic  Assessment: Healthcare maintenance Offered influenza vaccine  Patient {GC/GE:3044014::"discussed with","seen with"} Dr. {NAMES:[id number]::"Butcher","Granfortuna","E. Xiana Carns","Klima","Mullen","Narendra","Vincent"}

## 2018-12-03 ENCOUNTER — Encounter: Payer: BLUE CROSS/BLUE SHIELD | Admitting: Internal Medicine

## 2019-01-28 ENCOUNTER — Other Ambulatory Visit: Payer: Self-pay

## 2019-01-28 ENCOUNTER — Ambulatory Visit (INDEPENDENT_AMBULATORY_CARE_PROVIDER_SITE_OTHER): Payer: BLUE CROSS/BLUE SHIELD | Admitting: Internal Medicine

## 2019-01-28 DIAGNOSIS — R7303 Prediabetes: Secondary | ICD-10-CM | POA: Diagnosis not present

## 2019-01-28 DIAGNOSIS — H538 Other visual disturbances: Secondary | ICD-10-CM

## 2019-01-28 DIAGNOSIS — K029 Dental caries, unspecified: Secondary | ICD-10-CM

## 2019-01-28 DIAGNOSIS — R030 Elevated blood-pressure reading, without diagnosis of hypertension: Secondary | ICD-10-CM | POA: Diagnosis not present

## 2019-01-28 NOTE — Progress Notes (Signed)
  [facility] Health Internal Medicine Residency Telephone Encounter Continuity Care Appointment  HPI:   This telephone encounter was created for [name] on 01/28/2019 for the following purpose/cc referral for opthalmologist and dentist.   Past Medical History:  Past Medical History:  Diagnosis Date  . Arthritis       ROS:  Review of Systems  Constitutional: Negative for chills and fever.  HENT: Negative for sore throat.   Eyes: Positive for blurred vision. Negative for double vision, pain, discharge and redness.  Respiratory: Negative for shortness of breath.   Cardiovascular: Negative for chest pain and leg swelling.  Gastrointestinal: Negative for nausea and vomiting.     Assessment / Plan / Recommendations:   Please see A&P under problem oriented charting for assessment of the patient's acute and chronic medical conditions.   As always, pt is advised that if symptoms worsen or new symptoms arise, they should go to an urgent care facility or to to ER for further evaluation.   Consent and Medical Decision Making:   Patient discussed with Dr. Daryll Drown  This is a telephone encounter between Colleen Williams and Boyd Kerbs on 01/28/2019 for opthalmology and dentist referral. The visit was conducted with the patient located at home and Boyd Kerbs at [facility]. The patient's identity was confirmed using their DOB and current address. The patient has consented to being evaluated through a telephone encounter and understands the associated risks (an examination cannot be done and the patient may need to come in for an appointment) / benefits (allows the patient to remain at home, decreasing exposure to coronavirus). I personally spent 7 minutes on medical discussion.

## 2019-01-29 DIAGNOSIS — H538 Other visual disturbances: Secondary | ICD-10-CM

## 2019-01-29 HISTORY — DX: Other visual disturbances: H53.8

## 2019-01-29 NOTE — Assessment & Plan Note (Signed)
Assessment:  History of elevated blood pressure readings Due to tele-health visit unable to obtain vitals.  Asked patient to come back for an inpatient visit in the next 2-3 months to check blood pressure.  Discussed that if this continues to be elevated she would benefit from starting blood pressure medication.  Likely would start HCTZ or lisinopril  Plan Blood pressure recheck on next visit

## 2019-01-29 NOTE — Assessment & Plan Note (Signed)
HPI:  Patient states she is having trouble reading close up due to blurry vision.  She states she would like to have an eye exam by an opthalmologist.  She denies any acute issues.  Assessment:  Blurry vision  Plan opthalmology referral

## 2019-01-29 NOTE — Progress Notes (Signed)
Internal Medicine Clinic Attending  Case discussed with Dr. Heber Cesar Chavez soon after the resident saw the patient.  We reviewed the resident's history, telephone conversation and pertinent patient test results.  I agree with the assessment, diagnosis, and plan of care documented in the resident's note.

## 2019-01-29 NOTE — Assessment & Plan Note (Signed)
HPI:  Patient states she has several broken teeth that have occurred over several years and would like a dental referral.  She denies any acute issues.  Assessment:  Broken teeth Likely from caries.  She has had a referral placed to dentistry in the past but has not seen anyone.  Will again place another dentist referral.    Plan -Dentist referral

## 2019-01-29 NOTE — Assessment & Plan Note (Signed)
Assessment:  History of prediabetes HgbA1C 6.3 previously.  Will need re-check at next clinic visit.  Plan -hbgA1C at next visit

## 2019-02-09 ENCOUNTER — Ambulatory Visit: Payer: BLUE CROSS/BLUE SHIELD

## 2019-02-15 ENCOUNTER — Ambulatory Visit: Payer: BLUE CROSS/BLUE SHIELD

## 2019-02-15 ENCOUNTER — Other Ambulatory Visit: Payer: Self-pay

## 2019-03-04 ENCOUNTER — Encounter: Payer: BLUE CROSS/BLUE SHIELD | Admitting: Internal Medicine

## 2019-03-11 ENCOUNTER — Ambulatory Visit (INDEPENDENT_AMBULATORY_CARE_PROVIDER_SITE_OTHER): Payer: BLUE CROSS/BLUE SHIELD | Admitting: Internal Medicine

## 2019-03-11 ENCOUNTER — Other Ambulatory Visit: Payer: Self-pay

## 2019-03-11 DIAGNOSIS — M7989 Other specified soft tissue disorders: Secondary | ICD-10-CM

## 2019-03-11 DIAGNOSIS — R600 Localized edema: Secondary | ICD-10-CM

## 2019-03-11 NOTE — Progress Notes (Signed)
  [facility] Internal Medicine Residency Telephone Encounter Continuity Care Appointment  HPI:   This telephone encounter was created for [name] on 03/11/2019 for the following purpose/cc chronic bilateral leg swelling.   Past Medical History:  Past Medical History:  Diagnosis Date  . Arthritis       ROS:  Review of Systems  Respiratory: Negative for shortness of breath.   Cardiovascular: Positive for leg swelling. Negative for orthopnea and claudication.  Gastrointestinal: Negative for nausea and vomiting.  Skin: Negative for rash.     Assessment / Plan / Recommendations:   Please see A&P under problem oriented charting for assessment of the patient's acute and chronic medical conditions.   As always, pt is advised that if symptoms worsen or new symptoms arise, they should go to an urgent care facility or to to ER for further evaluation.   Consent and Medical Decision Making:   Patient discussed with Dr. Daryll Drown  This is a telephone encounter between Cornelia Copa and Boyd Kerbs on 03/11/2019 for chronic bilateral leg swelling. The visit was conducted with the patient located at home and Boyd Kerbs at [facility]. The patient's identity was confirmed using their DOB and current address. The patient has consented to being evaluated through a telephone encounter and understands the associated risks (an examination cannot be done and the patient may need to come in for an appointment) / benefits (allows the patient to remain at home, decreasing exposure to coronavirus). I personally spent 12 minutes on medical discussion.

## 2019-03-11 NOTE — Assessment & Plan Note (Signed)
HPI: Patient states she has been having intermittent leg swelling for the past 6 months.  She states that her swelling gets worse throughout the day.  She has not tried anything for her symptoms.  She denies shortness of breath, orthopnea or leg erythema or pain.  Assessment: Chronic bilateral leg swelling Likely venous insufficiency.  Recommended using compression stockings, low-salt intake and elevating legs.  Patient does have a history of hypertension not on medications per patient preference.  Recommended that at next visit in 1 to 2 months that she have her blood pressure rechecked and if continues to be elevated may benefit from hydrochlorothiazide as this may help her leg swelling as well.  Plan -Compression stockings, elevate legs, low-salt intake

## 2019-03-15 ENCOUNTER — Encounter: Payer: Self-pay | Admitting: *Deleted

## 2019-03-21 NOTE — Progress Notes (Signed)
Internal Medicine Clinic Attending  Case discussed with Dr. Heber Cooper at the time of the visit.  We reviewed the resident's history, telephone conversation and pertinent patient test results.  I agree with the assessment, diagnosis, and plan of care documented in the resident's note.

## 2019-04-08 ENCOUNTER — Encounter: Payer: Self-pay | Admitting: Internal Medicine

## 2019-04-08 ENCOUNTER — Other Ambulatory Visit: Payer: Self-pay

## 2019-04-08 ENCOUNTER — Ambulatory Visit (INDEPENDENT_AMBULATORY_CARE_PROVIDER_SITE_OTHER): Payer: BLUE CROSS/BLUE SHIELD | Admitting: Internal Medicine

## 2019-04-08 VITALS — BP 149/83 | HR 96 | Temp 98.1°F | Ht 66.0 in | Wt 256.7 lb

## 2019-04-08 DIAGNOSIS — R7303 Prediabetes: Secondary | ICD-10-CM

## 2019-04-08 DIAGNOSIS — M7989 Other specified soft tissue disorders: Secondary | ICD-10-CM | POA: Diagnosis not present

## 2019-04-08 DIAGNOSIS — R030 Elevated blood-pressure reading, without diagnosis of hypertension: Secondary | ICD-10-CM | POA: Diagnosis not present

## 2019-04-08 DIAGNOSIS — E010 Iodine-deficiency related diffuse (endemic) goiter: Secondary | ICD-10-CM

## 2019-04-08 DIAGNOSIS — Z6841 Body Mass Index (BMI) 40.0 and over, adult: Secondary | ICD-10-CM

## 2019-04-08 DIAGNOSIS — H538 Other visual disturbances: Secondary | ICD-10-CM | POA: Diagnosis not present

## 2019-04-08 DIAGNOSIS — E119 Type 2 diabetes mellitus without complications: Secondary | ICD-10-CM

## 2019-04-08 DIAGNOSIS — R600 Localized edema: Secondary | ICD-10-CM

## 2019-04-08 LAB — POCT GLYCOSYLATED HEMOGLOBIN (HGB A1C): Hemoglobin A1C: 6.9 % — AB (ref 4.0–5.6)

## 2019-04-08 LAB — GLUCOSE, CAPILLARY: Glucose-Capillary: 161 mg/dL — ABNORMAL HIGH (ref 70–99)

## 2019-04-08 MED ORDER — LISINOPRIL 10 MG PO TABS: 10.0000 mg | ORAL_TABLET | Freq: Every day | ORAL | 2 refills | Status: DC

## 2019-04-08 NOTE — Patient Instructions (Signed)
Today we talked about your leg swelling. Continue your walking and wear compression stockings. Please get an echocardiogram of your heart, and I will call you with the results.  We also talked about your high blood pressure. Start taking Lisinopril 10mg  once a day and follow-up with me in 1 month for a blood pressure check.

## 2019-04-08 NOTE — Progress Notes (Signed)
   CC: new pt visit; and leg swelling  HPI:  Colleen Williams is a 63 y.o. F who presents today for bilateral leg swelling and a new pt visit.   Past Medical History:  Diagnosis Date  . Arthritis    Review of Systems:  Review of Systems  Constitutional: Negative for chills, fever and weight loss.  HENT: Negative for congestion and sore throat.   Eyes: Positive for blurred vision.  Respiratory: Negative for cough and shortness of breath.   Cardiovascular: Positive for leg swelling. Negative for chest pain, palpitations and orthopnea.  Gastrointestinal: Negative for diarrhea, heartburn, nausea and vomiting.  Genitourinary: Negative for frequency and urgency.  Neurological: Negative for dizziness, weakness and headaches.  Endo/Heme/Allergies: Negative for polydipsia.   Physical Exam:  Vitals:   04/08/19 1428  BP: (!) 149/83  Pulse: 96  Temp: 98.1 F (36.7 C)  TempSrc: Oral  SpO2: 97%  Weight: 256 lb 11.2 oz (116.4 kg)  Height: 5\' 6"  (1.676 m)   Physical Exam Vitals signs reviewed.  Constitutional:      General: She is not in acute distress. HENT:     Head: Normocephalic and atraumatic.     Comments: Thyromegaly    Mouth/Throat:     Mouth: Mucous membranes are moist.  Cardiovascular:     Rate and Rhythm: Normal rate and regular rhythm.     Heart sounds: Normal heart sounds. No murmur. No friction rub. No gallop.      Comments: Normal JVD Pulmonary:     Effort: Pulmonary effort is normal.     Breath sounds: No wheezing, rhonchi or rales.  Abdominal:     General: Bowel sounds are normal. There is no distension.     Palpations: Abdomen is soft.     Tenderness: There is no abdominal tenderness.  Musculoskeletal:     Comments: Trace bilateral edema  Skin:    General: Skin is warm and dry.  Neurological:     Mental Status: She is alert.      Assessment & Plan:   See Encounters Tab for problem based charting.  Patient seen with Dr. Evette Doffing

## 2019-04-08 NOTE — Assessment & Plan Note (Addendum)
Pt reports walking every day for exercise, but endorses eating lots of cookies and sweets. Pt's BMI today was 41.  Thyromegaly noted on exam, so will check a TSH.

## 2019-04-08 NOTE — Assessment & Plan Note (Addendum)
Pt is prediabetic with last A1c was 6.3 in May of 2019.  Plan - annual A1c recheck today was 6.9, which is diagnostic for diabetes. Urine microalbumin still pending. Will call pt to report abnormal lab result and encourage lifestyle modifications. Diabetes well controlled at this time so plan to give full diabetes education at follow-up appointment next month.

## 2019-04-08 NOTE — Assessment & Plan Note (Signed)
Blood pressure today was 149/83. Manual recheck was 140/84.  Plan - will start pt on Lisinopril 10mg  daily and follow-up with BP check in 1 month. Will also get BMP today to check baseline potassium and K.

## 2019-04-08 NOTE — Assessment & Plan Note (Signed)
HPI: Pt endorses chronic bilateral leg swelling. She states this is associated with mild foot "tightness" when she wakes up in the morning, and this pain subsides throughout the day. She has not tried compression stockings at home. Pt walks a mile every day and has to halfway because she gets tired. Denies any worsening of the swelling, high salt intake, orthopnea, or any leg erythema.  Assessment and Plan Likely venous insufficiency, so counseled pt about wearing compression stockings and continuing to exercise. In light of pt's metabolic syndrome, will get a echo to rule out any cardiac causes for her edema.

## 2019-04-09 ENCOUNTER — Encounter: Payer: Self-pay | Admitting: Student in an Organized Health Care Education/Training Program

## 2019-04-09 LAB — BMP8+ANION GAP
Anion Gap: 15 mmol/L (ref 10.0–18.0)
BUN/Creatinine Ratio: 32 — ABNORMAL HIGH (ref 12–28)
BUN: 19 mg/dL (ref 8–27)
CO2: 21 mmol/L (ref 20–29)
Calcium: 9.7 mg/dL (ref 8.7–10.3)
Chloride: 102 mmol/L (ref 96–106)
Creatinine, Ser: 0.59 mg/dL (ref 0.57–1.00)
GFR calc Af Amer: 114 mL/min/{1.73_m2} (ref >59)
GFR calc non Af Amer: 99 mL/min/{1.73_m2} (ref >59)
Glucose: 160 mg/dL — ABNORMAL HIGH (ref 65–99)
Potassium: 4.2 mmol/L (ref 3.5–5.2)
Sodium: 138 mmol/L (ref 134–144)

## 2019-04-09 LAB — LIPID PANEL
Chol/HDL Ratio: 4 ratio (ref 0.0–4.4)
Cholesterol, Total: 218 mg/dL — ABNORMAL HIGH (ref 100–199)
HDL: 55 mg/dL (ref >39)
LDL Calculated: 122 mg/dL — ABNORMAL HIGH (ref 0–99)
Triglycerides: 203 mg/dL — ABNORMAL HIGH (ref 0–149)
VLDL Cholesterol Cal: 41 mg/dL — ABNORMAL HIGH (ref 5–40)

## 2019-04-09 LAB — MICROALBUMIN / CREATININE URINE RATIO
Creatinine, Urine: 102 mg/dL
Microalb/Creat Ratio: 5 mg/g creat (ref 0–29)
Microalbumin, Urine: 4.8 ug/mL

## 2019-04-09 LAB — TSH: TSH: 0.524 u[IU]/mL (ref 0.450–4.500)

## 2019-04-09 NOTE — Progress Notes (Signed)
Internal Medicine Clinic Attending  I saw and evaluated the patient.  I personally confirmed the key portions of the history and exam documented by Dr. Jones and I reviewed pertinent patient test results.  The assessment, diagnosis, and plan were formulated together and I agree with the documentation in the resident's note.     

## 2019-04-14 ENCOUNTER — Telehealth: Payer: Self-pay | Admitting: Internal Medicine

## 2019-04-14 NOTE — Telephone Encounter (Signed)
Please advise 

## 2019-04-14 NOTE — Telephone Encounter (Signed)
Pt reporting her lisinopril (ZESTRIL) 10 MG tablet medication is giving her Dry Mouth, Coughing, Urinating on her self. Pt's pharmacy instructed pt to call her PCP if it did not stop after a week of starting this medication.

## 2019-04-15 MED ORDER — LOSARTAN POTASSIUM 25 MG PO TABS: 25.0000 mg | ORAL_TABLET | Freq: Every day | ORAL | 11 refills | Status: DC

## 2019-04-15 NOTE — Telephone Encounter (Signed)
Spoke with Colleen Williams this evening about her lab results and new blood pressure medication. Instructed her to discontinue the lisinopril due to cough and start losartan 25mg  daily, which was ordered today.   Additionally, spoke with the pt about her elevated A1c and cholesterol on her recent blood work. Stated that there are no medications to be added at this time, but I will see her in 1 month to discuss diabetes management. Pt endorsed she has a follow-up scheduled already. Encouraged her to continue exercising and decrease sugar/carbohydrate intake in the meantime. Pt was understanding and did not have any further questions at this time.

## 2019-04-15 NOTE — Telephone Encounter (Signed)
Pt is calling is back; pls return pt call [phone]

## 2019-05-10 ENCOUNTER — Ambulatory Visit: Payer: BLUE CROSS/BLUE SHIELD | Admitting: Internal Medicine

## 2019-05-10 ENCOUNTER — Other Ambulatory Visit: Payer: Self-pay

## 2019-05-27 ENCOUNTER — Other Ambulatory Visit: Payer: Self-pay

## 2019-05-27 ENCOUNTER — Ambulatory Visit (INDEPENDENT_AMBULATORY_CARE_PROVIDER_SITE_OTHER): Payer: BLUE CROSS/BLUE SHIELD | Admitting: Internal Medicine

## 2019-05-27 ENCOUNTER — Encounter: Payer: Self-pay | Admitting: Internal Medicine

## 2019-05-27 VITALS — BP 125/81 | HR 79 | Temp 98.0°F | Ht 66.0 in | Wt 259.4 lb

## 2019-05-27 DIAGNOSIS — E119 Type 2 diabetes mellitus without complications: Secondary | ICD-10-CM

## 2019-05-27 DIAGNOSIS — Z2821 Immunization not carried out because of patient refusal: Secondary | ICD-10-CM | POA: Diagnosis not present

## 2019-05-27 DIAGNOSIS — Z7984 Long term (current) use of oral hypoglycemic drugs: Secondary | ICD-10-CM

## 2019-05-27 DIAGNOSIS — I10 Essential (primary) hypertension: Secondary | ICD-10-CM | POA: Diagnosis not present

## 2019-05-27 DIAGNOSIS — Z79899 Other long term (current) drug therapy: Secondary | ICD-10-CM

## 2019-05-27 DIAGNOSIS — Z0000 Encounter for general adult medical examination without abnormal findings: Secondary | ICD-10-CM

## 2019-05-27 LAB — GLUCOSE, CAPILLARY: Glucose-Capillary: 121 mg/dL — ABNORMAL HIGH (ref 70–99)

## 2019-05-27 MED ORDER — METFORMIN HCL 500 MG PO TABS: 500.0000 mg | ORAL_TABLET | Freq: Every day | ORAL | 1 refills | Status: DC

## 2019-05-27 MED ORDER — BENZONATATE 100 MG PO CAPS: 100.0000 mg | ORAL_CAPSULE | Freq: Four times a day (QID) | ORAL | 1 refills | Status: AC | PRN

## 2019-05-27 NOTE — Assessment & Plan Note (Signed)
Pt's A1c elevated to 6.9 in July 2020. Pt states she has been trying to eat healthier with chicken/fish and less carbs, but admits that she eats a lot of bread. She enjoys going fishing in order to be active. Pt agreeable to starting a medication at this time to take simultaneously while she continues to make lifestyle modifications. She is hopeful to have some weight loss that will also help her diabetes.  Assessment - Type II diabetes  - will start metformin 500mg  daily today - follow-up in 2 months for A1c - diabetic foot exam today - referral to diabetes coordinator placed

## 2019-05-27 NOTE — Patient Instructions (Addendum)
Colleen Williams,  It was great seeing you today! I am glad you are doing so well, and your BP looks great. Keep up the good work!  Please start metformin for your diabetes. I have also placed a referral to our diabetes coordinator here in the office who can speak with you about diet and lifestyle changes to help control your sugars.  Please follow-up with me in 2 months to recheck your A1c.    Diabetes Mellitus and Nutrition, Adult When you have diabetes (diabetes mellitus), it is very important to have healthy eating habits because your blood sugar (glucose) levels are greatly affected by what you eat and drink. Eating healthy foods in the appropriate amounts, at about the same times every day, can help you:  Control your blood glucose.  Lower your risk of heart disease.  Improve your blood pressure.  Reach or maintain a healthy weight. Every person with diabetes is different, and each person has different needs for a meal plan. Your health care provider may recommend that you work with a diet and nutrition specialist (dietitian) to make a meal plan that is best for you. Your meal plan may vary depending on factors such as:  The calories you need.  The medicines you take.  Your weight.  Your blood glucose, blood pressure, and cholesterol levels.  Your activity level.  Other health conditions you have, such as heart or kidney disease. How do carbohydrates affect me? Carbohydrates, also called carbs, affect your blood glucose level more than any other type of food. Eating carbs naturally raises the amount of glucose in your blood. Carb counting is a method for keeping track of how many carbs you eat. Counting carbs is important to keep your blood glucose at a healthy level, especially if you use insulin or take certain oral diabetes medicines. It is important to know how many carbs you can safely have in each meal. This is different for every person. Your dietitian can help you calculate  how many carbs you should have at each meal and for each snack. Foods that contain carbs include:  Bread, cereal, rice, pasta, and crackers.  Potatoes and corn.  Peas, beans, and lentils.  Milk and yogurt.  Fruit and juice.  Desserts, such as cakes, cookies, ice cream, and candy. How does alcohol affect me? Alcohol can cause a sudden decrease in blood glucose (hypoglycemia), especially if you use insulin or take certain oral diabetes medicines. Hypoglycemia can be a life-threatening condition. Symptoms of hypoglycemia (sleepiness, dizziness, and confusion) are similar to symptoms of having too much alcohol. If your health care provider says that alcohol is safe for you, follow these guidelines:  Limit alcohol intake to no more than 1 drink per day for nonpregnant women and 2 drinks per day for men. One drink equals 12 oz of beer, 5 oz of wine, or 1 oz of hard liquor.  Do not drink on an empty stomach.  Keep yourself hydrated with water, diet soda, or unsweetened iced tea.  Keep in mind that regular soda, juice, and other mixers may contain a lot of sugar and must be counted as carbs. What are tips for following this plan?  Reading food labels  Start by checking the serving size on the "Nutrition Facts" label of packaged foods and drinks. The amount of calories, carbs, fats, and other nutrients listed on the label is based on one serving of the item. Many items contain more than one serving per package.  Check the  total grams (g) of carbs in one serving. You can calculate the number of servings of carbs in one serving by dividing the total carbs by 15. For example, if a food has 30 g of total carbs, it would be equal to 2 servings of carbs.  Check the number of grams (g) of saturated and trans fats in one serving. Choose foods that have low or no amount of these fats.  Check the number of milligrams (mg) of salt (sodium) in one serving. Most people should limit total sodium intake  to less than 2,300 mg per day.  Always check the nutrition information of foods labeled as "low-fat" or "nonfat". These foods may be higher in added sugar or refined carbs and should be avoided.  Talk to your dietitian to identify your daily goals for nutrients listed on the label. Shopping  Avoid buying canned, premade, or processed foods. These foods tend to be high in fat, sodium, and added sugar.  Shop around the outside edge of the grocery store. This includes fresh fruits and vegetables, bulk grains, fresh meats, and fresh dairy. Cooking  Use low-heat cooking methods, such as baking, instead of high-heat cooking methods like deep frying.  Cook using healthy oils, such as olive, canola, or sunflower oil.  Avoid cooking with butter, cream, or high-fat meats. Meal planning  Eat meals and snacks regularly, preferably at the same times every day. Avoid going long periods of time without eating.  Eat foods high in fiber, such as fresh fruits, vegetables, beans, and whole grains. Talk to your dietitian about how many servings of carbs you can eat at each meal.  Eat 4-6 ounces (oz) of lean protein each day, such as lean meat, chicken, fish, eggs, or tofu. One oz of lean protein is equal to: ? 1 oz of meat, chicken, or fish. ? 1 egg. ?  cup of tofu.  Eat some foods each day that contain healthy fats, such as avocado, nuts, seeds, and fish. Lifestyle  Check your blood glucose regularly.  Exercise regularly as told by your health care provider. This may include: ? 150 minutes of moderate-intensity or vigorous-intensity exercise each week. This could be brisk walking, biking, or water aerobics. ? Stretching and doing strength exercises, such as yoga or weightlifting, at least 2 times a week.  Take medicines as told by your health care provider.  Do not use any products that contain nicotine or tobacco, such as cigarettes and e-cigarettes. If you need help quitting, ask your health  care provider.  Work with a Social worker or diabetes educator to identify strategies to manage stress and any emotional and social challenges. Questions to ask a health care provider  Do I need to meet with a diabetes educator?  Do I need to meet with a dietitian?  What number can I call if I have questions?  When are the best times to check my blood glucose? Where to find more information:  American Diabetes Association: diabetes.org  Academy of Nutrition and Dietetics: www.eatright.[employer] of Diabetes and Digestive and Kidney Diseases (NIH): [employer] Summary  A healthy meal plan will help you control your blood glucose and maintain a healthy lifestyle.  Working with a diet and nutrition specialist (dietitian) can help you make a meal plan that is best for you.  Keep in mind that carbohydrates (carbs) and alcohol have immediate effects on your blood glucose levels. It is important to count carbs and to use alcohol carefully. This information is  not intended to replace advice given to you by your health care provider. Make sure you discuss any questions you have with your health care provider. Document Released: 05/30/2005 Document Revised: 08/15/2017 Document Reviewed: 10/07/2016 Elsevier Patient Education  2020 [employer].

## 2019-05-27 NOTE — Assessment & Plan Note (Addendum)
Pt switched to losartan 25mg  due to cough attributed to lisinopril. Pt endorses taking the medication each day and denies side effects. BP today 125/81.   BP Readings from Last 3 Encounters:  05/27/19 125/81  04/08/19 (!) 149/83  05/23/18 (!) 145/88    Assessment - Essential Hypertension, well controlled  - will continue losartan 25mg  daily - recheck potassium on BMP today  Pt still endorsing continued cough, though states it is much improved after switching medications. States she has had minimal white sputum production. No associated fevers, recent illness, sore throat/runny nose, or seasonal allergies. Pt would like to continue on current BP medication regimen. - will try tessalon as cough suppressant.

## 2019-05-27 NOTE — Assessment & Plan Note (Signed)
Pt declined influenza and pneumococcal vaccines today.

## 2019-05-27 NOTE — Progress Notes (Signed)
   CC: diabetes and HTN follow-up  HPI:  [name] is a 63 y.o. F with significant PMH who presents today for follow-up of her diabetes and hypertension. Please see problem-based charting for management of chronic conditions.  Past Medical History:  Diagnosis Date  . Arthritis    Review of Systems:  Review of Systems  Constitutional: Negative for chills and fever.  HENT: Negative for congestion and sore throat.   Respiratory: Positive for cough. Negative for shortness of breath and wheezing.   Cardiovascular: Negative for chest pain, palpitations and leg swelling.  Gastrointestinal: Negative for nausea and vomiting.   Physical Exam:  Vitals:   05/27/19 0838  BP: 125/81  Pulse: 79  Temp: 98 F (36.7 C)  TempSrc: Oral  SpO2: 99%  Weight: 259 lb 6.4 oz (117.7 kg)  Height: 5\' 6"  (1.676 m)   Physical Exam Vitals signs and nursing note reviewed.  Constitutional:      General: She is not in acute distress. Cardiovascular:     Rate and Rhythm: Normal rate and regular rhythm.     Heart sounds: Normal heart sounds.  Pulmonary:     Effort: Pulmonary effort is normal.     Breath sounds: Normal breath sounds.  Abdominal:     General: Abdomen is flat. Bowel sounds are normal.  Musculoskeletal:     Right lower leg: No edema.     Left lower leg: No edema.  Skin:    General: Skin is warm and dry.  Neurological:     Mental Status: She is alert.    Assessment & Plan:   See Encounters Tab for problem based charting.  Patient seen with Dr. Dareen Piano

## 2019-05-28 LAB — BMP8+ANION GAP
Anion Gap: 12 mmol/L (ref 10.0–18.0)
BUN/Creatinine Ratio: 22 (ref 12–28)
BUN: 13 mg/dL (ref 8–27)
CO2: 24 mmol/L (ref 20–29)
Calcium: 9.7 mg/dL (ref 8.7–10.3)
Chloride: 103 mmol/L (ref 96–106)
Creatinine, Ser: 0.58 mg/dL (ref 0.57–1.00)
GFR calc Af Amer: 114 mL/min/{1.73_m2} (ref >59)
GFR calc non Af Amer: 99 mL/min/{1.73_m2} (ref >59)
Glucose: 122 mg/dL — ABNORMAL HIGH (ref 65–99)
Potassium: 5 mmol/L (ref 3.5–5.2)
Sodium: 139 mmol/L (ref 134–144)

## 2019-05-28 NOTE — Addendum Note (Signed)
Addended by: Aldine Contes on: 05/28/2019 11:17 AM   Modules accepted: Level of Service

## 2019-05-28 NOTE — Progress Notes (Signed)
Internal Medicine Clinic Attending  I saw and evaluated the patient.  I personally confirmed the key portions of the history and exam documented by Dr. Jones and I reviewed pertinent patient test results.  The assessment, diagnosis, and plan were formulated together and I agree with the documentation in the resident's note.     

## 2019-06-21 ENCOUNTER — Encounter: Payer: BLUE CROSS/BLUE SHIELD | Admitting: Dietician

## 2019-06-25 NOTE — Addendum Note (Signed)
Addended by: Hulan Fray on: 06/25/2019 03:07 PM   Modules accepted: Orders

## 2019-07-12 ENCOUNTER — Other Ambulatory Visit: Payer: Self-pay | Admitting: Registered"

## 2019-07-12 DIAGNOSIS — Z20822 Contact with and (suspected) exposure to covid-19: Secondary | ICD-10-CM

## 2019-07-14 LAB — NOVEL CORONAVIRUS, NAA: SARS-CoV-2, NAA: NOT DETECTED

## 2019-07-26 ENCOUNTER — Encounter: Payer: BLUE CROSS/BLUE SHIELD | Admitting: Internal Medicine

## 2019-08-02 ENCOUNTER — Other Ambulatory Visit: Payer: Self-pay

## 2019-08-02 ENCOUNTER — Ambulatory Visit (INDEPENDENT_AMBULATORY_CARE_PROVIDER_SITE_OTHER): Payer: BLUE CROSS/BLUE SHIELD | Admitting: Internal Medicine

## 2019-08-02 ENCOUNTER — Encounter: Payer: Self-pay | Admitting: Internal Medicine

## 2019-08-02 VITALS — BP 141/75 | HR 78 | Temp 97.8°F | Ht 66.0 in | Wt 254.8 lb

## 2019-08-02 DIAGNOSIS — E119 Type 2 diabetes mellitus without complications: Secondary | ICD-10-CM | POA: Diagnosis not present

## 2019-08-02 DIAGNOSIS — M549 Dorsalgia, unspecified: Secondary | ICD-10-CM

## 2019-08-02 DIAGNOSIS — M1990 Unspecified osteoarthritis, unspecified site: Secondary | ICD-10-CM | POA: Diagnosis not present

## 2019-08-02 DIAGNOSIS — I10 Essential (primary) hypertension: Secondary | ICD-10-CM | POA: Diagnosis not present

## 2019-08-02 DIAGNOSIS — Z79899 Other long term (current) drug therapy: Secondary | ICD-10-CM

## 2019-08-02 DIAGNOSIS — Z7984 Long term (current) use of oral hypoglycemic drugs: Secondary | ICD-10-CM

## 2019-08-02 LAB — POCT GLYCOSYLATED HEMOGLOBIN (HGB A1C): Hemoglobin A1C: 6.8 % — AB (ref 4.0–5.6)

## 2019-08-02 LAB — GLUCOSE, CAPILLARY: Glucose-Capillary: 92 mg/dL (ref 70–99)

## 2019-08-02 MED ORDER — SEMAGLUTIDE 3 MG PO TABS: 3.0000 mg | ORAL_TABLET | Freq: Every day | ORAL | 3 refills | Status: DC

## 2019-08-02 MED ORDER — LOSARTAN POTASSIUM 50 MG PO TABS: 50.0000 mg | ORAL_TABLET | Freq: Every day | ORAL | 10 refills | Status: DC

## 2019-08-02 NOTE — Progress Notes (Signed)
   CC: diabetes and HTN follow-up  HPI:  [name] is a 63 y.o. F with significant PMH of diabetes, HTN, and osteoarthritis who presents for follow-up of her chronic medical conditions. Please see problem-based charting for additional information.   Past Medical History:  Diagnosis Date  . Arthritis   . Blurry vision, bilateral 01/29/2019  . Vitreous floaters of both eyes 08/03/2017   Review of Systems:   Review of Systems  Constitutional: Negative for chills and fever.  Cardiovascular: Negative for leg swelling.  Gastrointestinal: Negative for abdominal pain, nausea and vomiting.  Musculoskeletal: Positive for back pain. Negative for joint pain.  Neurological: Negative for dizziness and headaches.   Physical Exam:  Vitals:   08/02/19 1323  BP: (!) 141/75  Pulse: 78  Temp: 97.8 F (36.6 C)  TempSrc: Oral  SpO2: 97%  Weight: 254 lb 12.8 oz (115.6 kg)  Height: 5\' 6"  (1.676 m)   Physical Exam Vitals signs and nursing note reviewed.  Constitutional:      General: She is not in acute distress.    Appearance: She is obese. She is not ill-appearing.  Cardiovascular:     Rate and Rhythm: Normal rate and regular rhythm.     Heart sounds: Normal heart sounds.  Pulmonary:     Effort: Pulmonary effort is normal.  Skin:    General: Skin is warm and dry.  Neurological:     Mental Status: She is alert.    Assessment & Plan:   See Encounters Tab for problem based charting.  Patient seen with Dr. Rebeca Alert

## 2019-08-02 NOTE — Assessment & Plan Note (Signed)
Pt stopped taking metformin approximately 1 month ago due to R back/flank pain that she began experiencing. This pain went away with cessation of metformin. She continues to try to walk at least 30 minutes a day and eat smaller portions with less carbohydrates/starches and more fruits/vegetables. She continues to express that she desires weight loss on her own to control her diabetes, and defers referral to diabetes coordinator at this time.  Her A1c today is 6.8, with Glc check of 92. Microalbumin/Cr ratio normal on 7/23. Cr 0.58 on 9/10.  Assessment - Type II diabetes  - start semaglutide 3mg  daily - will check-in in 1 month in the hope of up titrating the dose to 7mg  daily at that time - glucometer order with test strips and lancets verbally prescribed to [place] - referral for eye exam - repeat A1c in 3 months

## 2019-08-02 NOTE — Assessment & Plan Note (Signed)
Pt taking losartan 25mg  daily. Denies any medication side effects, cough, chest pain, shortness of breath, dizziness, or lightheadedness. BP today is elevated at 141/75. Pt endorses taking her medication this morning. Denies eating a lot of salty foods, canned goods, soups, or frozen foods. BMP last visit unremarkable with Cr 0.58.  Assessment - Essential hypertension, no longer well controlled.  - increase losartan to 50mg  daily - continue to monitor   BP Readings from Last 3 Encounters:  08/02/19 (!) 141/75  05/27/19 125/81  04/08/19 (!) 149/83

## 2019-08-02 NOTE — Patient Instructions (Signed)
Ms. Bini,  It was nice seeing you today! Continue to exercise with daily walking and working on your diet with less calories and decreased sugar intake. I have started a new medicine for you today for your diabetes, called semaglutide. Please take this once a day before breakfast.   I have also increased your blood pressure medicine to lostartan 50mg  daily. You can take two tablets (25mg  each) of what you have left.  Please follow-up with me in 3 months to repeat your A1c.

## 2019-08-03 NOTE — Progress Notes (Signed)
Internal Medicine Clinic Attending  I saw and evaluated the patient.  I personally confirmed the key portions of the history and exam documented by Dr. Ronnald Ramp and I reviewed pertinent patient test results.  The assessment, diagnosis, and plan were formulated together and I agree with the documentation in the resident's note.  Lenice Pressman, M.D., Ph.D.

## 2019-08-05 ENCOUNTER — Telehealth: Payer: Self-pay | Admitting: Internal Medicine

## 2019-08-05 DIAGNOSIS — I10 Essential (primary) hypertension: Secondary | ICD-10-CM

## 2019-08-05 MED ORDER — LOSARTAN POTASSIUM 50 MG PO TABS: 50.0000 mg | ORAL_TABLET | Freq: Every day | ORAL | 10 refills | Status: DC

## 2019-08-05 NOTE — Telephone Encounter (Signed)
Called pt to discuss prescription options. Will resend losartan prescription to [facility] on [address] as it's on the $4 list. Pt understanding and agreeable. Instructed her to reach out if there are any additional issues picking up this prescription.

## 2019-08-05 NOTE — Telephone Encounter (Signed)
Pt requesting a a different Medication than   losartan (COZAAR) 50 MG tablet. .  Pt states she can not pay $65.00 for her medication and needs something else.    [facility] [id number], [place] AT [place]

## 2019-08-05 NOTE — Telephone Encounter (Signed)
Dr Ronnald Ramp, pt has signed up for insurance thru the affordable care act and her copay for losartan is 65.00 can you change it to something else that is affordable?

## 2019-08-26 LAB — HM DIABETES EYE EXAM

## 2019-09-08 ENCOUNTER — Encounter: Payer: Self-pay | Admitting: *Deleted

## 2019-11-04 ENCOUNTER — Encounter: Payer: BLUE CROSS/BLUE SHIELD | Admitting: Internal Medicine

## 2019-11-11 ENCOUNTER — Ambulatory Visit (INDEPENDENT_AMBULATORY_CARE_PROVIDER_SITE_OTHER): Payer: Self-pay | Admitting: Internal Medicine

## 2019-11-11 ENCOUNTER — Other Ambulatory Visit: Payer: Self-pay

## 2019-11-11 ENCOUNTER — Encounter: Payer: Self-pay | Admitting: Internal Medicine

## 2019-11-11 VITALS — BP 136/85 | HR 84 | Temp 97.9°F | Ht 66.0 in | Wt 255.3 lb

## 2019-11-11 DIAGNOSIS — I10 Essential (primary) hypertension: Secondary | ICD-10-CM

## 2019-11-11 DIAGNOSIS — E785 Hyperlipidemia, unspecified: Secondary | ICD-10-CM

## 2019-11-11 DIAGNOSIS — E1169 Type 2 diabetes mellitus with other specified complication: Secondary | ICD-10-CM

## 2019-11-11 DIAGNOSIS — Z79899 Other long term (current) drug therapy: Secondary | ICD-10-CM

## 2019-11-11 DIAGNOSIS — E119 Type 2 diabetes mellitus without complications: Secondary | ICD-10-CM

## 2019-11-11 LAB — POCT GLYCOSYLATED HEMOGLOBIN (HGB A1C): Hemoglobin A1C: 6.8 % — AB (ref 4.0–5.6)

## 2019-11-11 LAB — GLUCOSE, CAPILLARY: Glucose-Capillary: 160 mg/dL — ABNORMAL HIGH (ref 70–99)

## 2019-11-11 MED ORDER — SIMVASTATIN 20 MG PO TABS: 20.0000 mg | ORAL_TABLET | Freq: Every evening | ORAL | 6 refills | Status: DC

## 2019-11-11 MED ORDER — SEMAGLUTIDE 3 MG PO TABS: 3.0000 mg | ORAL_TABLET | Freq: Every day | ORAL | 3 refills | Status: DC

## 2019-11-11 NOTE — Patient Instructions (Signed)
Ms. Boskovich,  It was nice seeing you today! Continue to work on walking and staying active in addition to eating smaller portions and limiting your sugar and carb intake.  Please start taking simvastatin 20mg  daily to lower your cholesterol and help your blood vessels.   Follow-up with me in 3 months for blood work and a blood pressure check.

## 2019-11-11 NOTE — Progress Notes (Signed)
   CC: diabetes follow-up  HPI:  [name] is a 64 y.o. F with significant PMH of type II diabetes, hyperlipidemia, and HTN, who presents for diabetes follow-up. Please see problem based charting for continued management of pt's ongoing medical conditions.  Past Medical History:  Diagnosis Date  . Arthritis   . Blurry vision, bilateral 01/29/2019  . Vitreous floaters of both eyes 08/03/2017   Review of Systems:   Review of Systems  Constitutional: Negative for chills and fever.  Eyes: Negative for blurred vision.  Respiratory: Negative for cough and shortness of breath.   Cardiovascular: Negative for chest pain and palpitations.  Genitourinary: Negative for dysuria and frequency.  Endo/Heme/Allergies: Negative for polydipsia.   Physical Exam:  Vitals:   11/11/19 1330  BP: 136/85  Pulse: 84  Temp: 97.9 F (36.6 C)  TempSrc: Oral  SpO2: 96%  Weight: 255 lb 4.8 oz (115.8 kg)  Height: 5\' 6"  (1.676 m)   Physical Exam Vitals and nursing note reviewed.  Constitutional:      General: She is not in acute distress.    Appearance: She is obese. She is not ill-appearing.  Cardiovascular:     Rate and Rhythm: Normal rate and regular rhythm.     Heart sounds: Normal heart sounds.  Pulmonary:     Effort: Pulmonary effort is normal. No respiratory distress.     Breath sounds: Normal breath sounds.  Abdominal:     General: Abdomen is flat. Bowel sounds are normal.     Palpations: Abdomen is soft.     Tenderness: There is no abdominal tenderness.  Neurological:     Mental Status: She is alert.    Assessment & Plan:   See Encounters Tab for problem based charting.  Patient discussed with Dr. Evette Doffing

## 2019-11-12 DIAGNOSIS — E1169 Type 2 diabetes mellitus with other specified complication: Secondary | ICD-10-CM | POA: Insufficient documentation

## 2019-11-12 DIAGNOSIS — E785 Hyperlipidemia, unspecified: Secondary | ICD-10-CM | POA: Insufficient documentation

## 2019-11-12 NOTE — Assessment & Plan Note (Signed)
Pt not taking any diabetes medications at this time. Did not pick up semaglutide prescription as pt did not want to be taking additional medication for fear of side effects. States she continues to work on decreasing calories (limiting her portions to saucer sized plate), limited carbohydrate intake, and increased vegetables in her diet. A1c today stable at 6.8 with CBG 160. She had an eye exam on 08/26/2019 without evidence of active diabetic retinopathy.  Assessment - diet controlled type II diabetes  - pt to continue to work on lifestyle modifications - recheck A1c in 3 months, if stable can spread out to follow-up every 6 months

## 2019-11-12 NOTE — Assessment & Plan Note (Signed)
Last lipid panel in 03/2019 with LDL elevated to 122. Pt's 10 year ASCVD risk is 22.7% with concurrent diabetes and HTN. Denies history of chest pain, palpitations, shortness of breath, or leg swelling.  Assessment - Hyperlipidemia  Plan - will start moderate intensity statin (simvastatin 20mg  daily) - repeat lipid panel at next visit with goal of 50% LDL reduction - could consider ASA therapy in the future to continue to lower pt's ASCVD risk  Lipid Panel     Component Value Date/Time   CHOL 218 (H) 04/08/2019 1538   TRIG 203 (H) 04/08/2019 1538   HDL 55 04/08/2019 1538   CHOLHDL 4.0 04/08/2019 1538   CHOLHDL 3.0 Ratio 11/19/2007 2015   VLDL 14 11/19/2007 2015   LDLCALC 122 (H) 04/08/2019 1538   LABVLDL 41 (H) 04/08/2019 1538

## 2019-11-12 NOTE — Assessment & Plan Note (Signed)
Pt taking 50mg  losartan daily. Denies chest pain, shortness of breath, lightheadedness/dizziness, or other medication side effects. BP today remains elevated at 136/85. She would like to defer starting an additional medication at this time. She will be starting a statin and is concerned if she has side effects after starting two medications then she will not know which one to attribute the side effect to.  Assessment - Essential hypertension  - continue losartan 50mg  daily  - given concurrent diabetes and hyperlipidemia, BP goal is < [id number] systolic and < 80 diastolic - if BP remain elevated at next visit, would add on amlodipine  BP Readings from Last 3 Encounters:  11/11/19 136/85  08/02/19 (!) 141/75  05/27/19 125/81

## 2019-11-23 NOTE — Progress Notes (Signed)
Internal Medicine Clinic Attending  Case discussed with Dr. Jones at the time of the visit.  We reviewed the resident's history and exam and pertinent patient test results.  I agree with the assessment, diagnosis, and plan of care documented in the resident's note.  

## 2019-11-29 ENCOUNTER — Ambulatory Visit: Payer: Self-pay

## 2020-01-05 ENCOUNTER — Encounter: Payer: Self-pay | Admitting: *Deleted

## 2020-02-10 ENCOUNTER — Ambulatory Visit (INDEPENDENT_AMBULATORY_CARE_PROVIDER_SITE_OTHER): Payer: Self-pay | Admitting: Internal Medicine

## 2020-02-10 ENCOUNTER — Other Ambulatory Visit: Payer: Self-pay

## 2020-02-10 ENCOUNTER — Encounter: Payer: Self-pay | Admitting: Internal Medicine

## 2020-02-10 VITALS — BP 142/83 | HR 78 | Temp 98.0°F | Ht 66.0 in | Wt 253.2 lb

## 2020-02-10 DIAGNOSIS — I10 Essential (primary) hypertension: Secondary | ICD-10-CM

## 2020-02-10 DIAGNOSIS — E785 Hyperlipidemia, unspecified: Secondary | ICD-10-CM

## 2020-02-10 DIAGNOSIS — E1169 Type 2 diabetes mellitus with other specified complication: Secondary | ICD-10-CM

## 2020-02-10 DIAGNOSIS — E119 Type 2 diabetes mellitus without complications: Secondary | ICD-10-CM

## 2020-02-10 DIAGNOSIS — Z0000 Encounter for general adult medical examination without abnormal findings: Secondary | ICD-10-CM

## 2020-02-10 LAB — POCT GLYCOSYLATED HEMOGLOBIN (HGB A1C): Hemoglobin A1C: 7.1 % — AB (ref 4.0–5.6)

## 2020-02-10 LAB — GLUCOSE, CAPILLARY: Glucose-Capillary: 92 mg/dL (ref 70–99)

## 2020-02-10 NOTE — Progress Notes (Signed)
   CC: diabetes follow-up  HPI:  [name] is a 64 y.o. F with significant PMH of diet-controlled type II diabetes, HTN, and hyperlipidemia, who presents for diabetes follow-up. Please see problem-based charting additional information regarding pt's chronic medical conditions.   Past Medical History:  Diagnosis Date  . Arthritis   . Blurry vision, bilateral 01/29/2019  . Vitreous floaters of both eyes 08/03/2017   Review of Systems:   Review of Systems  Constitutional: Negative for chills, fever and malaise/fatigue.  Eyes: Negative for blurred vision.  Respiratory: Negative for shortness of breath.   Cardiovascular: Negative for chest pain and palpitations.  Genitourinary: Negative for dysuria.  Neurological: Negative for headaches.   Physical Exam:  Vitals:   02/10/20 1310  BP: (!) 142/83  Pulse: 78  Temp: 98 F (36.7 C)  TempSrc: Oral  SpO2: 97%  Weight: 253 lb 3.2 oz (114.9 kg)  Height: 5\' 6"  (1.676 m)   Physical Exam Vitals and nursing note reviewed.  Constitutional:      General: She is not in acute distress.    Appearance: She is obese. She is not ill-appearing.  Cardiovascular:     Rate and Rhythm: Normal rate and regular rhythm.     Heart sounds: Normal heart sounds.  Pulmonary:     Effort: Pulmonary effort is normal.     Breath sounds: Normal breath sounds. No wheezing, rhonchi or rales.  Skin:    General: Skin is warm and dry.  Neurological:     Mental Status: She is alert.  Psychiatric:        Mood and Affect: Mood normal.    Assessment & Plan:   See Encounters Tab for problem based charting.  Patient discussed with Dr. Evette Doffing

## 2020-02-12 MED ORDER — LOSARTAN POTASSIUM 100 MG PO TABS: 100.0000 mg | ORAL_TABLET | Freq: Every day | ORAL | 0 refills | Status: DC

## 2020-02-12 NOTE — Assessment & Plan Note (Addendum)
Pt endorses taking 50mg  losartan daily. Denies medication side effects, headaches, vision changes, lightheadedness/dizziness, shortness of breath, or chest pain. BP today is elevated to 142/83. Given pt's hyperlipidemia and diabetes, her BP goal is <130/80.  Assessment - uncontrolled essential hypertension  - increase losartan to 100mg  daily - repeat BMP at next visit - could consider additional of amlodipine if BP remains high  BP Readings from Last 3 Encounters:  02/10/20 (!) 142/83  11/11/19 136/85  08/02/19 (!) 141/75

## 2020-02-12 NOTE — Assessment & Plan Note (Signed)
Pt due to annual mammography for breast cancer screening. Last mammo in 2018 was negative (BI-RADs 1).  - screening mammo ordered

## 2020-02-12 NOTE — Patient Instructions (Signed)
Ms. Negash,  It was nice seeing you today! I am glad you are feeling well and continuing to work on diet and increasing exercise. Keep up the great work!  Today we made the following medication changes - increase losartan to 100mg  daily (you can take 2 of your 50mg  tablets until you run out, a refill for 100mg  tablets has been sent into your [facility]) - begin taking the whole tablet of simvastatin (the dose should be 20mg  daily)  Follow up in the clinic in 3 months to check your A1c, cholesterol, and kidney function.  Thank you for letting me be a part of your care!

## 2020-02-12 NOTE — Assessment & Plan Note (Signed)
Pt presents for follow-up of her diet-controlled type II diabetes. She did not tolerate trial of metformin previously due to back/flank pain. Has recently increased exercise regimen with a stair-stepper at home. Is working to reduce carbs and salt in her diet and is frequently reading nutrition labels to help aid her diet decisions. Her weight is down 2lbs since her last appointment. CBG today 92, and A1c 7.1 (from 6.8 previously).  Assessment - type II diabetes  - continue to encourage weight loss and lifestyle modifications - A1c in 3 months, if continues to uptrend would address adding on an oral hypoglycemic medication  Lab Results  Component Value Date   HGBA1C 7.1 (A) 02/10/2020

## 2020-02-12 NOTE — Assessment & Plan Note (Signed)
Pt began statin therapy after her last appointment. States she has been taking half of her simvastatin 20mg  pill due to fear of side effects. Pt denies ever experiencing side effects and says she is feeling well today. Denies myalgias, fatigue, chest pain, palpitations, shortness of breath, or peripheral edema. Last ASCVD risk score 22.7%.  Assessment - hyperlipidemia  - discussed with pt to begin full dose of statin therapy and she is aggreeable - repeat lipid panel at next appointment with goal of 50% LDL reduction - consider ASA therapy in the future depending on ASCVD risk

## 2020-02-15 NOTE — Progress Notes (Signed)
AVS placed in the mail this am.

## 2020-02-15 NOTE — Progress Notes (Signed)
Internal Medicine Clinic Attending  Case discussed with Dr. Jones at the time of the visit.  We reviewed the resident's history and exam and pertinent patient test results.  I agree with the assessment, diagnosis, and plan of care documented in the resident's note.  

## 2020-03-14 ENCOUNTER — Encounter: Payer: Self-pay | Admitting: Internal Medicine

## 2020-03-14 ENCOUNTER — Ambulatory Visit (INDEPENDENT_AMBULATORY_CARE_PROVIDER_SITE_OTHER): Payer: Self-pay | Admitting: Internal Medicine

## 2020-03-14 ENCOUNTER — Other Ambulatory Visit: Payer: Self-pay

## 2020-03-14 VITALS — BP 137/89 | HR 86 | Temp 97.9°F | Ht 66.0 in | Wt 265.5 lb

## 2020-03-14 DIAGNOSIS — M545 Low back pain, unspecified: Secondary | ICD-10-CM

## 2020-03-14 DIAGNOSIS — M5416 Radiculopathy, lumbar region: Secondary | ICD-10-CM

## 2020-03-14 LAB — POCT URINALYSIS DIPSTICK
Bilirubin, UA: NEGATIVE
Glucose, UA: NEGATIVE
Ketones, UA: NEGATIVE
Leukocytes, UA: NEGATIVE
Nitrite, UA: NEGATIVE
Protein, UA: NEGATIVE
Spec Grav, UA: >=1.03 — AB
Urobilinogen, UA: 0.2 U/dL
pH, UA: 5.5

## 2020-03-14 MED ORDER — HYDROCHLOROTHIAZIDE 12.5 MG PO CAPS: 12.5000 mg | ORAL_CAPSULE | Freq: Every day | ORAL | 5 refills | Status: DC

## 2020-03-14 NOTE — Progress Notes (Signed)
° °  CC: Back pain  HPI:  [name] is a 64 y.o. with the history listed below including lumbar radiculopathy presenting for back pain.     Past Medical History:  Diagnosis Date   Arthritis    Blurry vision, bilateral 01/29/2019   Vitreous floaters of both eyes 08/03/2017   Review of Systems:   Constitutional: Negative for chills and fever.  Respiratory: Negative for shortness of breath.   Cardiovascular: Negative for chest pain and leg swelling.  Gastrointestinal: Negative for abdominal pain, nausea and vomiting.  MSK: Positive for back pain. Neurological: Negative for dizziness and headaches.   Physical Exam:  Vitals:   03/14/20 0845  BP: 137/89  Pulse: 86  Temp: 97.9 F (36.6 C)  TempSrc: Oral  SpO2: 97%  Weight: 265 lb 8 oz (120.4 kg)  Height: 5\' 6"  (1.676 m)   Physical Exam Constitutional:      Appearance: Normal appearance.  HENT:     Head: Normocephalic and atraumatic.     Mouth/Throat:     Mouth: Mucous membranes are moist.     Pharynx: Oropharynx is clear.  Eyes:     Extraocular Movements: Extraocular movements intact.     Conjunctiva/sclera: Conjunctivae normal.     Pupils: Pupils are equal, round, and reactive to light.  Cardiovascular:     Rate and Rhythm: Normal rate and regular rhythm.  Pulmonary:     Effort: Pulmonary effort is normal.     Breath sounds: Normal breath sounds.  Abdominal:     General: Abdomen is flat. Bowel sounds are normal.     Palpations: Abdomen is soft.  Musculoskeletal:        General: Tenderness (TTP over left lumbosacral muscles) present. No swelling. Normal range of motion.     Cervical back: Normal range of motion and neck supple.  Skin:    General: Skin is warm and dry.     Capillary Refill: Capillary refill takes less than 2 seconds.  Neurological:     General: No focal deficit present.     Mental Status: She is alert and oriented to person, place, and time.  Psychiatric:        Mood and Affect: Mood  normal.        Behavior: Behavior normal.      Assessment & Plan:   See Encounters Tab for problem based charting.  Patient discussed with Dr. Philipp Ovens

## 2020-03-14 NOTE — Patient Instructions (Addendum)
Colleen Williams,  It was a pleasure to see you today. Thank you for coming in.   Today we discussed your back pain. This may not be from the medications, it sounds more like a muscle strain. We will be checking some labs today to check your kidney function, kidney damage often does not cause any symptoms.   You are concerned about the losartan causing the back pain. You can stop taking the Losartan. You can start taking hydrochlorathiazide daily.  You can resume the Zocor medication 1 week later. This medication can cause muscle issues. If you have any issues with this medication please contact us and let us know, we can switch to a different medication if that happens.   Please return to clinic in 1 month or sooner if needed.   Thank you again for coming in.   [name]

## 2020-03-15 DIAGNOSIS — M549 Dorsalgia, unspecified: Secondary | ICD-10-CM | POA: Insufficient documentation

## 2020-03-15 DIAGNOSIS — G8929 Other chronic pain: Secondary | ICD-10-CM | POA: Insufficient documentation

## 2020-03-15 LAB — BMP8+ANION GAP
Anion Gap: 14 mmol/L (ref 10.0–18.0)
BUN/Creatinine Ratio: 26 (ref 12–28)
BUN: 15 mg/dL (ref 8–27)
CO2: 22 mmol/L (ref 20–29)
Calcium: 9.6 mg/dL (ref 8.7–10.3)
Chloride: 102 mmol/L (ref 96–106)
Creatinine, Ser: 0.57 mg/dL (ref 0.57–1.00)
GFR calc Af Amer: 114 mL/min/{1.73_m2} (ref >59)
GFR calc non Af Amer: 99 mL/min/{1.73_m2} (ref >59)
Glucose: 132 mg/dL — ABNORMAL HIGH (ref 65–99)
Potassium: 4.5 mmol/L (ref 3.5–5.2)
Sodium: 138 mmol/L (ref 134–144)

## 2020-03-15 NOTE — Assessment & Plan Note (Signed)
>>  ASSESSMENT AND PLAN FOR BACK PAIN WRITTEN ON 03/15/2020 10:28 AM BY ARMINDA DAVED HERO, MD  This is a 64 year old female with history of lumbar radiculopathy with x-rays in 2011 showing L4-L5 disc degeneration.  Patient presents with lower back pain that started 1 week ago, she had had her losartan  and the simvastatin  increased on her last visit and she reports that she was concerned that this was causing her pain. There is no radiation, worse with standing, is constant and nothing seemed to improve it.  She states that she stopped taking her medications and started drinking a lot because she was concerned about her kidneys.  She denies any dysuria, hematuria, abdominal, chills, nausea or vomiting.  On exam she does have some tenderness to palpation over lumbosacral area no weakness or numbness noted on exam. No abdominal pain or CVA tenderness.  Symptoms and findings seem more concerning for a muscle strain. We discussed that the losartan  likely did not cause her symptoms, if she did have muscle issues then the statin medication does have that risk (however she had been on this for awhile so this seems less likely). If she did have kidney injury she likely would not have any symptoms unless she had an infection. We also obtained a urinalysis that showed no nitrates, no leukocytes or protein. She still expressed concern that this was what caused her back pain. We discussed that I still do not think that it is caused by the losartan  however if she would feel more comfortable we can start her on a different antihypertensive. She reported that she would like to do that.  Discussed that she can retry the statin medication however advised to start the hydrochlorothiazide  for 1 week and then start the statin so that she can tell which one caused her symptoms. She is agreeable to this plan.   -Stop Losartan  100 mg daily -Start HCTZ 12.5 mg daily -Check BMP -Resume the simvastatin  after the HCTZ -RTC in 1 month  for BP recheck

## 2020-03-15 NOTE — Assessment & Plan Note (Signed)
This is a 64 year old female with history of lumbar radiculopathy with x-rays in 2011 showing L4-L5 disc degeneration.  Patient presents with lower back pain that started 1 week ago, she had had her losartan and the simvastatin increased on her last visit and she reports that she was concerned that this was causing her pain. There is no radiation, worse with standing, is constant and nothing seemed to improve it.  She states that she stopped taking her medications and started drinking a lot because she was concerned about her kidneys.  She denies any dysuria, hematuria, abdominal, chills, nausea or vomiting.  On exam she does have some tenderness to palpation over lumbosacral area no weakness or numbness noted on exam. No abdominal pain or CVA tenderness.  Symptoms and findings seem more concerning for a muscle strain. We discussed that the losartan likely did not cause her symptoms, if she did have muscle issues then the statin medication does have that risk (however she had been on this for awhile so this seems less likely). If she did have kidney injury she likely would not have any symptoms unless she had an infection. We also obtained a urinalysis that showed no nitrates, no leukocytes or protein. She still expressed concern that this was what caused her back pain. We discussed that I still do not think that it is caused by the losartan however if she would feel more comfortable we can start her on a different antihypertensive. She reported that she would like to do that.  Discussed that she can retry the statin medication however advised to start the hydrochlorothiazide for 1 week and then start the statin so that she can tell which one caused her symptoms. She is agreeable to this plan.   -Stop Losartan 100 mg daily -Start HCTZ 12.5 mg daily -Check BMP -Resume the simvastatin after the HCTZ -RTC in 1 month for BP recheck

## 2020-03-16 NOTE — Progress Notes (Signed)
Internal Medicine Clinic Attending  Case discussed with Dr. Krienke at the time of the visit.  We reviewed the resident's history and exam and pertinent patient test results.  I agree with the assessment, diagnosis, and plan of care documented in the resident's note.    

## 2020-05-01 ENCOUNTER — Other Ambulatory Visit: Payer: Self-pay | Admitting: Internal Medicine

## 2020-05-09 ENCOUNTER — Other Ambulatory Visit: Payer: Self-pay | Admitting: Student

## 2020-05-09 MED ORDER — HYDROCHLOROTHIAZIDE 12.5 MG PO CAPS: 12.5000 mg | ORAL_CAPSULE | Freq: Every day | ORAL | 5 refills | Status: DC

## 2020-05-09 NOTE — Telephone Encounter (Signed)
Need refill on hydrochlorothiazide (MICROZIDE) 12.5 MG capsule ;pt contact [phone]   [id number] - [place], [place] AT [address]

## 2020-09-08 ENCOUNTER — Other Ambulatory Visit: Payer: Self-pay | Admitting: Internal Medicine

## 2020-09-11 ENCOUNTER — Telehealth: Payer: Self-pay

## 2020-09-11 ENCOUNTER — Other Ambulatory Visit: Payer: Self-pay

## 2020-09-11 ENCOUNTER — Other Ambulatory Visit: Payer: Self-pay | Admitting: Internal Medicine

## 2020-09-11 MED ORDER — HYDROCHLOROTHIAZIDE 12.5 MG PO CAPS: 12.5000 mg | ORAL_CAPSULE | Freq: Every day | ORAL | 1 refills | Status: DC

## 2020-09-11 NOTE — Telephone Encounter (Signed)
Error

## 2020-09-11 NOTE — Telephone Encounter (Signed)
Pt is requesting her hydrochlorothiazide (MICROZIDE) 12.5 MG capsule to be sent  [facility] [address] [place]), [place] - [id number] Phone:  [phone]  Fax:  [phone]      pt stated she has been out for two days now

## 2020-09-11 NOTE — Telephone Encounter (Signed)
Pt would like the Rx to be transferred to [facility] on [address].

## 2020-09-11 NOTE — Telephone Encounter (Signed)
Pt calling about HCTZ refill - informed it was refilled today and sent to [facility] on [address]. Pt stated she goes to [facility] -  Informed she can have it transferred; stated she will go to [facility] to pick it up then have it transferred to [facility].

## 2020-10-12 ENCOUNTER — Encounter (HOSPITAL_COMMUNITY): Payer: Self-pay

## 2020-10-12 ENCOUNTER — Ambulatory Visit (HOSPITAL_COMMUNITY)
Admission: EM | Admit: 2020-10-12 | Discharge: 2020-10-12 | Disposition: A | Discharge status: Home / Self Care | Payer: HRSA Program | Attending: Family Medicine | Admitting: Family Medicine

## 2020-10-12 ENCOUNTER — Other Ambulatory Visit: Payer: Self-pay

## 2020-10-12 DIAGNOSIS — R079 Chest pain, unspecified: Secondary | ICD-10-CM | POA: Diagnosis present

## 2020-10-12 DIAGNOSIS — J40 Bronchitis, not specified as acute or chronic: Secondary | ICD-10-CM | POA: Diagnosis not present

## 2020-10-12 DIAGNOSIS — Z7952 Long term (current) use of systemic steroids: Secondary | ICD-10-CM | POA: Diagnosis not present

## 2020-10-12 DIAGNOSIS — Z87891 Personal history of nicotine dependence: Secondary | ICD-10-CM | POA: Diagnosis not present

## 2020-10-12 DIAGNOSIS — Z20822 Contact with and (suspected) exposure to covid-19: Secondary | ICD-10-CM | POA: Insufficient documentation

## 2020-10-12 LAB — SARS CORONAVIRUS 2 (TAT 6-24 HRS): SARS Coronavirus 2: NEGATIVE

## 2020-10-12 MED ORDER — PREDNISONE 20 MG PO TABS: 40.0000 mg | ORAL_TABLET | Freq: Every day | ORAL | 0 refills | Status: DC

## 2020-10-12 MED ORDER — FLUTICASONE PROPIONATE 50 MCG/ACT NA SUSP: 1.0000 | Freq: Two times a day (BID) | NASAL | 2 refills | Status: DC

## 2020-10-12 MED ORDER — CETIRIZINE HCL 10 MG PO TABS: 10.0000 mg | ORAL_TABLET | Freq: Every day | ORAL | 1 refills | Status: DC

## 2020-10-12 NOTE — ED Provider Notes (Signed)
[place]    CSN: [id number] Arrival date & time: 10/12/20  1234      History   Chief Complaint Chief Complaint  Patient presents with  . Chest Pain  . Cough    HPI Colleen Williams is a 65 y.o. female.   Here today with cough and drainage ongoing for several months. Worst at night and very productive in the mornings. Denies fever, chills, SOB, sore throat. Notes she's here today because in addition to this she started with a funny chest sensation/tightness last night that is improved today but still mildly there. Has not been trying anything OTC for sxs. Thinks she may have seasonal allergies, no known hx of pulmonary dz and no sick contacts.      Past Medical History:  Diagnosis Date  . Arthritis   . Blurry vision, bilateral 01/29/2019  . Vitreous floaters of both eyes 08/03/2017    Patient Active Problem List   Diagnosis Date Noted  . Back pain 03/15/2020  . Hyperlipidemia associated with type 2 diabetes mellitus ([place]) 11/12/2019  . Dental caries 08/03/2017  . Essential hypertension 08/03/2017  . Diabetes ([place]) 01/30/2017  . Obesity, morbid, BMI 40.0-49.9 ([place]) 01/30/2017  . Osteoarthritis of ankle and foot 07/30/2016  . Bilateral lower extremity edema 03/13/2016  . Healthcare maintenance 06/12/2015  . Lumbar radiculopathy, chronic 06/12/2015    Past Surgical History:  Procedure Laterality Date  . TUBAL LIGATION      OB History    Gravida  2   Para      Term      Preterm      AB      Living  2     SAB      IAB      Ectopic      Multiple      Live Births  2            Home Medications    Prior to Admission medications   Medication Sig Start Date End Date Taking? Authorizing Provider  cetirizine (ZYRTEC ALLERGY) 10 MG tablet Take 1 tablet (10 mg total) by mouth daily. 10/12/20  Yes Volney American, PA-C  fluticasone [facility]) 50 MCG/ACT nasal spray Place 1 spray into both nostrils in the morning and at bedtime.  10/12/20  Yes Volney American, PA-C  predniSONE (DELTASONE) 20 MG tablet Take 2 tablets (40 mg total) by mouth daily with breakfast. 10/12/20  Yes Volney American, PA-C  acetaminophen (TYLENOL) 500 MG tablet Take 1,000 mg by mouth every 6 (six) hours as needed for mild pain or moderate pain.    [provider]  fluticasone (FLONASE) 50 MCG/ACT nasal spray Place 1 spray into both nostrils daily. 01/29/18   Kalman Shan Ratliff, DO  hydrochlorothiazide (MICROZIDE) 12.5 MG capsule Take 1 capsule (12.5 mg total) by mouth daily. 09/11/20   Jean Rosenthal, MD  naproxen (NAPROSYN) 500 MG tablet Take 1 tablet (500 mg total) by mouth 2 (two) times daily with a meal. 05/23/18   Fawze, Mina A, PA-C  simvastatin (ZOCOR) 20 MG tablet Take 1 tablet (20 mg total) by mouth every evening. 11/11/19 06/08/20  Ladona Horns, MD    Family History Family History  Problem Relation Age of Onset  . Diabetes Mother   . Colon cancer Neg Hx   . Colon polyps Neg Hx   . Esophageal cancer Neg Hx   . Stomach cancer Neg Hx   . Rectal cancer Neg Hx  Social History Social History   Tobacco Use  . Smoking status: Former Smoker    Quit date: 09/16/2008    Years since quitting: 12.0  . Smokeless tobacco: Former Network engineer  . Vaping Use: Never used  Substance Use Topics  . Alcohol use: Yes    Alcohol/week: 0.0 standard drinks    Comment: Occasionally wine.  . Drug use: No     Allergies   Patient has no known allergies.   Review of Systems Review of Systems PER HPI   Physical Exam Triage Vital Signs ED Triage Vitals  Enc Vitals Group     BP 10/12/20 1329 (!) 162/74     Pulse --      Resp 10/12/20 1329 18     Temp 10/12/20 1329 97.8 F (36.6 C)     Temp Source 10/12/20 1329 Oral     SpO2 10/12/20 1329 100 %     Weight 10/12/20 1325 240 lb (108.9 kg)     Height 10/12/20 1325 5\' 6"  (1.676 m)     Head Circumference --      Peak Flow --      Pain Score 10/12/20 1325 3      Pain Loc --      Pain Edu? --      Excl. in [place]? --    No data found.  Updated Vital Signs BP (!) 162/74 (BP Location: Right Arm)   Temp 97.8 F (36.6 C) (Oral)   Resp 18   Ht 5\' 6"  (1.676 m)   Wt 240 lb (108.9 kg)   SpO2 100%   BMI 38.74 kg/m   Visual Acuity Right Eye Distance:   Left Eye Distance:   Bilateral Distance:    Right Eye Near:   Left Eye Near:    Bilateral Near:     Physical Exam Vitals and nursing note reviewed.  Constitutional:      Appearance: Normal appearance. She is not ill-appearing.  HENT:     Head: Atraumatic.     Right Ear: Tympanic membrane normal.     Left Ear: Tympanic membrane normal.     Nose: Rhinorrhea present.     Mouth/Throat:     Mouth: Mucous membranes are moist.     Pharynx: Oropharynx is clear. No oropharyngeal exudate.  Eyes:     Extraocular Movements: Extraocular movements intact.     Conjunctiva/sclera: Conjunctivae normal.  Cardiovascular:     Rate and Rhythm: Normal rate and regular rhythm.     Heart sounds: Normal heart sounds.  Pulmonary:     Effort: Pulmonary effort is normal. No respiratory distress.     Breath sounds: Normal breath sounds. No wheezing or rales.  Musculoskeletal:        General: Normal range of motion.     Cervical back: Normal range of motion and neck supple.  Skin:    General: Skin is warm and dry.  Neurological:     Mental Status: She is alert and oriented to person, place, and time.  Psychiatric:        Mood and Affect: Mood normal.        Thought Content: Thought content normal.        Judgment: Judgment normal.      UC Treatments / Results  Labs (all labs ordered are listed, but only abnormal results are displayed) Labs Reviewed  SARS CORONAVIRUS 2 (TAT 6-24 HRS)    EKG   Radiology No results found.  Procedures  Procedures (including critical care time)  Medications Ordered in UC Medications - No data to display  Initial Impression / Assessment and Plan / UC Course  I  have reviewed the triage vital signs and the nursing notes.  Pertinent labs & imaging results that were available during my care of the patient were reviewed by me and considered in my medical decision making (see chart for details).  Clinical Course as of 10/12/20 1812  Thu Oct 12, 2020  1437 ED EKG [AW]    Clinical Course User Index [AW] Hans Eden, NP    Mildly hypertensive but otherwise vital signs very reassuring, EKG today NSR with no acute ST or T wave changes, lungs CTAB with O2 saturation 100%. Discussed reassuring findings with patient but reiterated that we cannot r/o cardiac cause of CP in this setting. She is aware she would need to go to ED for this and wishes to try medications written here and go to ED if worsening. WIll treat with prednisone, allergy regimen and close PCP f/u.   Final Clinical Impressions(s) / UC Diagnoses   Final diagnoses:  Bronchitis   Discharge Instructions   None    ED Prescriptions    Medication Sig Dispense Auth. Provider   predniSONE (DELTASONE) 20 MG tablet Take 2 tablets (40 mg total) by mouth daily with breakfast. 10 tablet Volney American, PA-C   cetirizine (ZYRTEC ALLERGY) 10 MG tablet Take 1 tablet (10 mg total) by mouth daily. 30 tablet Volney American, PA-C   fluticasone [facility]) 50 MCG/ACT nasal spray Place 1 spray into both nostrils in the morning and at bedtime. 16 g Volney American, [place]     PDMP not reviewed this encounter.   Volney American, [place] 10/12/20 [id number]

## 2020-10-12 NOTE — ED Triage Notes (Addendum)
Pt presents with cough and chest tightness starting last night. Slight cough at night.

## 2021-03-20 ENCOUNTER — Encounter: Payer: Self-pay | Admitting: *Deleted

## 2021-07-18 ENCOUNTER — Ambulatory Visit (INDEPENDENT_AMBULATORY_CARE_PROVIDER_SITE_OTHER): Payer: Medicare Other | Admitting: Student

## 2021-07-18 ENCOUNTER — Other Ambulatory Visit: Payer: Self-pay

## 2021-07-18 ENCOUNTER — Other Ambulatory Visit: Payer: Self-pay | Admitting: Student

## 2021-07-18 VITALS — BP 161/95 | HR 81 | Temp 98.4°F | Ht 66.0 in | Wt 253.3 lb

## 2021-07-18 DIAGNOSIS — E119 Type 2 diabetes mellitus without complications: Secondary | ICD-10-CM

## 2021-07-18 DIAGNOSIS — Z13820 Encounter for screening for osteoporosis: Secondary | ICD-10-CM

## 2021-07-18 DIAGNOSIS — I10 Essential (primary) hypertension: Secondary | ICD-10-CM

## 2021-07-18 DIAGNOSIS — Z0000 Encounter for general adult medical examination without abnormal findings: Secondary | ICD-10-CM

## 2021-07-18 DIAGNOSIS — Z1231 Encounter for screening mammogram for malignant neoplasm of breast: Secondary | ICD-10-CM | POA: Diagnosis not present

## 2021-07-18 DIAGNOSIS — E1169 Type 2 diabetes mellitus with other specified complication: Secondary | ICD-10-CM

## 2021-07-18 DIAGNOSIS — E785 Hyperlipidemia, unspecified: Secondary | ICD-10-CM

## 2021-07-18 LAB — POCT GLYCOSYLATED HEMOGLOBIN (HGB A1C): Hemoglobin A1C: 7.7 % — AB (ref 4.0–5.6)

## 2021-07-18 LAB — GLUCOSE, CAPILLARY: Glucose-Capillary: 163 mg/dL — ABNORMAL HIGH (ref 70–99)

## 2021-07-18 NOTE — Progress Notes (Signed)
   CC: routine follow-up  HPI:  [name] is a 65 y.o.  with class III obesity, type 2 diabetes mellitus presenting to clinic for routine follow-up.  Please see problem-based list for further details.  Past Medical History:  Diagnosis Date   Arthritis    Blurry vision, bilateral 01/29/2019   Vitreous floaters of both eyes 08/03/2017   Review of Systems:  As per HPI  Physical Exam:  Vitals:   07/18/21 0918  BP: (!) 161/95  Pulse: 81  Temp: 98.4 F (36.9 C)  TempSrc: Oral  SpO2: 97%  Weight: 253 lb 4.8 oz (114.9 kg)  Height: 5\' 6"  (1.676 m)   General: Resting comfortably in chair, no acute distress CV: Regular rate, rhythm. No murmurs appreciated. Distal pulses 2+ bilaterally. Pulm: Normal work of breathing on room air. Clear to auscultation bilaterally. GI: Abdomen soft, non-tender, non-distended. Normoactive bowel sounds. MSK: Normal bulk, tone. No pitting edema bilateral lower extremities. Feet: Grossly in tact bilaterally, no open wounds appreciated. Toenails cut, trimmed, no signs of infection. Sensation and motor function normal bilaterally. Neuro: Awake, alert, conversing appropriately.  Psych: Normal mood, affect, speech.  Assessment & Plan:   See Encounters Tab for problem based charting.  Patient discussed with Dr. Heber [place]

## 2021-07-18 NOTE — Patient Instructions (Addendum)
Ms.Quantia Moger, it was a pleasure seeing you today!  Today we discussed: - Diabetes: Your A1c is 7.7%. This is a little higher than we would like it to be. We will get some lab work to see how your kidneys are doing. If that looks okay, I will call you to start metformin. If you have any trouble with this medication, please give [place] a call. At your next visit, we can discuss switching to another medication that can help with weight loss.   - I have ordered a DEXA scan (looks at how strong your bones are) and a mammogram screening. I have also put in a referral to the eye doctor (ophthalmologist).   - I will call you tomorrow with the results of your lab work. We will likely start medications for blood pressure, cholesterol, and diabetes. We will discuss these during our call.  I have ordered the following labs today:   Lab Orders         Microalbumin / Creatinine Urine Ratio         Glucose, capillary         CMP14 + Anion Gap         Lipid Profile         Anemia Profile B         POC Hbg A1C       Referrals ordered today:   Referral Orders         Ambulatory referral to Ophthalmology      Follow-up: 4 months   Please make sure to arrive 15 minutes prior to your next appointment. If you arrive late, you may be asked to reschedule.   We look forward to seeing you next time. Please call our clinic at [phone] if you have any questions or concerns. The best time to call is Monday-Friday from 9am-4pm, but there is someone available 24/7. If after hours or the weekend, call the main hospital number and ask for the Internal Medicine Resident On-Call. If you need medication refills, please notify your pharmacy one week in advance and they will send [place] a request.  Thank you for letting us take part in your care. Wishing you the best!  Thank you, Sanjuan Dame, MD

## 2021-07-19 ENCOUNTER — Telehealth (HOSPITAL_BASED_OUTPATIENT_CLINIC_OR_DEPARTMENT_OTHER): Payer: Self-pay

## 2021-07-19 LAB — CMP14 + ANION GAP
ALT: 26 IU/L (ref 0–32)
AST: 15 IU/L (ref 0–40)
Albumin/Globulin Ratio: 1.5 (ref 1.2–2.2)
Albumin: 3.9 g/dL (ref 3.8–4.8)
Alkaline Phosphatase: 122 IU/L — ABNORMAL HIGH (ref 44–121)
Anion Gap: 13 mmol/L (ref 10.0–18.0)
BUN/Creatinine Ratio: 27 (ref 12–28)
BUN: 15 mg/dL (ref 8–27)
Bilirubin Total: 0.2 mg/dL (ref 0.0–1.2)
CO2: 24 mmol/L (ref 20–29)
Calcium: 9 mg/dL (ref 8.7–10.3)
Chloride: 103 mmol/L (ref 96–106)
Creatinine, Ser: 0.56 mg/dL — ABNORMAL LOW (ref 0.57–1.00)
Globulin, Total: 2.6 g/dL (ref 1.5–4.5)
Glucose: 153 mg/dL — ABNORMAL HIGH (ref 70–99)
Potassium: 3.9 mmol/L (ref 3.5–5.2)
Sodium: 140 mmol/L (ref 134–144)
Total Protein: 6.5 g/dL (ref 6.0–8.5)
eGFR: 101 mL/min/{1.73_m2} (ref >59)

## 2021-07-19 LAB — MICROALBUMIN / CREATININE URINE RATIO
Creatinine, Urine: 104.9 mg/dL
Microalb/Creat Ratio: 5 mg/g creat (ref 0–29)
Microalbumin, Urine: 5 ug/mL

## 2021-07-19 LAB — ANEMIA PROFILE B
Basophils Absolute: 0.1 10*3/uL (ref 0.0–0.2)
Basos: 1 %
EOS (ABSOLUTE): 0.2 10*3/uL (ref 0.0–0.4)
Eos: 2 %
Ferritin: 236 ng/mL — ABNORMAL HIGH (ref 15–150)
Folate: 18.4 ng/mL (ref >3.0)
Hematocrit: 44.7 % (ref 34.0–46.6)
Hemoglobin: 14.9 g/dL (ref 11.1–15.9)
Immature Grans (Abs): 0 10*3/uL (ref 0.0–0.1)
Immature Granulocytes: 1 %
Iron Saturation: 27 % (ref 15–55)
Iron: 81 ug/dL (ref 27–139)
Lymphocytes Absolute: 2.8 10*3/uL (ref 0.7–3.1)
Lymphs: 43 %
MCH: 28.3 pg (ref 26.6–33.0)
MCHC: 33.3 g/dL (ref 31.5–35.7)
MCV: 85 fL (ref 79–97)
Monocytes Absolute: 0.4 10*3/uL (ref 0.1–0.9)
Monocytes: 6 %
Neutrophils Absolute: 3.1 10*3/uL (ref 1.4–7.0)
Neutrophils: 47 %
Platelets: 261 10*3/uL (ref 150–450)
RBC: 5.26 x10E6/uL (ref 3.77–5.28)
RDW: 13 % (ref 11.7–15.4)
Retic Ct Pct: 2.1 % (ref 0.6–2.6)
Total Iron Binding Capacity: 299 ug/dL (ref 250–450)
UIBC: 218 ug/dL (ref 118–369)
Vitamin B-12: 622 pg/mL (ref 232–1245)
WBC: 6.6 10*3/uL (ref 3.4–10.8)

## 2021-07-19 LAB — LIPID PANEL
Chol/HDL Ratio: 4 ratio (ref 0.0–4.4)
Cholesterol, Total: 189 mg/dL (ref 100–199)
HDL: 47 mg/dL (ref >39)
LDL Chol Calc (NIH): 117 mg/dL — ABNORMAL HIGH (ref 0–99)
Triglycerides: 139 mg/dL (ref 0–149)
VLDL Cholesterol Cal: 25 mg/dL (ref 5–40)

## 2021-07-19 MED ORDER — ATORVASTATIN CALCIUM 40 MG PO TABS: 40.0000 mg | ORAL_TABLET | Freq: Every day | ORAL | 0 refills | Status: DC

## 2021-07-19 MED ORDER — HYDROCHLOROTHIAZIDE 12.5 MG PO TABS: 12.5000 mg | ORAL_TABLET | Freq: Every day | ORAL | 0 refills | Status: DC

## 2021-07-19 MED ORDER — METFORMIN HCL 500 MG PO TABS
500.0000 mg | ORAL_TABLET | Freq: Every day | ORAL | 0 refills | Status: DC
Start: 2021-07-19 — End: 2021-10-23

## 2021-07-19 NOTE — Assessment & Plan Note (Signed)
Weight today 253lb, down from 265lb at last visit over one year ago. Patient likely would benefit from GLP-1 agonist. Would consider adding on oral semaglutide for weight loss benefit and further glycemic control. Can also discuss referral to diabetic educator/nutritionist at next visit.  - Consider oral semaglutide - Consider referral to [facility]

## 2021-07-19 NOTE — Assessment & Plan Note (Signed)
BP Readings from Last 3 Encounters:  07/18/21 (!) 161/95  10/12/20 (!) 162/74  03/14/20 137/89   Blood pressure elevated today to 161/95. Patient has not been on any anti-hypertensive medications recently. Last time in our clinic was June of 2021. She denies any recent dizziness, dyspnea, chest pain, pitting edema. We will check a CMET today, hopefully re-start previous antihypertensive pending renal function.  - CMET - Re-start BP medications pending renal function  ADDENDUM: Renal function normal. Will re-start hydrochlorothiazide 12.5mg  today. Will have patient return to clinic in one month for blood pressure re-check. - Start hydrochlorothiazide 12.5mg  daily - Return to clinic in one month

## 2021-07-19 NOTE — Assessment & Plan Note (Signed)
-   Patient declined influenza and pneumococcal vaccines today  - Reports having 2 COVID-19 [place] ordered today  - Due for eye exam, ophthalmology referral placed   - DEXA scan ordered today  - Offered PAP smear today, but patient declined

## 2021-07-19 NOTE — Assessment & Plan Note (Addendum)
Patient was previously on simvastatin per chart review. She is currently not taking any medications. Will obtain lipid panel today, likely would benefit from high-intensity statin with history of class III obesity and type 2 diabetes mellitus.  - Follow-up lipid panel  ADDENDUM: LDL 117, 10-year ASCVD risk 28.4%. Will initiate atorvastatin 40mg  today. If patient does not tolerate well, would suggest switching to rosuvastatin. - Start atorvastatin 40mg  daily

## 2021-07-19 NOTE — Assessment & Plan Note (Signed)
A1c today 7.7%, up from 7.1% one year ago. She reports she is currently asymptomatic, denying polyuria, polydipsia, dizziness, peripheral neuropathy. Weight today is 253lb, down from 265lb during her last visit in June of last year. Will check renal function today, plan on initiating metformin if normal.  - Check renal function - Start metformin pending labs  ADDENDUM: Renal function normal, will initiate metformin 500mg  daily. Given class III obesity, patient likely would benefit from GLP-1. Of note, patient is hesitant regarding injectable medications. If patient does not tolerate metformin, would initiate oral semaglutide for glycemic control and assistance with weight loss. - Start metformin 500mg  once daily - Consider switching to oral semaglutide if metformin unsuccessful

## 2021-07-25 NOTE — Progress Notes (Signed)
Internal Medicine Clinic Attending ? ?Case discussed with Dr. Braswell  At the time of the visit.  We reviewed the resident?s history and exam and pertinent patient test results.  I agree with the assessment, diagnosis, and plan of care documented in the resident?s note.  ?

## 2021-08-20 ENCOUNTER — Other Ambulatory Visit: Payer: Self-pay

## 2021-08-20 ENCOUNTER — Ambulatory Visit (INDEPENDENT_AMBULATORY_CARE_PROVIDER_SITE_OTHER): Payer: Medicare Other | Admitting: Internal Medicine

## 2021-08-20 VITALS — BP 131/82 | HR 85 | Temp 97.7°F | Ht 66.0 in | Wt 252.5 lb

## 2021-08-20 DIAGNOSIS — I10 Essential (primary) hypertension: Secondary | ICD-10-CM | POA: Diagnosis not present

## 2021-08-20 DIAGNOSIS — E785 Hyperlipidemia, unspecified: Secondary | ICD-10-CM | POA: Diagnosis not present

## 2021-08-20 DIAGNOSIS — E1169 Type 2 diabetes mellitus with other specified complication: Secondary | ICD-10-CM

## 2021-08-20 DIAGNOSIS — Z0000 Encounter for general adult medical examination without abnormal findings: Secondary | ICD-10-CM

## 2021-08-20 DIAGNOSIS — E119 Type 2 diabetes mellitus without complications: Secondary | ICD-10-CM

## 2021-08-20 MED ORDER — ROSUVASTATIN CALCIUM 20 MG PO TABS: 20.0000 mg | ORAL_TABLET | Freq: Every day | ORAL | 0 refills | Status: DC

## 2021-08-20 NOTE — Patient Instructions (Addendum)
[name], it was a pleasure seeing you today! You endorsed feeling well today. Below are some of the things we talked about this visit. We look forward to seeing you in the follow up appointment!  Today we discussed: High blood pressure: You have high blood pressure and you were started on HCTZ 12.5 mg. You are not having any symptoms of dizziness, chest pain, headaches. Your blood pressure 131/82 which is near the goal of 130/80. I would like for you to continue monitoring your blood pressure at home and keeping a log of it. We will check lab work to make sure your kidney function is doing okay.  Diabetes: You started taking metformin 500 mg daily. You stated you are tolerating it well. Please continue taking it. We will check your A1c next visit and adjust it if needed. High Cholesterol: Your lab work showed your cholesterol was elevated. We started you on lipitor 40 mg on your last visit. You stated you are having pain in your right lower leg. We will switch to a different medication and see if this resolves. If this continues to be an issue, please let us know.   I have ordered the following labs today:  Lab Orders         BMP8+Anion Gap        Referrals ordered today:   Referral Orders  No referral(s) requested today     I have ordered the following medication/changed the following medications:   Stop the following medications: Medications Discontinued During This Encounter  Medication Reason   atorvastatin (LIPITOR) 40 MG tablet Change in therapy     Start the following medications: Meds ordered this encounter  Medications   rosuvastatin (CRESTOR) 20 MG tablet    Sig: Take 1 tablet (20 mg total) by mouth daily.    Dispense:  90 tablet    Refill:  0     Follow-up: 2 months  Please make sure to arrive 15 minutes prior to your next appointment. If you arrive late, you may be asked to reschedule.   We look forward to seeing you next time. Please call our clinic at  [phone] if you have any questions or concerns. The best time to call is Monday-Friday from 9am-4pm, but there is someone available 24/7. If after hours or the weekend, call the main hospital number and ask for the Internal Medicine Resident On-Call. If you need medication refills, please notify your pharmacy one week in advance and they will send [place] a request.  Thank you for letting us take part in your care. Wishing you the best!  Thank you, Idamae Schuller, MD

## 2021-08-20 NOTE — Assessment & Plan Note (Addendum)
Assessement/Plan: Patient presented for blood pressure follow up. Her home med is HCTZ in 12.5 mg. Her blood pressure today was 131/82 which in near goal. She is denying any associated symptoms such as dizziness, chest pain, headaches or shortness of breath. She did not report checking her blood pressures at home but was willing to bring a log to the next visit. -Continue current regimen of HCTZ 12.5 mg -Repeat BMP this visit -Follow up in 2 months and make adjustments with BP log readings.  Addendum: BMP showed renal function wnl.

## 2021-08-20 NOTE — Progress Notes (Signed)
   CC: bp follow up  HPI:  Ms.Arti Talamante is a 65 y.o. with medical history as below presenting to [facility] for blood pressure follow up.   Please see problem-based list for further details, assessments, and plans.  Past Medical History:  Diagnosis Date   Arthritis    Blurry vision, bilateral 01/29/2019   Vitreous floaters of both eyes 08/03/2017   Review of Systems:  Review of system negative unless stated in the problem list or HPI.    Physical Exam General: Well-developed, obese female Head: Normocephalic without scalp lesions.  Eyes: Conjunctivae pink, sclerae white, without icterus.  Mouth and Throat: Lips normal color, without lesions. Moist mucus membrane. Neck: Neck supple with full range of motion (ROM).   Lungs: CTAB, no wheeze, rhonchi or rales.  Cardiovascular: Normal heart sounds, no r/m/g, 2+ radial pulses Abdomen: No TTP, normal bowel sounds MSK: No asymmetry or muscle atrophy. Full range of motion (ROM) of all joints. No TTP on right lower extremity. Skin: warm, dry good skin turgor, no lesions Neuro: Alert and oriented. CN grossly intact Psych: Normal mood and normal affect   Assessment & Plan:   See Encounters Tab for problem based charting.  Patient seen with Dr. Dollene Cleveland, MD

## 2021-08-21 LAB — BMP8+ANION GAP
Anion Gap: 13 mmol/L (ref 10.0–18.0)
BUN/Creatinine Ratio: 33 — ABNORMAL HIGH (ref 12–28)
BUN: 18 mg/dL (ref 8–27)
CO2: 24 mmol/L (ref 20–29)
Calcium: 9.4 mg/dL (ref 8.7–10.3)
Chloride: 101 mmol/L (ref 96–106)
Creatinine, Ser: 0.55 mg/dL — ABNORMAL LOW (ref 0.57–1.00)
Glucose: 182 mg/dL — ABNORMAL HIGH (ref 70–99)
Potassium: 4.6 mmol/L (ref 3.5–5.2)
Sodium: 138 mmol/L (ref 134–144)
eGFR: 102 mL/min/{1.73_m2} (ref >59)

## 2021-08-21 NOTE — Assessment & Plan Note (Addendum)
Assessment/Plan:  Patient recently started on metformin 500 mg. A1c checked on 07/2021 was 7.7. She is currently denying any associated symptoms. She does not keep a log of her glucose readings. -Plan to continue current regimen  -Follow up in 2 months with repeat A1c -Medication adjustment after repeat A1c

## 2021-08-21 NOTE — Assessment & Plan Note (Addendum)
Assessment/Plan: Patient has HLD and was started on Lipitor 40 mg. She states after taking a few doses her medications, she began having right lower extremity pain located in the anterior tibial area. Pain is described as dull and unilateral. Her pain is atypical for myopathy 2/2 to statin use. She is sure it is due to one of her medications and has hx of discontinuing her medications due to having unspecified side effects.  -Will switch Lipitor to Coreg 20 mg daily -Advised patient to call the clinic if pain persist so we can evaluate further as we would like her to continue her statin as her ASCVD risk is 28%.

## 2021-08-22 ENCOUNTER — Encounter: Payer: Self-pay | Admitting: Internal Medicine

## 2021-08-22 NOTE — Assessment & Plan Note (Signed)
Patient declined vaccines. Her last cervical cancer screening was a co-test in 2018 which was negative. She is not due for repeat testing until next year but she is 65 years and all of her previous cervical screenings have been negative. Would discuss with patient if she would like to pursue testing next year.

## 2021-08-24 ENCOUNTER — Other Ambulatory Visit: Payer: Self-pay

## 2021-08-24 ENCOUNTER — Ambulatory Visit
Admission: RE | Admit: 2021-08-24 | Discharge: 2021-08-24 | Disposition: A | Discharge status: Home / Self Care | Payer: Medicare Other | Source: Ambulatory Visit | Attending: Internal Medicine | Admitting: Internal Medicine

## 2021-08-24 DIAGNOSIS — Z1231 Encounter for screening mammogram for malignant neoplasm of breast: Secondary | ICD-10-CM

## 2021-08-24 DIAGNOSIS — Z0000 Encounter for general adult medical examination without abnormal findings: Secondary | ICD-10-CM

## 2021-08-24 NOTE — Progress Notes (Signed)
Internal Medicine Clinic Attending  I saw and evaluated the patient.  I personally confirmed the key portions of the history and exam documented by Dr. Khan and I reviewed pertinent patient test results.  The assessment, diagnosis, and plan were formulated together and I agree with the documentation in the resident's note.  

## 2021-08-28 ENCOUNTER — Ambulatory Visit (HOSPITAL_BASED_OUTPATIENT_CLINIC_OR_DEPARTMENT_OTHER): Payer: Medicare Other

## 2021-09-27 ENCOUNTER — Other Ambulatory Visit: Payer: Self-pay

## 2021-09-27 ENCOUNTER — Encounter (HOSPITAL_COMMUNITY): Payer: Self-pay | Admitting: Emergency Medicine

## 2021-09-27 ENCOUNTER — Emergency Department (HOSPITAL_COMMUNITY)
Admission: EM | Admit: 2021-09-27 | Discharge: 2021-09-27 | Disposition: A | Discharge status: Home / Self Care | Payer: Medicare Other | Attending: Emergency Medicine | Admitting: Emergency Medicine

## 2021-09-27 ENCOUNTER — Emergency Department (HOSPITAL_COMMUNITY): Payer: Medicare Other

## 2021-09-27 DIAGNOSIS — R079 Chest pain, unspecified: Secondary | ICD-10-CM | POA: Insufficient documentation

## 2021-09-27 DIAGNOSIS — R059 Cough, unspecified: Secondary | ICD-10-CM | POA: Diagnosis not present

## 2021-09-27 DIAGNOSIS — R0789 Other chest pain: Secondary | ICD-10-CM | POA: Diagnosis not present

## 2021-09-27 DIAGNOSIS — E119 Type 2 diabetes mellitus without complications: Secondary | ICD-10-CM | POA: Insufficient documentation

## 2021-09-27 DIAGNOSIS — Z79899 Other long term (current) drug therapy: Secondary | ICD-10-CM | POA: Diagnosis not present

## 2021-09-27 DIAGNOSIS — I10 Essential (primary) hypertension: Secondary | ICD-10-CM | POA: Diagnosis not present

## 2021-09-27 DIAGNOSIS — R9431 Abnormal electrocardiogram [ECG] [EKG]: Secondary | ICD-10-CM | POA: Diagnosis not present

## 2021-09-27 LAB — CBC WITH DIFFERENTIAL/PLATELET
Abs Immature Granulocytes: 0.02 10*3/uL (ref 0.00–0.07)
Basophils Absolute: 0.1 10*3/uL (ref 0.0–0.1)
Basophils Relative: 1 %
Eosinophils Absolute: 0.1 10*3/uL (ref 0.0–0.5)
Eosinophils Relative: 1 %
HCT: 45.6 % (ref 36.0–46.0)
Hemoglobin: 15.3 g/dL — ABNORMAL HIGH (ref 12.0–15.0)
Immature Granulocytes: 0 %
Lymphocytes Relative: 35 %
Lymphs Abs: 2.6 10*3/uL (ref 0.7–4.0)
MCH: 28.7 pg (ref 26.0–34.0)
MCHC: 33.6 g/dL (ref 30.0–36.0)
MCV: 85.6 fL (ref 80.0–100.0)
Monocytes Absolute: 0.5 10*3/uL (ref 0.1–1.0)
Monocytes Relative: 6 %
Neutro Abs: 4.3 10*3/uL (ref 1.7–7.7)
Neutrophils Relative %: 57 %
Platelets: 256 10*3/uL (ref 150–400)
RBC: 5.33 MIL/uL — ABNORMAL HIGH (ref 3.87–5.11)
RDW: 13.1 % (ref 11.5–15.5)
WBC: 7.5 10*3/uL (ref 4.0–10.5)
nRBC: 0 % (ref 0.0–0.2)

## 2021-09-27 LAB — TROPONIN I (HIGH SENSITIVITY)
Troponin I (High Sensitivity): 4 ng/L (ref <18)
Troponin I (High Sensitivity): 4 ng/L (ref <18)

## 2021-09-27 LAB — BASIC METABOLIC PANEL
Anion gap: 12 (ref 5–15)
BUN: 7 mg/dL — ABNORMAL LOW (ref 8–23)
CO2: 25 mmol/L (ref 22–32)
Calcium: 9.7 mg/dL (ref 8.9–10.3)
Chloride: 102 mmol/L (ref 98–111)
Creatinine, Ser: 0.71 mg/dL (ref 0.44–1.00)
GFR, Estimated: >60 mL/min (ref >60)
Glucose, Bld: 180 mg/dL — ABNORMAL HIGH (ref 70–99)
Potassium: 3.6 mmol/L (ref 3.5–5.1)
Sodium: 139 mmol/L (ref 135–145)

## 2021-09-27 NOTE — ED Provider Notes (Signed)
[facility] EMERGENCY DEPARTMENT Provider Note   CSN: [id number] Arrival date & time: 09/27/21  0526     History  Chief Complaint  Patient presents with   Chest Pain    Colleen Williams is a 66 y.o. female with past medical history significant for hypertension, type 2 diabetes, obesity, hyperlipidemia who presents for left breast versus chest pain and burning since around 4 PM yesterday.  Patient denies tobacco use history.  Patient denies first-degree family history of ACS.  Patient denies previous ACS herself.  Patient reports pain in left breast/chest is burning in nature, comes on and off, but has been persistent since yesterday, is not associated with exertion.  She denies nausea, vomiting, diaphoresis.  Patient reports that she is more worried about breast pain versus mass than chest pain.  Patient reports that her last mammogram was around a year ago, with no acute findings.  Patient has not take anything for pain at this time.  Patient denies sour taste in the back of her throat, or any other heartburn or GERD symptoms.  Patient does report that she has had some persistent cough for the last few weeks, more prominent at night.  Patient denies recent travel.  Patient also reports that she had some issue with snoring, and has questions about what to do about snoring.   Chest Pain Associated symptoms: cough       Home Medications Prior to Admission medications   Medication Sig Start Date End Date Taking? Authorizing Provider  hydrochlorothiazide (HYDRODIURIL) 12.5 MG tablet Take 1 tablet (12.5 mg total) by mouth daily. 07/19/21   Sanjuan Dame, MD  metFORMIN (GLUCOPHAGE) 500 MG tablet Take 1 tablet (500 mg total) by mouth daily with breakfast. 07/19/21 08/18/21  Sanjuan Dame, MD  rosuvastatin (CRESTOR) 20 MG tablet Take 1 tablet (20 mg total) by mouth daily. 08/20/21 11/18/21  Idamae Schuller, MD      Allergies    Patient has no known allergies.    Review of  Systems   Review of Systems  Respiratory:  Positive for cough.   Cardiovascular:  Positive for chest pain.  All other systems reviewed and are negative.  Physical Exam Updated Vital Signs BP (!) 160/96    Pulse 92    Temp 98 F (36.7 C)    Resp (!) 24    Ht 5\' 6"  (1.676 m)    Wt 113.4 kg    SpO2 97%    BMI 40.35 kg/m  Physical Exam Vitals and nursing note reviewed.  Constitutional:      General: She is not in acute distress.    Appearance: Normal appearance. She is obese.  HENT:     Head: Normocephalic and atraumatic.  Eyes:     General:        Right eye: No discharge.        Left eye: No discharge.  Cardiovascular:     Rate and Rhythm: Normal rate and regular rhythm.     Heart sounds: No murmur heard.   No friction rub. No gallop.  Pulmonary:     Effort: Pulmonary effort is normal.     Breath sounds: Normal breath sounds.     Comments: No accessory lung sounds, no respiratory distress, no stridor, no wheezing, no rhonchi, no rales. Chest:     Comments: Some minimal tenderness to palpation of the left breast, no masses palpated.  No nipple discharge.  No redness, no erythema.  Chaperone present for breast exam.  Abdominal:     General: Bowel sounds are normal.     Palpations: Abdomen is soft.  Skin:    General: Skin is warm and dry.     Capillary Refill: Capillary refill takes less than 2 seconds.  Neurological:     Mental Status: She is alert and oriented to person, place, and time.  Psychiatric:        Mood and Affect: Mood normal.        Behavior: Behavior normal.    ED Results / Procedures / Treatments   Labs (all labs ordered are listed, but only abnormal results are displayed) Labs Reviewed  BASIC METABOLIC PANEL - Abnormal; Notable for the following components:      Result Value   Glucose, Bld 180 (*)    BUN 7 (*)    All other components within normal limits  CBC WITH DIFFERENTIAL/PLATELET - Abnormal; Notable for the following components:   RBC 5.33 (*)     Hemoglobin 15.3 (*)    All other components within normal limits  TROPONIN I (HIGH SENSITIVITY)  TROPONIN I (HIGH SENSITIVITY)    EKG None  Radiology DG Chest 2 View  Result Date: 09/27/2021 CLINICAL DATA:  CP EXAM: CHEST - 2 VIEW COMPARISON:  February 2018 FINDINGS: The cardiomediastinal silhouette is within normal limits. No pleural effusion. No pneumothorax. No mass or consolidation. No acute osseous abnormality. IMPRESSION: No acute findings in the chest. Electronically Signed   By: Albin Felling M.D.   On: 09/27/2021 06:51    Procedures Procedures    Medications Ordered in ED Medications - No data to display  ED Course/ Medical Decision Making/ A&P                           Medical Decision Making  Given the large differential diagnosis for Shaunette L Padin, the decision making in this case is of high complexity.  After evaluating all of the data points in this case, the presentation of Janiah L Bove is NOT consistent with Acute Coronary Syndrome (ACS) and/or myocardial ischemia, pulmonary embolism, aortic dissection; Borhaave's, significant arrythmia, pneumothorax, cardiac tamponade, or other emergent cardiopulmonary condition.  Further, the presentation of Breelyn L Boven is NOT consistent with pericarditis, myocarditis, cholecystitis, pancreatitis, mediastinitis, endocarditis, new valvular disease.  Additionally, the presentation of Jonel L Massaro is NOT consistent with flail chest, cardiac contusion, ARDS, or significant intra-thoracic or intra-abdominal bleeding.  Moreover, this presentation is NOT consistent with pneumonia, sepsis, or pyelonephritis.  The patient has a heart score of 4, however on my exam her story is not significant or consistent with a cardiac cause of chest pain.  Patient reports that she is more worried about a breast mass, reports a nonexertional, slightly burning sensation in the left breast near the nipple.  He did not palpate any mass.  We did  work her up for ACS, and she had a reassuring work-up.  Negative troponin x2.  Nonischemic EKG, unremarkable chest x-ray.  Personally reviewed his lab work and imaging, I agree with the radiologist interpretation of the x-ray.  Her BMP significant for a slightly elevated blood glucose of 180.  CBC is unremarkable.  Her pain is resolved on my evaluation.  Additionally she asked about snoring with sleep, given her body habitus of suspicion that she may have some component of obstructive sleep apnea.  Encouraged her to follow-up with primary care doctor to discuss potential treatment with CPAP machine.  Her  vital signs remarkable for some hypertension, with systolic of 579.  She had an isolated episode of tachypnea right after transportation from the lobby, however she was breathing at a normal rate on my examination.  No oxygen desaturation with ambulation.  Strict return and follow-up precautions have been given by me personally or by detailed written instruction given verbally by nursing staff using the teach back method to the patient/family/caregiver(s).  Data Reviewed/Counseling: I have reviewed the patient's vital signs, nursing notes, and other relevant tests/information. I had a detailed discussion regarding the historical points, exam findings, and any diagnostic results supporting the discharge diagnosis. I also discussed the need for outpatient follow-up and the need to return to the ED if symptoms worsen or if there are any questions or concerns that arise at home.  Final Clinical Impression(s) / ED Diagnoses Final diagnoses:  Chest pain, unspecified type    Rx / DC Orders ED Discharge Orders     None         Dorien Chihuahua 09/27/21 1728    Truddie Hidden, MD 10/02/21 2037

## 2021-09-27 NOTE — ED Triage Notes (Signed)
Pt c/o left breast pain that started yesterday at 4pm and a warm feeling.  No pain upon palpitation. Pain might be a uncomfortable.

## 2021-09-27 NOTE — ED Provider Triage Note (Signed)
Emergency Medicine Provider Triage Evaluation Note  Colleen Williams , a 66 y.o. female  was evaluated in triage.  Pt complains of left breast pain.  Patient reports at 4 PM yesterday she started having pain on the left side of her chest that she felt like was in her breast and she felt her breast felt warm but she did not do any overlying skin changes or feel any masses.  She reports pain has persisted.  No associated shortness of breath.  No fevers or chills.  Review of Systems  Positive: Breast pain, chest pain Negative: Fevers, shortness of breath  Physical Exam  BP (!) 161/81 (BP Location: Right Arm)    Pulse 90    Temp 98 F (36.7 C) (Oral)    Resp 19    Ht 5\' 6"  (1.676 m)    Wt 113.4 kg    SpO2 97%    BMI 40.35 kg/m  Gen:   Awake, no distress   Resp:  Normal effort, CTA bilateral Chest:  Left breast with no focal area of tenderness, unable to reproduce pain on palpation patient has no overlying skin changes or warmth.  Heart RRR MSK:   Moves extremities without difficulty  Other:    Medical Decision Making  Medically screening exam initiated at 6:20 AM.  Appropriate orders placed.  Colleen Williams was informed that the remainder of the evaluation will be completed by another provider, this initial triage assessment does not replace that evaluation, and the importance of remaining in the ED until their evaluation is complete.  Given that pain is not reproducible with palpation over the breast and there is no palpable masses will evaluate for chest pain   Jacqlyn Larsen, [place] 09/27/21 [phone]

## 2021-09-27 NOTE — Discharge Instructions (Signed)
As we discussed your work-up today did not show any evidence of heart attack, pneumonia, I did not feel any evidence of a breast lump that was suspicious for breast cancer.  I do recommend a follow-up for your regularly scheduled mammograms.  He had asked me about your snoring, as we discussed this can sometimes be a sign of obstructive sleep apnea, you may want to talk to your primary care provider about whether or not you may have obstructive sleep apnea, or could benefit from a CPAP machine.  It was a pleasure meeting you today, I hope that you feel better soon.  If you have any worsening chest pain, shortness of breath, or other worrisome symptoms please return to the emergency department for further evaluation.

## 2021-10-23 ENCOUNTER — Ambulatory Visit (INDEPENDENT_AMBULATORY_CARE_PROVIDER_SITE_OTHER): Payer: Medicare Other | Admitting: Internal Medicine

## 2021-10-23 ENCOUNTER — Encounter: Payer: Self-pay | Admitting: Internal Medicine

## 2021-10-23 ENCOUNTER — Other Ambulatory Visit: Payer: Self-pay

## 2021-10-23 VITALS — BP 120/80 | HR 93 | Temp 98.1°F | Ht 66.0 in | Wt 242.0 lb

## 2021-10-23 DIAGNOSIS — E119 Type 2 diabetes mellitus without complications: Secondary | ICD-10-CM

## 2021-10-23 DIAGNOSIS — I10 Essential (primary) hypertension: Secondary | ICD-10-CM

## 2021-10-23 LAB — GLUCOSE, CAPILLARY: Glucose-Capillary: 176 mg/dL — ABNORMAL HIGH (ref 70–99)

## 2021-10-23 LAB — POCT GLYCOSYLATED HEMOGLOBIN (HGB A1C): Hemoglobin A1C: 8.5 % — AB (ref 4.0–5.6)

## 2021-10-23 MED ORDER — METFORMIN HCL 500 MG PO TABS: 500.0000 mg | ORAL_TABLET | Freq: Every day | ORAL | 2 refills | Status: DC

## 2021-10-23 MED ORDER — HYDROCHLOROTHIAZIDE 12.5 MG PO TABS: 12.5000 mg | ORAL_TABLET | Freq: Every day | ORAL | 0 refills | Status: DC

## 2021-10-23 NOTE — Progress Notes (Signed)
° °  CC: routine follow-up  HPI:  Colleen Williams is a 66 y.o. with past medical history as noted below who presents to the clinic today for a routine follow-up. Please see problem-based list for further details, assessments, and plans.  Past Medical History:  Diagnosis Date   Arthritis    Blurry vision, bilateral 01/29/2019   Lumbar radiculopathy, chronic 06/12/2015   Vitreous floaters of both eyes 08/03/2017   Review of Systems: Negative aside from that listed in individualized problem based charting.  Physical Exam:  Vitals:   10/23/21 0852  BP: 120/80  Pulse: 93  Temp: 98.1 F (36.7 C)  TempSrc: Oral  SpO2: 97%  Weight: 242 lb (109.8 kg)  Height: 5\' 6"  (1.676 m)   General: NAD, nl appearance HE: Normocephalic, atraumatic, EOMI, Conjunctivae normal ENT: No congestion, no rhinorrhea, no exudate or erythema  Cardiovascular: Normal rate, regular rhythm. No murmurs, rubs, or gallops Pulmonary: Effort normal, breath sounds normal. No wheezes, rales, or rhonchi Abdominal: soft, nontender, bowel sounds present Musculoskeletal: no swelling, deformity, injury or tenderness in extremities Skin: Warm, dry, no bruising, erythema, or rash Psychiatric/Behavioral: normal mood, normal behavior    Assessment & Plan:   See Encounters Tab for problem based charting.  Patient discussed with Dr. Evette Doffing

## 2021-10-23 NOTE — Assessment & Plan Note (Addendum)
BP Readings from Last 3 Encounters:  10/23/21 120/80  09/27/21 (!) 160/96  08/20/21 131/82   Pt is here for 2 month BP and DM check. States that she does not check her BP often at home but when she feels dizzy or has a HA, she will check it. States they have been good at home but does not remember the values. Takes HCTZ 12.5 mg daily and is aware of our recommendation to take this daily but states that she takes it every other day.  Plan: BP well-controlled at this time. I will refill her HCTZ and have reemphasized our recommendation to take this daily. Pt is amenable to this plan. Will follow-up on BMP obtained today.

## 2021-10-23 NOTE — Patient Instructions (Addendum)
Thank you, Ms.Alishia Camelia Eng for allowing [place] to provide your care today. Today we discussed your diabetes.    Metformin: This week, I want you to take 500 mg once daily with supper. Next week (after 7 days), I want you to take 500 mg with breakfast and 500 mg with supper. The week after that, I want you to take 500 mg with breakfast and 1000 mg with supper. The week after that, I want you to take 1000 mg with breakfast and 1000 mg with supper. After that, you will continue 1000 mg twice daily. We will see you in 3 months for follow-up.   I have ordered the following labs for you:  Lab Orders         BMP8+Anion Gap         Glucose, capillary         POC Hbg A1C       Referrals ordered today:   Referral Orders         Ambulatory referral to Ophthalmology       I have ordered the following medication/changed the following medications:   Start the following medications: Meds ordered this encounter  Medications   hydrochlorothiazide (HYDRODIURIL) 12.5 MG tablet    Sig: Take 1 tablet (12.5 mg total) by mouth daily.    Dispense:  30 tablet    Refill:  0   metFORMIN (GLUCOPHAGE) 500 MG tablet    Sig: Take 1 tablet (500 mg total) by mouth daily with breakfast.    Dispense:  30 tablet    Refill:  2     Follow up: 3 months   Should you have any questions or concerns please call the internal medicine clinic at [phone].

## 2021-10-23 NOTE — Assessment & Plan Note (Addendum)
The patient is here today for DM check-up. During her last visit, plan was to continue metformin 500 mg daily with breakfast. She states she has been taking this every other day. Review of records shows that she has not picked up metformin refills recently. A1c today of 8.5, increased from 7.7 three months ago. Of note, pt would like a referral to ophthalmology for diabetic eye exam.  Plan: -Discussed with the patient that we will plan to restart metformin 500 mg daily with supper and then slowly uptitrate over the next few weeks. Given instructions on how to do this in her AVS paperwork. Pt states that, last time, she was having some leg pain with metformin and switched herself to every other day, however she expressed to me how she would like to retry daily metformin and see how this goes before making any other changes. Will restart metformin 500 mg daily for now and will follow-up with the patient later in the week to see how she tolerates the medication. Otherwise, follow-up in 3 months. -Referral to ophthalmology was placed today   Addendum: Contacted patient about how she is tolerating metformin. States that she is tolerating it well and does not have any leg pain. Reiterated specific instructions on check-out paperwork on how to titrate up her metformin. Pt is in agreement with this plan.

## 2021-10-24 LAB — BMP8+ANION GAP
Anion Gap: 13 mmol/L (ref 10.0–18.0)
BUN/Creatinine Ratio: 17 (ref 12–28)
BUN: 13 mg/dL (ref 8–27)
CO2: 23 mmol/L (ref 20–29)
Calcium: 9.1 mg/dL (ref 8.7–10.3)
Chloride: 102 mmol/L (ref 96–106)
Creatinine, Ser: 0.76 mg/dL (ref 0.57–1.00)
Glucose: 181 mg/dL — ABNORMAL HIGH (ref 70–99)
Potassium: 4.8 mmol/L (ref 3.5–5.2)
Sodium: 138 mmol/L (ref 134–144)
eGFR: 87 mL/min/{1.73_m2} (ref >59)

## 2021-10-24 MED ORDER — METFORMIN HCL 1000 MG PO TABS: 1000.0000 mg | ORAL_TABLET | Freq: Two times a day (BID) | ORAL | 11 refills | Status: DC

## 2021-10-24 NOTE — Progress Notes (Signed)
Internal Medicine Clinic Attending ° °Case discussed with Dr. Bonanno  °  At the time of the visit.  We reviewed the resident’s history and exam and pertinent patient test results.  I agree with the assessment, diagnosis, and plan of care documented in the resident’s note. ° °

## 2021-10-24 NOTE — Addendum Note (Signed)
Addended by: Orvis Brill on: 10/24/2021 04:05 PM   Modules accepted: Orders

## 2021-10-27 ENCOUNTER — Other Ambulatory Visit: Payer: Self-pay

## 2021-10-27 ENCOUNTER — Ambulatory Visit
Admission: EM | Admit: 2021-10-27 | Discharge: 2021-10-27 | Disposition: A | Discharge status: Home / Self Care | Payer: Medicare Other | Attending: Internal Medicine | Admitting: Internal Medicine

## 2021-10-27 ENCOUNTER — Encounter: Payer: Self-pay | Admitting: Emergency Medicine

## 2021-10-27 DIAGNOSIS — R062 Wheezing: Secondary | ICD-10-CM

## 2021-10-27 DIAGNOSIS — R051 Acute cough: Secondary | ICD-10-CM | POA: Diagnosis not present

## 2021-10-27 MED ORDER — ALBUTEROL SULFATE HFA 108 (90 BASE) MCG/ACT IN AERS: 1.0000 | INHALATION_SPRAY | Freq: Four times a day (QID) | RESPIRATORY_TRACT | 0 refills | Status: DC | PRN

## 2021-10-27 MED ORDER — BENZONATATE 100 MG PO CAPS
100.0000 mg | ORAL_CAPSULE | Freq: Three times a day (TID) | ORAL | 0 refills | Status: DC | PRN
Start: 2021-10-27 — End: 2022-01-21

## 2021-10-27 NOTE — ED Triage Notes (Signed)
Cough starting yesterday, woke up to productive cough with white phlegm. Denies fever, sore throat, nasal congestion, headache, body aches, N/V/D, chest pain, SOB, palpitations, ear pain, facial pain. Last time she had these symptoms she had bronchitis

## 2021-10-27 NOTE — Discharge Instructions (Signed)
You have been prescribed albuterol inhaler to take as needed for wheezing or shortness of breath.  You have also been prescribed a cough medication to take as needed.  Please follow-up if symptoms persist or worsen.  COVID test is pending.  We will call if it is positive.

## 2021-10-27 NOTE — ED Provider Notes (Signed)
[facility]    CSN: [id number] Arrival date & time: 10/27/21  0941      History   Chief Complaint Chief Complaint  Patient presents with   Cough    HPI Charnee ANVITA HIRATA is a 66 y.o. female.   Patient presents with cough and wheezing that started yesterday.  Patient reports that wheezing is intermittent.  Denies any associated upper respiratory symptoms, sore throat, ear pain, fever, nausea, vomiting, diarrhea, abdominal pain.  Denies any known sick contacts.  Denies shortness of breath.  Patient reports that she does have a history of bronchitis but denies history of asthma or COPD.   Cough  Past Medical History:  Diagnosis Date   Arthritis    Blurry vision, bilateral 01/29/2019   Lumbar radiculopathy, chronic 06/12/2015   Vitreous floaters of both eyes 08/03/2017    Patient Active Problem List   Diagnosis Date Noted   Hyperlipidemia associated with type 2 diabetes mellitus ([place]) 11/12/2019   Essential hypertension 08/03/2017   Type 2 diabetes mellitus ([place]) 01/30/2017   Obesity, Class III, BMI 40-49.9 (morbid obesity) ([place]) 01/30/2017   Osteoarthritis of ankle and foot 07/30/2016   Healthcare maintenance 06/12/2015    Past Surgical History:  Procedure Laterality Date   TUBAL LIGATION      OB History     Gravida  2   Para      Term      Preterm      AB      Living  2      SAB      IAB      Ectopic      Multiple      Live Births  2            Home Medications    Prior to Admission medications   Medication Sig Start Date End Date Taking? Authorizing Provider  albuterol (VENTOLIN HFA) 108 (90 Base) MCG/ACT inhaler Inhale 1-2 puffs into the lungs every 6 (six) hours as needed for wheezing or shortness of breath. 10/27/21  Yes Aneesah Hernan, Hildred Alamin E, FNP  benzonatate (TESSALON) 100 MG capsule Take 1 capsule (100 mg total) by mouth every 8 (eight) hours as needed for cough. 10/27/21  Yes Tinna Kolker, Hildred Alamin E, FNP  hydrochlorothiazide  (HYDRODIURIL) 12.5 MG tablet Take 1 tablet (12.5 mg total) by mouth daily. 10/23/21  Yes Orvis Brill, MD  metFORMIN (GLUCOPHAGE) 1000 MG tablet Take 1 tablet (1,000 mg total) by mouth 2 (two) times daily with a meal. 10/24/21 10/24/22 Yes Orvis Brill, MD  rosuvastatin (CRESTOR) 20 MG tablet Take 1 tablet (20 mg total) by mouth daily. 08/20/21 11/18/21  Idamae Schuller, MD    Family History Family History  Problem Relation Age of Onset   Diabetes Mother    Colon cancer Neg Hx    Colon polyps Neg Hx    Esophageal cancer Neg Hx    Stomach cancer Neg Hx    Rectal cancer Neg Hx    Breast cancer Neg Hx     Social History Social History   Tobacco Use   Smoking status: Former    Types: Cigarettes    Quit date: 09/16/2008    Years since quitting: 13.1   Smokeless tobacco: Former  Scientific laboratory technician Use: Never used  Substance Use Topics   Alcohol use: Yes    Alcohol/week: 0.0 standard drinks    Comment: Occasionally wine.   Drug use: No     Allergies  Patient has no known allergies.   Review of Systems Review of Systems Per HPI  Physical Exam Triage Vital Signs ED Triage Vitals  Enc Vitals Group     BP 10/27/21 1028 133/86     Pulse Rate 10/27/21 1028 94     Resp 10/27/21 1028 16     Temp 10/27/21 1028 98.1 F (36.7 C)     Temp Source 10/27/21 1028 Oral     SpO2 10/27/21 1028 93 %     Weight --      Height --      Head Circumference --      Peak Flow --      Pain Score 10/27/21 1029 0     Pain Loc --      Pain Edu? --      Excl. in [place]? --    No data found.  Updated Vital Signs BP 133/86 (BP Location: Left Arm)    Pulse 94    Temp 98.1 F (36.7 C) (Oral)    Resp 16    SpO2 93%   Visual Acuity Right Eye Distance:   Left Eye Distance:   Bilateral Distance:    Right Eye Near:   Left Eye Near:    Bilateral Near:     Physical Exam Constitutional:      General: She is not in acute distress.    Appearance: Normal appearance. She is not  toxic-appearing or diaphoretic.  HENT:     Head: Normocephalic and atraumatic.     Right Ear: Tympanic membrane and ear canal normal.     Left Ear: Tympanic membrane and ear canal normal.     Nose: Congestion present.     Mouth/Throat:     Mouth: Mucous membranes are moist.     Pharynx: No posterior oropharyngeal erythema.  Eyes:     Extraocular Movements: Extraocular movements intact.     Conjunctiva/sclera: Conjunctivae normal.     Pupils: Pupils are equal, round, and reactive to light.  Cardiovascular:     Rate and Rhythm: Normal rate and regular rhythm.     Pulses: Normal pulses.     Heart sounds: Normal heart sounds.  Pulmonary:     Effort: Pulmonary effort is normal. No respiratory distress.     Breath sounds: No stridor. Wheezing present. No rhonchi or rales.  Abdominal:     General: Abdomen is flat. Bowel sounds are normal.     Palpations: Abdomen is soft.  Musculoskeletal:        General: Normal range of motion.     Cervical back: Normal range of motion.  Skin:    General: Skin is warm and dry.  Neurological:     General: No focal deficit present.     Mental Status: She is alert and oriented to person, place, and time. Mental status is at baseline.  Psychiatric:        Mood and Affect: Mood normal.        Behavior: Behavior normal.     UC Treatments / Results  Labs (all labs ordered are listed, but only abnormal results are displayed) Labs Reviewed  NOVEL CORONAVIRUS, NAA    EKG   Radiology No results found.  Procedures Procedures (including critical care time)  Medications Ordered in UC Medications - No data to display  Initial Impression / Assessment and Plan / UC Course  I have reviewed the triage vital signs and the nursing notes.  Pertinent labs & imaging results that were  available during my care of the patient were reviewed by me and considered in my medical decision making (see chart for details).     Patient's symptoms appear viral in  etiology.  No suspicion for cardiopulmonary process such as pneumonia at this time.  There is very mild wheezing on exam so will treat with albuterol inhaler.  Do not think that prednisone is necessary at this time.  Benzonatate also prescribed to help alleviate cough.  Vital signs stable today.  COVID test pending.  Discussed return precautions.  Patient verbalized understanding and was agreeable with plan. Final Clinical Impressions(s) / UC Diagnoses   Final diagnoses:  Acute cough  Wheezing     Discharge Instructions      You have been prescribed albuterol inhaler to take as needed for wheezing or shortness of breath.  You have also been prescribed a cough medication to take as needed.  Please follow-up if symptoms persist or worsen.  COVID test is pending.  We will call if it is positive.    ED Prescriptions     Medication Sig Dispense Auth. Provider   albuterol (VENTOLIN HFA) 108 (90 Base) MCG/ACT inhaler Inhale 1-2 puffs into the lungs every 6 (six) hours as needed for wheezing or shortness of breath. 1 each [place], [place] E, [place]   benzonatate (TESSALON) 100 MG capsule Take 1 capsule (100 mg total) by mouth every 8 (eight) hours as needed for cough. 21 capsule [place], Michele Rockers, [place]      PDMP not reviewed this encounter.   Teodora Medici, [place] 10/27/21 [phone]

## 2021-10-29 LAB — NOVEL CORONAVIRUS, NAA: SARS-CoV-2, NAA: NOT DETECTED

## 2021-11-02 DIAGNOSIS — H25813 Combined forms of age-related cataract, bilateral: Secondary | ICD-10-CM | POA: Diagnosis not present

## 2021-11-02 DIAGNOSIS — H40013 Open angle with borderline findings, low risk, bilateral: Secondary | ICD-10-CM | POA: Diagnosis not present

## 2021-11-02 DIAGNOSIS — H524 Presbyopia: Secondary | ICD-10-CM | POA: Diagnosis not present

## 2021-11-02 DIAGNOSIS — E119 Type 2 diabetes mellitus without complications: Secondary | ICD-10-CM | POA: Diagnosis not present

## 2021-11-02 LAB — HM DIABETES EYE EXAM

## 2021-11-06 ENCOUNTER — Encounter (INDEPENDENT_AMBULATORY_CARE_PROVIDER_SITE_OTHER): Payer: Medicare Other | Admitting: Ophthalmology

## 2021-12-31 ENCOUNTER — Other Ambulatory Visit: Payer: Self-pay

## 2021-12-31 ENCOUNTER — Other Ambulatory Visit: Payer: Self-pay | Admitting: Internal Medicine

## 2021-12-31 DIAGNOSIS — I10 Essential (primary) hypertension: Secondary | ICD-10-CM

## 2021-12-31 NOTE — Telephone Encounter (Signed)
hydrochlorothiazide (HYDRODIURIL) 12.5 MG tablet, REFILL REQUEST @ [facility] ([place]), [place]. ?

## 2022-01-02 MED ORDER — HYDROCHLOROTHIAZIDE 12.5 MG PO TABS: 12.5000 mg | ORAL_TABLET | Freq: Every day | ORAL | 1 refills | Status: DC

## 2022-01-02 NOTE — Telephone Encounter (Signed)
Patient calling back for refill on HCTZ. States her feet are swollen and needs this by today. Of note, last refill sent 2/7 for 30 days only. ?

## 2022-01-02 NOTE — Telephone Encounter (Signed)
Patient notified refill has been sent. She is very Patent attorney. ?

## 2022-01-09 ENCOUNTER — Encounter: Payer: Self-pay | Admitting: Dietician

## 2022-01-21 ENCOUNTER — Encounter: Payer: Self-pay | Admitting: Internal Medicine

## 2022-01-21 ENCOUNTER — Ambulatory Visit (INDEPENDENT_AMBULATORY_CARE_PROVIDER_SITE_OTHER): Payer: Medicare Other | Admitting: Internal Medicine

## 2022-01-21 ENCOUNTER — Other Ambulatory Visit: Payer: Self-pay

## 2022-01-21 VITALS — BP 138/84 | HR 92 | Temp 97.6°F | Ht 65.0 in | Wt 235.0 lb

## 2022-01-21 DIAGNOSIS — Z0000 Encounter for general adult medical examination without abnormal findings: Secondary | ICD-10-CM

## 2022-01-21 DIAGNOSIS — I10 Essential (primary) hypertension: Secondary | ICD-10-CM

## 2022-01-21 DIAGNOSIS — Z8709 Personal history of other diseases of the respiratory system: Secondary | ICD-10-CM | POA: Diagnosis not present

## 2022-01-21 DIAGNOSIS — E1169 Type 2 diabetes mellitus with other specified complication: Secondary | ICD-10-CM

## 2022-01-21 DIAGNOSIS — E785 Hyperlipidemia, unspecified: Secondary | ICD-10-CM

## 2022-01-21 DIAGNOSIS — E119 Type 2 diabetes mellitus without complications: Secondary | ICD-10-CM

## 2022-01-21 LAB — POCT GLYCOSYLATED HEMOGLOBIN (HGB A1C): Hemoglobin A1C: 9.1 % — AB (ref 4.0–5.6)

## 2022-01-21 LAB — GLUCOSE, CAPILLARY: Glucose-Capillary: 278 mg/dL — ABNORMAL HIGH (ref 70–99)

## 2022-01-21 MED ORDER — BENZONATATE 100 MG PO CAPS: 100.0000 mg | ORAL_CAPSULE | Freq: Three times a day (TID) | ORAL | 0 refills | Status: DC | PRN

## 2022-01-21 MED ORDER — FREESTYLE SYSTEM KIT: 1.0000 | PACK | 1 refills | Status: DC | PRN

## 2022-01-21 MED ORDER — ROSUVASTATIN CALCIUM 20 MG PO TABS: 20.0000 mg | ORAL_TABLET | Freq: Every day | ORAL | 3 refills | Status: DC

## 2022-01-21 MED ORDER — FREESTYLE LANCETS MISC: 12 refills | Status: DC

## 2022-01-21 MED ORDER — SYNJARDY 5-500 MG PO TABS: ORAL_TABLET | ORAL | 0 refills | Status: DC

## 2022-01-21 NOTE — Assessment & Plan Note (Signed)
Patient's weight 235lbs, down 7lbs since last appointment in February. Patient would benefit from GLP-1 agonist though patient will not consider doing self injections. May benefit from oral GLP 1 agonist. Patient currently working on weight loss goals by making healthier dietary choices and eating smaller portions.   ?

## 2022-01-21 NOTE — Patient Instructions (Signed)
Thank you, Ms.Nzinga Camelia Eng for allowing [place] to provide your care today. Today we discussed: ? ?Diabetes: Your hemoglobin A1c has worsened to 9.1%.  We will start a medication called Synjardy which contains both metformin and empagliflozin. ? ?Please take 5-500 mg tablet once a day for 1 week ?After the week is over, please take 5-500 mg tablet once in the morning and once in the evening until we see you back in clinic in 1 month. ? ?I will order a glucometer for you.  Please take your blood sugars once in the morning before breakfast, this is your fasting blood sugar. ? ?Please take your hydrochlorothiazide for high blood pressure once a day ?Please take your Crestor for high cholesterol once a day. ? ?We will need to see you back in 1 to 2 weeks for a Pap smear. ? ?Please see a dentist, Dr. Debria Garret has an office here in the area. ? ?I have ordered the following labs for you: ? ? ?Lab Orders    ?     Glucose, capillary    ?     POC Hbg A1C     ? ?I will call if any are abnormal. All of your labs can be accessed through "My Chart". ? ?I ?My Chart Access: ?[email or web address] ? ?Please follow-up in 1 to 2 weeks for your Pap smear appointment, and 1 month for your follow-up appointment. ? ?Please make sure to arrive 15 minutes prior to your next appointment. If you arrive late, you may be asked to reschedule.  ?  ?We look forward to seeing you next time. Please call our clinic at [phone] if you have any questions or concerns. The best time to call is Monday-Friday from 9am-4pm, but there is someone available 24/7. If after hours or the weekend, call the main hospital number and ask for the Internal Medicine Resident On-Call. If you need medication refills, please notify your pharmacy one week in advance and they will send [place] a request. ?  ?Thank you for letting us take part in your care. Wishing you the best! ? ?Wayland Denis, MD ?01/21/2022, 9:24 AM ?IM Resident,  PGY-1 ? ?

## 2022-01-21 NOTE — Assessment & Plan Note (Addendum)
Patient presents for 77-monthT2DM follow-up.  Last hemoglobin A1c 8.5% February 2023.  Hemoglobin A1c today 9.1%.  Patient does not have a glucometer and is not checking at home.  At last appointment she was told to start metformin, taking it daily instead of every other day.  She was provided with a titration schedule to increase metformin to 1000 mg twice daily.  Patient reports she has not taken metformin in over a month.  Per chart review has plenty of refills available.  Endorses polydipsia, polyuria. ? ?On assessment, type 2 diabetes is poorly controlled due to medication nonadherence.  Suspect patient will need additional medication in addition to metformin to control her blood sugars.  Discussed importance of taking diabetes medication daily to control blood sugars and reduce the risk of future cardiovascular events. ?Plan: ?-Start Synjardy 5-500 mg daily for 1 week ?-After 1 week increase to-500 mg twice daily until follow-up ?-Follow up in one month for CBG check ?-New glucometer free style with lancets ?

## 2022-01-21 NOTE — Assessment & Plan Note (Signed)
Patient on Crestor '20mg'$  daily for HLD. Last lipid panel 07/2021 with LDL 117. Patient endorses only intermittent adherent to medication regimen. Does not reports side effects.  ?Plan: ?Encouraged adherence to regimen ?Continue Crestor '20mg'$  daily ?Next lipid panel in June at next OV ?

## 2022-01-21 NOTE — Progress Notes (Signed)
?  CC: 3 month routine follow up ? ?HPI: ? ?[name] is a 66 y.o. female with a past medical history stated below and presents today for 78-monthroutine follow-up. Please see problem based assessment and plan for additional details. ? ?Past Medical History:  ?Diagnosis Date  ? Arthritis   ? Blurry vision, bilateral 01/29/2019  ? Lumbar radiculopathy, chronic 06/12/2015  ? Vitreous floaters of both eyes 08/03/2017  ? ? ?Current Outpatient Medications on File Prior to Visit  ?Medication Sig Dispense Refill  ? albuterol (VENTOLIN HFA) 108 (90 Base) MCG/ACT inhaler Inhale 1-2 puffs into the lungs every 6 (six) hours as needed for wheezing or shortness of breath. 1 each 0  ? benzonatate (TESSALON) 100 MG capsule Take 1 capsule (100 mg total) by mouth every 8 (eight) hours as needed for cough. 21 capsule 0  ? hydrochlorothiazide (HYDRODIURIL) 12.5 MG tablet Take 1 tablet (12.5 mg total) by mouth daily. 90 tablet 1  ? metFORMIN (GLUCOPHAGE) 1000 MG tablet Take 1 tablet (1,000 mg total) by mouth 2 (two) times daily with a meal. 60 tablet 11  ? rosuvastatin (CRESTOR) 20 MG tablet Take 1 tablet (20 mg total) by mouth daily. 90 tablet 0  ? ?No current facility-administered medications on file prior to visit.  ? ? ?Family History  ?Problem Relation Age of Onset  ? Diabetes Mother   ? Colon cancer Neg Hx   ? Colon polyps Neg Hx   ? Esophageal cancer Neg Hx   ? Stomach cancer Neg Hx   ? Rectal cancer Neg Hx   ? Breast cancer Neg Hx   ? ? ?Review of Systems: ?ROS negative except for what is noted on the assessment and plan. ? ?Vitals:  ? 01/21/22 0839  ?BP: 138/84  ?Pulse: 92  ?Temp: 97.6 ?F (36.4 ?C)  ?TempSrc: Oral  ?SpO2: 94%  ?Weight: 235 lb (106.6 kg)  ? ? ? ?Physical Exam: ?General: Well appearing obese African-American female, NAD ?HENT: normocephalic, atraumatic, external ears and nares appear normal ?EYES: conjunctiva non-erythematous, no scleral icterus ?CV: regular rate, normal rhythm, no murmurs, rubs, gallops.  Trace LE edema bilaterally. ?Pulmonary: normal work of breathing on RA, lungs clear to auscultation, no rales, wheezes, rhonchi ?Abdominal: non-distended, soft, non-tender to palpation, normal BS ?Skin: Warm and dry, no rashes or lesions ?Neurological: ?MS: awake, alert and oriented x3, normal speech and fund of knowledge ?Motor: moves all extremities antigravity ?Psych: normal affect ? ? ? ?Assessment & Plan:  ? ?See Encounters Tab for problem based charting. ? ?Patient discussed with Dr. NDareen Piano? ?EWayland Denis M.D. ?COphthal[facility] Of [facility] LLCInternal Medicine, PGY-1 ?Pager: [phone]?Date 01/21/2022 Time 8:42 AM ? ?

## 2022-01-21 NOTE — Assessment & Plan Note (Deleted)
Patient presents for 90-monthT2DM follow-up.  Last hemoglobin A1c 8.5% February 2023.  Hemoglobin A1c today 9.1%.  Patient does not have a glucometer and is not checking at home.  At last appointment she was told to start metformin, taking it daily instead of every other day.  She was provided with a titration schedule to increase metformin to 1000 mg twice daily.  Patient reports she has not taken metformin in over a month.  Per chart review has ?

## 2022-01-21 NOTE — Assessment & Plan Note (Signed)
Patient's BP today 138/84. Patient reports adherence to HCTZ 12.5m daily. Last BMP with normal BUN, Creatinine and eGFR, and normal electrolytes.  ? ?Patient's blood pressures fairly well controlled at this time.  ?Plan: ?-Continue HCTZ daily ?

## 2022-01-21 NOTE — Assessment & Plan Note (Addendum)
Patient instructed to make a separate appointment in 1-2 weeks to complete pap smear. Has appointment with myself 5/22 for this. ? ?At next OV f/u on patient's DEXA scan and recommend vaccination ?

## 2022-01-22 NOTE — Progress Notes (Signed)
Internal Medicine Clinic Attending ? ?Case discussed with Dr. Zinoviev  At the time of the visit.  We reviewed the resident?s history and exam and pertinent patient test results.  I agree with the assessment, diagnosis, and plan of care documented in the resident?s note.  ?

## 2022-02-04 ENCOUNTER — Telehealth: Payer: Self-pay | Admitting: *Deleted

## 2022-02-04 ENCOUNTER — Encounter: Payer: Medicare Other | Admitting: Internal Medicine

## 2022-02-04 NOTE — Telephone Encounter (Signed)
Spoke with patient regarding her missed appointment/ patient states she feels she do not need a paper smear at her age. I suggested to patient it would be good to have a pap done since she has not had one she states in 2 years. Will let her doctor know .

## 2022-02-05 ENCOUNTER — Encounter: Payer: Self-pay | Admitting: Student

## 2022-02-25 ENCOUNTER — Ambulatory Visit (INDEPENDENT_AMBULATORY_CARE_PROVIDER_SITE_OTHER): Payer: Medicare Other | Admitting: Student

## 2022-02-25 VITALS — BP 128/80 | HR 82 | Wt 226.0 lb

## 2022-02-25 DIAGNOSIS — Z7984 Long term (current) use of oral hypoglycemic drugs: Secondary | ICD-10-CM | POA: Diagnosis not present

## 2022-02-25 DIAGNOSIS — E1169 Type 2 diabetes mellitus with other specified complication: Secondary | ICD-10-CM

## 2022-02-25 DIAGNOSIS — E785 Hyperlipidemia, unspecified: Secondary | ICD-10-CM | POA: Diagnosis not present

## 2022-02-25 DIAGNOSIS — E119 Type 2 diabetes mellitus without complications: Secondary | ICD-10-CM

## 2022-02-25 DIAGNOSIS — Z87891 Personal history of nicotine dependence: Secondary | ICD-10-CM

## 2022-02-25 DIAGNOSIS — I10 Essential (primary) hypertension: Secondary | ICD-10-CM | POA: Diagnosis not present

## 2022-02-25 MED ORDER — ROSUVASTATIN CALCIUM 20 MG PO TABS: 20.0000 mg | ORAL_TABLET | Freq: Every day | ORAL | 3 refills | Status: DC

## 2022-02-25 NOTE — Progress Notes (Signed)
   CC: Follow-up  HPI:  Ms.Sidnie L Alpert is a 66 y.o. female with PMH as below who presents to clinic to follow-up on her diabetes, blood pressure and cholesterol. Please see problem based charting for evaluation, assessment and plan.  Past Medical History:  Diagnosis Date   Arthritis    Blurry vision, bilateral 01/29/2019   Lumbar radiculopathy, chronic 06/12/2015   Vitreous floaters of both eyes 08/03/2017    Review of Systems:  Constitutional: Positive for polydipsia.  Negative for fatigue. Eyes: Negative for visual changes MSK: Negative for back pain GU: Positive for polyuria Abdomen: Negative for abdominal pain, constipation or diarrhea Neuro: Negative for headache, dizziness or weakness  Physical Exam: General: Pleasant, well-appearing elderly woman. No acute distress. Cardiac: RRR. No murmurs, rubs or gallops. No LE edema Respiratory: Lungs CTAB. No wheezing or crackles. Skin: Warm, dry and intact without rashes or lesions Extremities: Atraumatic.  Radial and DP pulses 2+ and symmetric. Neuro: A&O x 3. Moves all extremities. Normal sensation to gross touch. Psych: Appropriate mood and affect.  Vitals:   02/25/22 0840  BP: 128/80  Pulse: 82  SpO2: 97%  Weight: 226 lb (102.5 kg)    Assessment & Plan:   See Encounters Tab for problem based charting.  Patient discussed with Dr.  Louann Liv, MD, MPH

## 2022-02-25 NOTE — Patient Instructions (Signed)
Thank you, Ms.Sabrie Camelia Eng for allowing [place] to provide your care today. Today we discussed your diabetes, blood pressure and cholesterol. Please pick up your cholesterol medicine. Congratulations on your continuous weight loss.     We will have our diabetic coordinator call you to place your CGM so we can monitor your blood sugar.   My Chart Access: [email or web address]  Please follow-up with Butch Penny later this week for CGM placement  Please make sure to arrive 15 minutes prior to your next appointment. If you arrive late, you may be asked to reschedule.    We look forward to seeing you next time. Please call our clinic at [phone] if you have any questions or concerns. The best time to call is Monday-Friday from 9am-4pm, but there is someone available 24/7. If after hours or the weekend, call the main hospital number and ask for the Internal Medicine Resident On-Call. If you need medication refills, please notify your pharmacy one week in advance and they will send [place] a request.   Thank you for letting us take part in your care. Wishing you the best!  Lacinda Axon, MD 02/25/2022, 9:17 AM IM Resident, PGY-2 [place] [facility]ian 41:10

## 2022-02-26 ENCOUNTER — Telehealth: Payer: Self-pay | Admitting: Dietician

## 2022-02-26 ENCOUNTER — Encounter: Payer: Self-pay | Admitting: Student

## 2022-02-26 MED ORDER — SYNJARDY 5-500 MG PO TABS: 1.0000 | ORAL_TABLET | Freq: Two times a day (BID) | ORAL | 2 refills | Status: AC

## 2022-02-26 NOTE — Telephone Encounter (Signed)
Scheduled for Professional CGM placement tomorrow at 845 AM.

## 2022-02-26 NOTE — Assessment & Plan Note (Signed)
Patient presented today for follow-up on her diabetes.  A1c increased to 9.1% last month.  Patient was ordered a glucose meter and asked to check blood sugars at home before this appointment. Patient reports she does have the meter but has not checked her blood sugar. States she does not like the idea of pricking herself. She was started on Synjardy which she states she has been adherent to. States she has been working on eating better and decreasing her portion size. She has lost 9 pounds in the past 4 weeks. Current weight is 226 pounds. States her goal is to lose about 40-50 more pounds.  I congratulated her on her weight loss and encouraged her to continue lifestyle modifications and take her medications as prescribed.  Discussed having a CGM placed by Butch Penny and patient states she will prefer diet.  Messaged Butch Penny to reach out to patient about placing CGM.  Plan: -Continue Synjardy 5-500 mg twice daily -Follow-up visit with Butch Penny for CGM placement, 1 week office visit after that -Formal referral to diabetic educator -Repeat A1c in 2 months -Encouraged to continue lifestyle modifications

## 2022-02-26 NOTE — Assessment & Plan Note (Signed)
Patient with a history of nonadherence to medication. States she has not picked up her last prescription for her Crestor.  Stressed to patient the importance of taking all her medications as prescribed.  Emphasized her risk of stroke and heart attacks with elevated cholesterol levels.  Patient states she plans to go pick up prescription after visit. -Resent prescription of Crestor 20 mg to pharmacy -Lipid panel in 3 months.

## 2022-02-26 NOTE — Assessment & Plan Note (Signed)
Well-controlled blood pressure of 128/80 today.  Kidney function stable few months ago. -Continue HCTZ 12.5 mg daily -Repeat BMP in 3 months.

## 2022-02-26 NOTE — Telephone Encounter (Signed)
Thank you Donna 

## 2022-02-27 ENCOUNTER — Encounter: Payer: Medicare Other | Admitting: Dietician

## 2022-02-27 NOTE — Progress Notes (Signed)
Internal Medicine Clinic Attending  Case discussed with Dr. A[place]onsah  At the time of the visit.  We reviewed the resident's history and exam and pertinent patient test results.  I agree with the assessment, diagnosis, and plan of care documented in the resident's note.  

## 2022-03-25 ENCOUNTER — Ambulatory Visit (INDEPENDENT_AMBULATORY_CARE_PROVIDER_SITE_OTHER): Payer: Medicare Other | Admitting: Dietician

## 2022-03-25 ENCOUNTER — Encounter: Payer: Self-pay | Admitting: Dietician

## 2022-03-25 DIAGNOSIS — E119 Type 2 diabetes mellitus without complications: Secondary | ICD-10-CM | POA: Diagnosis not present

## 2022-03-25 DIAGNOSIS — Z7984 Long term (current) use of oral hypoglycemic drugs: Secondary | ICD-10-CM

## 2022-03-25 NOTE — Patient Instructions (Signed)
You are doing a great job with weight loss and eating healthy!   I recommend a short walk every day, increase it as you can up to 30 minutes a day.   Check your blood sugar at different time once a day.   Please make a doctor appointment in August in about 1 month (after 04/23/22) to have your A1C  Then you will know how your blood sugar is doing most of the day.   Colleen Williams [phone]

## 2022-03-25 NOTE — Progress Notes (Signed)
Medical Nutrition Therapy :  Appt start time: 9518 end time:  8416. Total time: 40 Visit # 1  Assessment:  Primary concerns today:  Colleen Williams was scheduled for an appointment to have a professional Continuous glucose monitoring.  She states today that she does not want to do that. She requests help learning how to eat right and use her glucometer.  " I want to learn what to do from my doctor not from other people." She has lost 24# in the past 6 months by changing her portions and what she eats. Her blood sugar is 112 today after 42 grams carbs of "Breakfast Biscuit". She is thinking about moving more but has not started.  She states she has a small headache and her shin has been sore but not discolored and wonders if these are from the new medicine. She was not ready to schedule a follow up today, but was appreciative of having my phone number to call for questions. She asked that we cancel her appointment with [facility]. Educator noted difficulty with reading and writing during visit.   Preferred Learning Style: No preference indicated  Learning Readiness: Change in progress  ANTHROPOMETRICS: Estimated body mass index is 37.61 kg/m as calculated from the following:   Height as of 01/21/22: '5\' 5"'$  (1.651 m).   Weight as of 02/25/22: 226 lb (102.5 kg).  WEIGHT HISTORY: Highest: happy with current rate of weight loss.  Wt Readings from Last 10 Encounters:  02/25/22 226 lb (102.5 kg)  01/21/22 235 lb (106.6 kg)  10/23/21 242 lb (109.8 kg)  09/27/21 250 lb (113.4 kg)  08/20/21 252 lb 8 oz (114.5 kg)  07/18/21 253 lb 4.8 oz (114.9 kg)  10/12/20 240 lb (108.9 kg)  03/14/20 265 lb 8 oz (120.4 kg)  02/10/20 253 lb 3.2 oz (114.9 kg)  11/11/19 255 lb 4.8 oz (115.8 kg)   SLEEP:need to assess at future visit MEDICATIONS: brought them with her today and wanted to review them. Is taking as directed; abl to state what they are fo BLOOD SUGAR: 112 after breakfast today,  Lab Results  Component Value Date    HGBA1C 9.1 (A) 01/21/2022   HGBA1C 8.5 (A) 10/23/2021   HGBA1C 7.7 (A) 07/18/2021   HGBA1C 7.1 (A) 02/10/2020   HGBA1C 6.8 (A) 11/11/2019     DIETARY INTAKE: Usual eating pattern includes 3 meals and 1-2 snacks per day. Everyday foods include fruit and vegetables, chicken, bread, peanut butter.  Avoided foods include ice cream, pasta.  F Dining Out (times/week): unsure 24-hr recall:  B ( AM): breakfast peanut butter biscuit ( 42 g carb) L ( PM): half a sandwich, fruit D ( PM): small pice of steak ( says she mostly eats chicken), potatoes Snk ( PM): fruit Beverages: tired of water, loves coffee, but stopped drinking due to her diabetes  Usual physical activity: Activities of daily living  Progress Towards Goal(s):  Some progress.   Nutritional Diagnosis:  NB-1.1 Food and nutrition-related knowledge deficit As related to lack of sufficient diabetes and meal planning education.  As evidenced by her report. .    Intervention:  Nutrition education about self monitoring, meal planning, physical activity. . Action Goal: see patient instructions, check a few times a week to daily, eat balanced meals, do not use non nutritive sweeteners, move more daily  Outcome goal: improved knowledge and decreased risk of heart disease Coordination of care: notify her doctor of her desire to not have professional CGM placed. Changes A1C  to evry 3 months schedule.   Teaching Method Utilized: Visual, Auditory,Hands on Handouts given during visit include: After visit summary , meal planning booklet, exercise bands and work out for them Barriers to learning/adherence to lifestyle change: suspect lack of material resources, transportation Demonstrated degree of understanding via:  Teach Back   Monitoring/Evaluation:  Dietary intake, exercise, meter, and body weight in 1 year(s) at a minimum [employer] , RD 03/25/2022 11:10 AM. .

## 2022-04-24 ENCOUNTER — Ambulatory Visit (INDEPENDENT_AMBULATORY_CARE_PROVIDER_SITE_OTHER): Payer: Medicare Other | Admitting: Internal Medicine

## 2022-04-24 ENCOUNTER — Ambulatory Visit (INDEPENDENT_AMBULATORY_CARE_PROVIDER_SITE_OTHER): Payer: Medicare Other

## 2022-04-24 ENCOUNTER — Encounter: Payer: Self-pay | Admitting: Internal Medicine

## 2022-04-24 VITALS — BP 164/98 | HR 78 | Temp 97.5°F | Ht 65.0 in | Wt 222.2 lb

## 2022-04-24 VITALS — BP 153/91 | HR 82 | Temp 97.5°F | Ht 65.0 in | Wt 222.2 lb

## 2022-04-24 DIAGNOSIS — I10 Essential (primary) hypertension: Secondary | ICD-10-CM

## 2022-04-24 DIAGNOSIS — E1169 Type 2 diabetes mellitus with other specified complication: Secondary | ICD-10-CM

## 2022-04-24 DIAGNOSIS — E785 Hyperlipidemia, unspecified: Secondary | ICD-10-CM

## 2022-04-24 DIAGNOSIS — Z0000 Encounter for general adult medical examination without abnormal findings: Secondary | ICD-10-CM

## 2022-04-24 DIAGNOSIS — Z6830 Body mass index (BMI) 30.0-30.9, adult: Secondary | ICD-10-CM

## 2022-04-24 DIAGNOSIS — E669 Obesity, unspecified: Secondary | ICD-10-CM | POA: Diagnosis not present

## 2022-04-24 DIAGNOSIS — E119 Type 2 diabetes mellitus without complications: Secondary | ICD-10-CM

## 2022-04-24 DIAGNOSIS — Z87891 Personal history of nicotine dependence: Secondary | ICD-10-CM

## 2022-04-24 LAB — GLUCOSE, CAPILLARY: Glucose-Capillary: 120 mg/dL — ABNORMAL HIGH (ref 70–99)

## 2022-04-24 LAB — POCT GLYCOSYLATED HEMOGLOBIN (HGB A1C): Hemoglobin A1C: 6.9 % — AB (ref 4.0–5.6)

## 2022-04-24 MED ORDER — LOSARTAN POTASSIUM 50 MG PO TABS: 50.0000 mg | ORAL_TABLET | Freq: Every day | ORAL | 2 refills | Status: DC

## 2022-04-24 NOTE — Assessment & Plan Note (Signed)
Patient's weight is down nearly 30 pounds since the beginning of this year. She has been trying to walk 15 minutes daily, some days more or less, most days of the week.  Plan:Patient encouraged on current weight loss. I have encouraged her to try to walk 30 minutes per day most days of the week as tolerated by patient and weather permitting as she walks outdoors.

## 2022-04-24 NOTE — Assessment & Plan Note (Signed)
HbA1c significantly improved at 6.9% from 9.1% in May 2023.  She checks her blood sugar once daily, typically after eating a small meal at breakfast, and in the time range of 07/10-07/10 - 08/09 the lowest recording was 108 and the high scoring is 146.  She denies any symptomatic hypoglycemia.  She has been increasing her exercise and walking duration, walking around 15 minutes/day most days of the week with some types walking more than others.  Her weight is down 4 pounds since June and she continues to try to lose weight to further help her diabetes management.  Current regimen is Synjardy 5-500 twice daily. Plan: Continue current regimen of Synjardy 5-500 twice daily.  I have asked patient to continue checking her blood sugars daily in the morning and recording these for [place] to review at her next visit.  I have asked her to check her blood sugar prior to eating or drinking in the morning so that we are better able to assess her fasting blood glucose.

## 2022-04-24 NOTE — Assessment & Plan Note (Addendum)
Initial blood pressure elevated at 153/91, recheck of 164/98.  Current regimen includes HCTZ 12.5 mg daily.  BMP was last checked in 10/2021 and was within normal limits.  Urine protein studies not due again until next office visit. Plan: Start losartan 50 mg daily. Continue HCTZ 12.5 mg today. Obtain urine protein studies and BMP at next office visit.

## 2022-04-24 NOTE — Progress Notes (Unsigned)
Subjective:   Colleen Williams is a 66 y.o. female who presents for an Initial Medicare Annual Wellness Visit. I connected with  Colleen Williams on 04/24/22 by a  Face-To-Face  enabled telemedicine application and verified that I am speaking with the correct person using two identifiers.  Patient Location: Other:  Office/Clinic  Provider Location: Office/Clinic  I discussed the limitations of evaluation and management by telemedicine. The patient expressed understanding and agreed to proceed.  Review of Systems    Defer to PCP       Objective:    Today's Vitals   04/24/22 0945  BP: (!) 153/91  Pulse: 82  Temp: (!) 97.5 F (36.4 C)  TempSrc: Oral  SpO2: 99%  Weight: 222 lb 3.2 oz (100.8 kg)  Height: '5\' 5"'  (1.651 m)   Body mass index is 36.98 kg/m.     04/24/2022    9:47 AM 04/24/2022    8:32 AM 01/21/2022    8:35 AM 10/23/2021    8:46 AM 08/20/2021    8:44 AM 07/18/2021    9:24 AM 03/14/2020    9:54 AM  Advanced Directives  Does Patient Have a Medical Advance Directive? No No No No No No No  Would patient like information on creating a medical advance directive? No - Patient declined No - Patient declined No - Patient declined No - Patient declined No - Patient declined No - Patient declined No - Patient declined    Current Medications (verified) Outpatient Encounter Medications as of 04/24/2022  Medication Sig   Empagliflozin-metFORMIN HCl (SYNJARDY) 5-500 MG TABS Take 1 tablet by mouth in the morning and at bedtime.   glucose monitoring kit (FREESTYLE) monitoring kit 1 each by Does not apply route as needed for other.   hydrochlorothiazide (HYDRODIURIL) 12.5 MG tablet Take 1 tablet (12.5 mg total) by mouth daily.   Lancets (FREESTYLE) lancets Use as instructed   rosuvastatin (CRESTOR) 20 MG tablet Take 1 tablet (20 mg total) by mouth daily.   No facility-administered encounter medications on file as of 04/24/2022.    Allergies (verified) Patient has no known allergies.    History: Past Medical History:  Diagnosis Date   Arthritis    Blurry vision, bilateral 01/29/2019   Lumbar radiculopathy, chronic 06/12/2015   Vitreous floaters of both eyes 08/03/2017   Past Surgical History:  Procedure Laterality Date   TUBAL LIGATION     Family History  Problem Relation Age of Onset   Diabetes Mother    Colon cancer Neg Hx    Colon polyps Neg Hx    Esophageal cancer Neg Hx    Stomach cancer Neg Hx    Rectal cancer Neg Hx    Breast cancer Neg Hx    Social History   Socioeconomic History   Marital status: Single    Spouse name: Not on file   Number of children: Not on file   Years of education: Not on file   Highest education level: Not on file  Occupational History   Not on file  Tobacco Use   Smoking status: Former    Types: Cigarettes    Quit date: 09/16/2008    Years since quitting: 13.6   Smokeless tobacco: Former  Scientific laboratory technician Use: Never used  Substance and Sexual Activity   Alcohol use: Yes    Alcohol/week: 0.0 standard drinks of alcohol    Comment: Occasionally wine.   Drug use: No   Sexual activity: Not Currently  Birth control/protection: Surgical  Other Topics Concern   Not on file  Social History Narrative   Not on file   Social Determinants of Health   Financial Resource Strain: Low Risk  (04/24/2022)   Overall Financial Resource Strain (CARDIA)    Difficulty of Paying Living Expenses: Not hard at all  Food Insecurity: No Food Insecurity (04/24/2022)   Hunger Vital Sign    Worried About Running Out of Food in the Last Year: Never true    Ran Out of Food in the Last Year: Never true  Transportation Needs: No Transportation Needs (04/24/2022)   PRAPARE - Hydrologist (Medical): No    Lack of Transportation (Non-Medical): No  Physical Activity: Unknown (04/24/2022)   Exercise Vital Sign    Days of Exercise per Week: 2 days    Minutes of Exercise per Session: Not on file  Stress: No Stress  Concern Present (04/24/2022)   [place]    Feeling of Stress : Not at all  Social Connections: Moderately Integrated (04/24/2022)   Social Connection and Isolation Panel [NHANES]    Frequency of Communication with Friends and Family: Twice a week    Frequency of Social Gatherings with Friends and Family: Twice a week    Attends Religious Services: More than 4 times per year    Active Member of [employer] or Organizations: Yes    Attends Music therapist: More than 4 times per year    Marital Status: Never married    Tobacco Counseling Counseling given: Not Answered   Clinical Intake:  Pre-visit preparation completed: Yes  Pain : No/denies pain     Nutritional Risks: None Diabetes: Yes  How often do you need to have someone help you when you read instructions, pamphlets, or other written materials from your doctor or pharmacy?: 1 - Never What is the last grade level you completed in school?: 11th grade  Diabetic?Yes  Interpreter Needed?: No  Information entered by :: Adolphe Fortunato,cma 04/24/22 9:46am   Activities of Daily Living    04/24/2022    9:47 AM 04/24/2022    8:32 AM  In your present state of health, do you have any difficulty performing the following activities:  Hearing? 0 0  Vision? 0 0  Difficulty concentrating or making decisions? 0 0  Walking or climbing stairs? 0 0  Dressing or bathing? 0 0  Doing errands, shopping? 0 0    Patient Care Team: Sanjuan Dame, MD as PCP - General  Indicate any recent Medical Services you may have received from other than [facility] providers in the past year (date may be approximate).     Assessment:   This is a routine wellness examination for Colleen Williams.  Hearing/Vision screen No results found.  Dietary issues and exercise activities discussed:     Goals Addressed   None   Depression Screen    04/24/2022    9:47 AM 04/24/2022    8:57 AM  04/24/2022    8:32 AM 01/21/2022    8:35 AM 10/23/2021   11:06 AM 08/20/2021    8:50 AM 07/18/2021    9:57 AM  PHQ 2/9 Scores  PHQ - 2 Score 0 0 0 0 0 0 1  PHQ- 9 Score     0 0 1    Fall Risk    04/24/2022    9:47 AM 04/24/2022    8:31 AM 02/25/2022    8:59  AM 01/21/2022    8:35 AM 10/23/2021    8:47 AM  Fall Risk   Falls in the past year? 0 0 0 0 0  Number falls in past yr: 0 0 0    Injury with Fall? 0 0 0    Risk for fall due to : No Fall Risks No Fall Risks No Fall Risks No Fall Risks   Follow up Falls evaluation completed;Falls prevention discussed Falls evaluation completed;Falls prevention discussed Falls evaluation completed Falls evaluation completed Falls evaluation completed    FALL RISK PREVENTION PERTAINING TO THE HOME:  Any stairs in or around the home? Yes  If so, are there any without handrails? No  Home free of loose throw rugs in walkways, pet beds, electrical cords, etc? Yes  Adequate lighting in your home to reduce risk of falls? Yes   ASSISTIVE DEVICES UTILIZED TO PREVENT FALLS:  Life alert? No  Use of a cane, walker or w/c? No  Grab bars in the bathroom? Yes  Shower chair or bench in shower? No  Elevated toilet seat or a handicapped toilet? No   TIMED UP AND GO:  Was the test performed? Yes .  Length of time to ambulate 10 feet: 1 minute sec.   Gait slow and steady without use of assistive device  Cognitive Function:        04/24/2022    9:00 AM  6CIT Screen  What Year? 0 points  What month? 0 points  Count back from 20 0 points  Months in reverse 0 points  Repeat phrase 0 points    Immunizations Immunization History  Administered Date(s) Administered   Moderna Sars-Covid-2 Vaccination 10/18/2019, 11/15/2019   Tdap 01/29/2018    TDAP status: Up to date  Flu Vaccine status: Due, Education has been provided regarding the importance of this vaccine. Advised may receive this vaccine at local pharmacy or Health Dept. Aware to provide a copy of the  vaccination record if obtained from local pharmacy or Health Dept. Verbalized acceptance and understanding.  Pneumococcal vaccine status: Up to date  Covid-19 vaccine status: Completed vaccines  Qualifies for Shingles Vaccine? No   Zostavax completed No   Shingrix Completed?: No.    Education has been provided regarding the importance of this vaccine. Patient has been advised to call insurance company to determine out of pocket expense if they have not yet received this vaccine. Advised may also receive vaccine at local pharmacy or Health Dept. Verbalized acceptance and understanding.  Screening Tests Health Maintenance  Topic Date Due   Zoster Vaccines- Shingrix (1 of 2) Never done   COVID-19 Vaccine (3 - Moderna series) 01/10/2020   PAP SMEAR-Modifier  01/31/2020   DEXA SCAN  Never done   INFLUENZA VACCINE  04/16/2022   Pneumonia Vaccine 74+ Years old (1 - PCV) 07/18/2022 (Originally 06/16/2021)   COLONOSCOPY (Pts 45-74yr Insurance coverage will need to be confirmed)  05/22/2022   FOOT EXAM  07/18/2022   URINE MICROALBUMIN  07/18/2022   HEMOGLOBIN A1C  07/25/2022   OPHTHALMOLOGY EXAM  11/02/2022   MAMMOGRAM  08/25/2023   TETANUS/TDAP  01/30/2028   Hepatitis C Screening  Completed   HIV Screening  Completed   HPV VACCINES  Aged Out    Health Maintenance  Health Maintenance Due  Topic Date Due   Zoster Vaccines- Shingrix (1 of 2) Never done   COVID-19 Vaccine (3 - Moderna series) 01/10/2020   PAP SMEAR-Modifier  01/31/2020   DEXA SCAN  Never  done   INFLUENZA VACCINE  04/16/2022    Colorectal cancer screening: Type of screening: Colonoscopy. Completed 05/22/2017. Repeat every 5 years  Mammogram status: Completed 08/24/2021. Repeat every year:2   Lung Cancer Screening: (Low Dose CT Chest recommended if Age 33-80 years, 30 pack-year currently smoking OR have quit w/in 15years.) does not qualify.   Lung Cancer Screening Referral: N/A  Additional Screening:  Hepatitis C  Screening: does not qualify; Completed 06/12/2015  Vision Screening: Recommended annual ophthalmology exams for early detection of glaucoma and other disorders of the eye. Is the patient up to date with their annual eye exam?  Yes  Who is the provider or what is the name of the office in which the patient attends annual eye exams? [facility] If pt is not established with a provider, would they like to be referred to a provider to establish care? No .   Dental Screening: Recommended annual dental exams for proper oral hygiene  Community Resource Referral / Chronic Care Management: CRR required this visit?  No   CCM required this visit?  No      Plan:     I have personally reviewed and noted the following in the patient's chart:   Medical and social history Use of alcohol, tobacco or illicit drugs  Current medications and supplements including opioid prescriptions. Patient is not currently taking opioid prescriptions. Functional ability and status Nutritional status Physical activity Advanced directives List of other physicians Hospitalizations, surgeries, and ER visits in previous 12 months Vitals Screenings to include cognitive, depression, and falls Referrals and appointments  In addition, I have reviewed and discussed with patient certain preventive protocols, quality metrics, and best practice recommendations. A written personalized care plan for preventive services as well as general preventive health recommendations were provided to patient.     Kerin Perna, [facility]   04/24/2022   Nurse Notes:Face-To-Face 10 minute visit  Colleen Williams , Thank you for taking time to come for your Medicare Wellness Visit. I appreciate your ongoing commitment to your health goals. Please review the following plan we discussed and let me know if I can assist you in the future.   These are the goals we discussed:  Goals   None     This is a list of the screening recommended for you and due  dates:  Health Maintenance  Topic Date Due   Zoster (Shingles) Vaccine (1 of 2) Never done   COVID-19 Vaccine (3 - Moderna series) 01/10/2020   Pap Smear  01/31/2020   DEXA scan (bone density measurement)  Never done   Flu Shot  04/16/2022   Pneumonia Vaccine (1 - PCV) 07/18/2022*   Colon Cancer Screening  05/22/2022   Complete foot exam   07/18/2022   Urine Protein Check  07/18/2022   Hemoglobin A1C  07/25/2022   Eye exam for diabetics  11/02/2022   Mammogram  08/25/2023   Tetanus Vaccine  01/30/2028   Hepatitis C Screening: USPSTF Recommendation to screen - Ages 18-79 yo.  Completed   HIV Screening  Completed   HPV Vaccine  Aged Out  *Topic was postponed. The date shown is not the original due date.

## 2022-04-24 NOTE — Assessment & Plan Note (Addendum)
Current regimen of rosuvastatin 20 mg daily.  Lipid panel last checked in November 2022 revealed LDL of 117, above goal of less than 100. Plan: Continue rosuvastatin 20 mg daily.  Lipid panel at next OV.

## 2022-04-24 NOTE — Progress Notes (Signed)
   CC: diabetes f/u  HPI:  [name] is a 66 y.o. female with past medical history as detailed below who presents today for follow-up of HbA1c. Please see problem based charting for detailed assessment and plan.  Past Medical History:  Diagnosis Date   Arthritis    Blurry vision, bilateral 01/29/2019   Lumbar radiculopathy, chronic 06/12/2015   Vitreous floaters of both eyes 08/03/2017   Review of Systems:  Negative unless otherwise stated.  Physical Exam:  Vitals:   04/24/22 0831  BP: (!) 153/91  Pulse: 82  Temp: (!) 97.5 F (36.4 C)  TempSrc: Oral  SpO2: 99%  Weight: 222 lb 3.2 oz (100.8 kg)  Height: '5\' 5"'$  (1.651 m)   Constitutional:Well-appearing female, no acute distress. Cardio:Regular rate and rhythm. No murmurs, rubs, or gallops appreciated. Pulm:Clear to auscultation bilaterally. OEV:[id number] for extremity edema. Skin:Warm and dry. Neuro:Alert and oriented x3. No focal deficit noted. Psych:Pleasant mood and affect.  Assessment & Plan:   See Encounters Tab for problem based charting.  Type 2 diabetes mellitus (HCC) HbA1c significantly improved at 6.9% from 9.1% in May 2023.  She checks her blood sugar once daily, typically after eating a small meal at breakfast, and in the time range of 07/10-07/10 - 08/09 the lowest recording was 108 and the high scoring is 146.  She denies any symptomatic hypoglycemia.  She has been increasing her exercise and walking duration, walking around 15 minutes/day most days of the week with some types walking more than others.  Her weight is down 4 pounds since June and she continues to try to lose weight to further help her diabetes management.  Current regimen is Synjardy 5-500 twice daily. Plan: Continue current regimen of Synjardy 5-500 twice daily.  I have asked patient to continue checking her blood sugars daily in the morning and recording these for [place] to review at her next visit.  I have asked her to check her blood sugar  prior to eating or drinking in the morning so that we are better able to assess her fasting blood glucose.  Essential hypertension Initial blood pressure elevated at 153/91, recheck of 164/98.  Current regimen includes HCTZ 12.5 mg daily.  BMP was last checked in 10/2021 and was within normal limits.  Urine protein studies not due again until next office visit. Plan: Start losartan 50 mg daily. Continue HCTZ 12.5 mg today. Obtain urine protein studies and BMP at next office visit.   Hyperlipidemia associated with type 2 diabetes mellitus (HCC) Current regimen of rosuvastatin 20 mg daily.  Lipid panel last checked in November 2022 revealed LDL of 117, above goal of less than 100. Plan: Continue rosuvastatin 20 mg daily.  Lipid panel at next OV.  Obesity (BMI 30-39.9) Patient's weight is down nearly 30 pounds since the beginning of this year. She has been trying to walk 15 minutes daily, some days more or less, most days of the week.  Plan:Patient encouraged on current weight loss. I have encouraged her to try to walk 30 minutes per day most days of the week as tolerated by patient and weather permitting as she walks outdoors.  Healthcare maintenance Patient is due for Pap smear. Please obtain at next OV.   Patient discussed with Dr. Evette Doffing

## 2022-04-24 NOTE — Assessment & Plan Note (Signed)
Patient is due for Pap smear. Please obtain at next OV.

## 2022-04-24 NOTE — Patient Instructions (Signed)
Colleen Williams,  It was a pleasure to meet you today. We discussed several topics:  Blood pressure: Your blood pressure was high today. I have sent a new medicine to help with this that will also help protect your kidneys. It is called losartan and you will take it once daily. Continue to take hydrochlorothiazide once daily as well. Diabetes: Your HbA1c has improved to 6.9%! No medication changes today. Continue keeping a log of your blood sugars each morning.  Please follow up in 3 months. At that time you will be due for a pap smear as well.  My best, Dr. Marlou Sa

## 2022-04-25 NOTE — Progress Notes (Signed)
I reviewed the AWV findings with the provider who conducted the visit. I was present in the office suite and immediately available to provide assistance and direction throughout the time the service was provided.   Farrel Gordon, DO

## 2022-04-25 NOTE — Progress Notes (Signed)
Internal Medicine Clinic Attending ? ?Case discussed with Dr. Dean  At the time of the visit.  We reviewed the resident?s history and exam and pertinent patient test results.  I agree with the assessment, diagnosis, and plan of care documented in the resident?s note.  ?

## 2022-05-06 NOTE — Progress Notes (Signed)
Internal Medicine Clinic Attending  Case and documentation of Dr. Dean  at the time of the visit reviewed.  I reviewed the AWV findings.  I agree with the assessment, diagnosis, and plan of care documented in the AWV note.     

## 2022-05-14 ENCOUNTER — Telehealth: Payer: Self-pay

## 2022-05-14 ENCOUNTER — Telehealth: Payer: Self-pay | Admitting: *Deleted

## 2022-05-14 DIAGNOSIS — E119 Type 2 diabetes mellitus without complications: Secondary | ICD-10-CM

## 2022-05-14 MED ORDER — GLUCOSE BLOOD VI STRP: ORAL_STRIP | 5 refills | Status: DC

## 2022-05-14 NOTE — Telephone Encounter (Signed)
Requesting refill on Accu-check test strips '@[facility]'$  [id number] [address] [place].

## 2022-05-16 ENCOUNTER — Ambulatory Visit: Payer: Medicare Other | Admitting: Dietician

## 2022-06-05 ENCOUNTER — Encounter: Payer: Self-pay | Admitting: Gastroenterology

## 2022-06-13 ENCOUNTER — Other Ambulatory Visit: Payer: Self-pay | Admitting: Internal Medicine

## 2022-06-13 DIAGNOSIS — I10 Essential (primary) hypertension: Secondary | ICD-10-CM

## 2022-06-25 ENCOUNTER — Ambulatory Visit (AMBULATORY_SURGERY_CENTER): Payer: Self-pay

## 2022-06-25 VITALS — Ht 65.0 in | Wt 219.0 lb

## 2022-06-25 DIAGNOSIS — Z86010 Personal history of colonic polyps: Secondary | ICD-10-CM

## 2022-06-25 MED ORDER — NA SULFATE-K SULFATE-MG SULF 17.5-3.13-1.6 GM/177ML PO SOLN: 1.0000 | ORAL | 0 refills | Status: DC

## 2022-06-25 NOTE — Progress Notes (Signed)
No egg or soy allergy known to patient  No issues known to pt with past sedation with any surgeries or procedures Patient denies ever being told they had issues or difficulty with intubation  No FH of Malignant Hyperthermia Pt is not on diet pills Pt is not on  home 02  Pt is not on blood thinners  Pt denies issues with constipation  No A fib or A flutter Have any cardiac testing pending--denied Pt instructed to use [email or web address] or GoodRx for a price reduction on prep   PV via phone. Suprep instructions (with Synjardy hold instructions)  mailed to verified address with sample consent.

## 2022-07-01 ENCOUNTER — Encounter: Payer: Self-pay | Admitting: Gastroenterology

## 2022-07-01 ENCOUNTER — Ambulatory Visit (AMBULATORY_SURGERY_CENTER): Payer: Medicare Other | Admitting: Gastroenterology

## 2022-07-01 VITALS — BP 119/75 | HR 81 | Temp 96.9°F | Resp 14 | Ht 65.0 in | Wt 219.0 lb

## 2022-07-01 DIAGNOSIS — Z09 Encounter for follow-up examination after completed treatment for conditions other than malignant neoplasm: Secondary | ICD-10-CM | POA: Diagnosis not present

## 2022-07-01 DIAGNOSIS — K621 Rectal polyp: Secondary | ICD-10-CM | POA: Diagnosis not present

## 2022-07-01 DIAGNOSIS — D128 Benign neoplasm of rectum: Secondary | ICD-10-CM

## 2022-07-01 DIAGNOSIS — Z86010 Personal history of colonic polyps: Secondary | ICD-10-CM | POA: Diagnosis not present

## 2022-07-01 MED ORDER — SODIUM CHLORIDE 0.9 % IV SOLN: 500.0000 mL | Freq: Once | INTRAVENOUS | Status: DC

## 2022-07-01 NOTE — Progress Notes (Signed)
To pacu, VSS. Report to [email or web address] 

## 2022-07-01 NOTE — Progress Notes (Signed)
VS completed by [place].   Pt's states no medical or surgical changes since previsit or office visit.  

## 2022-07-01 NOTE — Progress Notes (Signed)
Called to room to assist during endoscopic procedure.  Patient ID and intended procedure confirmed with present staff. Received instructions for my participation in the procedure from the performing physician.  

## 2022-07-01 NOTE — Progress Notes (Signed)
History and Physical:  This patient presents for endoscopic testing for: Encounter Diagnosis  Name Primary?   Personal history of colonic polyps Yes    66 year old woman here today for surveillance colonoscopy.  In September 2018 (first screening colonoscopy), a diminutive hepatic flexure polyp was normal colon tissue, and a 6 mm rectal tubular adenoma was removed. Patient denies chronic abdominal pain, rectal bleeding, constipation or diarrhea.  Patient is otherwise without complaints or active issues today.   Past Medical History: Past Medical History:  Diagnosis Date   Arthritis    Blurry vision, bilateral 01/29/2019   Lumbar radiculopathy, chronic 06/12/2015   Vitreous floaters of both eyes 08/03/2017     Past Surgical History: Past Surgical History:  Procedure Laterality Date   COLONOSCOPY     TUBAL LIGATION      Allergies: No Known Allergies  Outpatient Meds: Current Outpatient Medications  Medication Sig Dispense Refill   Blood Glucose Monitoring Suppl (ACCU-CHEK GUIDE) w/Device KIT See admin instructions.     Empagliflozin-metFORMIN HCl (SYNJARDY) 5-500 MG TABS Take by mouth.     glucose blood test strip Use four times daily with insulin as directed. 200 each 5   glucose monitoring kit (FREESTYLE) monitoring kit 1 each by Does not apply route as needed for other. 1 each 1   hydrochlorothiazide (HYDRODIURIL) 12.5 MG tablet Take 1 tablet by mouth once daily 90 tablet 3   Lancets (FREESTYLE) lancets Use as instructed 100 each 12   losartan (COZAAR) 50 MG tablet Take 1 tablet (50 mg total) by mouth daily. 30 tablet 2   rosuvastatin (CRESTOR) 20 MG tablet Take 1 tablet (20 mg total) by mouth daily. 90 tablet 3   acetaminophen-codeine (TYLENOL #3) 300-30 MG tablet Take 1 tablet by mouth every 6 (six) hours.     amoxicillin (AMOXIL) 500 MG capsule Take 500 mg by mouth every 8 (eight) hours.     Current Facility-Administered Medications  Medication Dose Route Frequency  Provider Last Rate Last Admin   0.9 %  sodium chloride infusion  500 mL Intravenous Once Danis, Estill Cotta III, MD          ___________________________________________________________________ Objective   Exam:  BP 134/71   Pulse 83   Temp (!) 96.9 F (36.1 C) (Temporal)   Ht _0  (1.651 m)   Wt 219 lb (99.3 kg)   SpO2 96%   BMI 36.44 kg/m   CV: regular , S1/S2 Resp: clear to auscultation bilaterally, normal RR and effort noted GI: soft, no tenderness, with active bowel sounds.   Assessment: Encounter Diagnosis  Name Primary?   Personal history of colonic polyps Yes     Plan: Colonoscopy  The benefits and risks of the planned procedure were described in detail with the patient or (when appropriate) their health care proxy.  Risks were outlined as including, but not limited to, bleeding, infection, perforation, adverse medication reaction leading to cardiac or pulmonary decompensation, pancreatitis (if ERCP).  The limitation of incomplete mucosal visualization was also discussed.  No guarantees or warranties were given.    The patient is appropriate for an endoscopic procedure in the ambulatory setting.   - Wilfrid Lund, MD

## 2022-07-01 NOTE — Op Note (Signed)
[facility] Patient Name: Colleen Williams Procedure Date: 07/01/2022 8:55 AM MRN: [id number] Endoscopist: Mallie Mussel L. Loletha Carrow , MD Age: 66 Referring MD:  Date of Birth: 1956/04/05 Gender: Female Account #: [id number] Procedure:                Colonoscopy Indications:              Surveillance: Personal history of adenomatous                            polyps on last colonoscopy 5 years ago                           6m rectal TA on 1st colonoscopy Sept 2018 Medicines:                Monitored Anesthesia Care Procedure:                Pre-Anesthesia Assessment:                           - Prior to the procedure, a History and Physical                            was performed, and patient medications and                            allergies were reviewed. The patient's tolerance of                            previous anesthesia was also reviewed. The risks                            and benefits of the procedure and the sedation                            options and risks were discussed with the patient.                            All questions were answered, and informed consent                            was obtained. Prior Anticoagulants: The patient has                            taken no previous anticoagulant or antiplatelet                            agents. ASA Grade Assessment: II - A patient with                            mild systemic disease. After reviewing the risks                            and benefits, the patient was deemed in  satisfactory condition to undergo the procedure.                           After obtaining informed consent, the colonoscope                            was passed under direct vision. Throughout the                            procedure, the patient's blood pressure, pulse, and                            oxygen saturations were monitored continuously. The                            Colonoscope was introduced  through the anus and                            advanced to the the cecum, identified by                            appendiceal orifice and ileocecal valve. The                            colonoscopy was performed without difficulty. The                            patient tolerated the procedure well. The quality                            of the bowel preparation was good in most areas but                            fair in others despite lavage (fibrous food debris                            - see photo). The ileocecal valve, appendiceal                            orifice, and rectum were photographed. The bowel                            preparation used was SUPREP. Scope In: 9:02:40 AM Scope Out: 9:14:11 AM Scope Withdrawal Time: 0 hours 10 minutes 10 seconds  Total Procedure Duration: 0 hours 11 minutes 31 seconds  Findings:                 The perianal and digital rectal examinations were                            normal.                           Repeat examination of right colon under NBI  performed.                           Multiple diverticula were found in the left colon                            and right colon.                           A diminutive polyp was found in the distal rectum.                            The polyp was sessile. The polyp was removed with a                            cold biopsy forceps. Resection and retrieval were                            complete.                           The exam was otherwise without abnormality on                            direct and retroflexion views. Complications:            No immediate complications. Estimated Blood Loss:     Estimated blood loss was minimal. Impression:               - Diverticulosis in the left colon and in the right                            colon.                           - One diminutive polyp in the distal rectum,                            removed with a cold  biopsy forceps. Resected and                            retrieved.                           - The examination was otherwise normal on direct                            and retroflexion views. Recommendation:           - Patient has a contact number available for                            emergencies. The signs and symptoms of potential                            delayed complications were discussed with the  patient. Return to normal activities tomorrow.                            Written discharge instructions were provided to the                            patient.                           - Resume previous diet.                           - Continue present medications.                           - Await pathology results.                           - Repeat colonoscopy in 5 years for surveillance,                            regardless of polyp pathology (due to prep noted                            above). For next exam, closer attention to                            pre-procedure dietary restrictions re: fibrous                            foods and use split-dose golytely prep. Coutney Wildermuth L. Loletha Carrow, MD 07/01/2022 9:22:00 AM This report has been signed electronically.

## 2022-07-01 NOTE — Patient Instructions (Signed)
1 polyp removed- await pathology  Please read over handouts about polyps and diverticulosis Next colonoscopy in 5 years Please continue your normal medications  YOU HAD AN ENDOSCOPIC PROCEDURE TODAY AT [facility]:   Refer to the procedure report that was given to you for any specific questions about what was found during the examination.  If the procedure report does not answer your questions, please call your gastroenterologist to clarify.  If you requested that your care partner not be given the details of your procedure findings, then the procedure report has been included in a sealed envelope for you to review at your convenience later.  YOU SHOULD EXPECT: Some feelings of bloating in the abdomen. Passage of more gas than usual.  Walking can help get rid of the air that was put into your GI tract during the procedure and reduce the bloating. If you had a lower endoscopy (such as a colonoscopy or flexible sigmoidoscopy) you may notice spotting of blood in your stool or on the toilet paper. If you underwent a bowel prep for your procedure, you may not have a normal bowel movement for a few days.  Please Note:  You might notice some irritation and congestion in your nose or some drainage.  This is from the oxygen used during your procedure.  There is no need for concern and it should clear up in a day or so.  SYMPTOMS TO REPORT IMMEDIATELY:  Following lower endoscopy (colonoscopy or flexible sigmoidoscopy):  Excessive amounts of blood in the stool  Significant tenderness or worsening of abdominal pains  Swelling of the abdomen that is new, acute  Fever of 100F or higher For urgent or emergent issues, a gastroenterologist can be reached at any hour by calling [phone]. Do not use MyChart messaging for urgent concerns.    DIET:  We do recommend a small meal at first, but then you may proceed to your regular diet.  Drink plenty of fluids but you should avoid alcoholic  beverages for 24 hours.  ACTIVITY:  You should plan to take it easy for the rest of today and you should NOT DRIVE or use heavy machinery until tomorrow (because of the sedation medicines used during the test).    FOLLOW UP: Our staff will call the number listed on your records the next business day following your procedure.  We will call around 7:15- 8:00 am to check on you and address any questions or concerns that you may have regarding the information given to you following your procedure. If we do not reach you, we will leave a message.     If any biopsies were taken you will be contacted by phone or by letter within the next 1-3 weeks.  Please call us at [phone] if you have not heard about the biopsies in 3 weeks.    SIGNATURES/CONFIDENTIALITY: You and/or your care partner have signed paperwork which will be entered into your electronic medical record.  These signatures attest to the fact that that the information above on your After Visit Summary has been reviewed and is understood.  Full responsibility of the confidentiality of this discharge information lies with you and/or your care-partner.

## 2022-07-02 ENCOUNTER — Telehealth: Payer: Self-pay | Admitting: *Deleted

## 2022-07-02 NOTE — Telephone Encounter (Signed)
  Follow up Call-     07/01/2022    8:34 AM  Call back number  Post procedure Call Back phone  # [phone]  Permission to leave phone message Yes     Patient questions:  Do you have a fever, pain , or abdominal swelling? No. Pain Score  0 *  Have you tolerated food without any problems? Yes.    Have you been able to return to your normal activities? Yes.    Do you have any questions about your discharge instructions: Diet   No. Medications  No. Follow up visit  No.  Do you have questions or concerns about your Care? No.  Actions: * If pain score is 4 or above: No action needed, pain <4.

## 2022-07-03 ENCOUNTER — Other Ambulatory Visit: Payer: Self-pay | Admitting: Student

## 2022-07-03 MED ORDER — SYNJARDY 5-500 MG PO TABS: 1.0000 | ORAL_TABLET | Freq: Two times a day (BID) | ORAL | 0 refills | Status: DC

## 2022-07-11 ENCOUNTER — Encounter: Payer: Self-pay | Admitting: Gastroenterology

## 2022-07-24 ENCOUNTER — Ambulatory Visit (INDEPENDENT_AMBULATORY_CARE_PROVIDER_SITE_OTHER): Payer: Medicare Other | Admitting: Student

## 2022-07-24 ENCOUNTER — Encounter: Payer: Self-pay | Admitting: Student

## 2022-07-24 ENCOUNTER — Telehealth: Payer: Self-pay

## 2022-07-24 VITALS — BP 130/81 | HR 92 | Ht 65.0 in | Wt 223.0 lb

## 2022-07-24 DIAGNOSIS — E785 Hyperlipidemia, unspecified: Secondary | ICD-10-CM

## 2022-07-24 DIAGNOSIS — E1169 Type 2 diabetes mellitus with other specified complication: Secondary | ICD-10-CM

## 2022-07-24 DIAGNOSIS — E669 Obesity, unspecified: Secondary | ICD-10-CM | POA: Diagnosis not present

## 2022-07-24 DIAGNOSIS — I10 Essential (primary) hypertension: Secondary | ICD-10-CM

## 2022-07-24 DIAGNOSIS — E119 Type 2 diabetes mellitus without complications: Secondary | ICD-10-CM

## 2022-07-24 DIAGNOSIS — Z6830 Body mass index (BMI) 30.0-30.9, adult: Secondary | ICD-10-CM

## 2022-07-24 DIAGNOSIS — Z0000 Encounter for general adult medical examination without abnormal findings: Secondary | ICD-10-CM

## 2022-07-24 DIAGNOSIS — Z87891 Personal history of nicotine dependence: Secondary | ICD-10-CM

## 2022-07-24 DIAGNOSIS — Z7984 Long term (current) use of oral hypoglycemic drugs: Secondary | ICD-10-CM | POA: Diagnosis not present

## 2022-07-24 LAB — GLUCOSE, CAPILLARY: Glucose-Capillary: 116 mg/dL — ABNORMAL HIGH (ref 70–99)

## 2022-07-24 LAB — POCT GLYCOSYLATED HEMOGLOBIN (HGB A1C): Hemoglobin A1C: 7.3 % — AB (ref 4.0–5.6)

## 2022-07-24 NOTE — Patient Outreach (Signed)
  Care Coordination   In Person Provider Office Visit Note   07/24/2022 Name: UNITA DETAMORE MRN: [id number] DOB: November 07, 1955  Leonel Ramsay is a 66 y.o. year old female who sees Sanjuan Dame, MD for primary care. I spoke with  Leonel Ramsay by phone today.  What matters to the patients health and wellness today?  "I am doing well, do not need any care coordination assistance.  I have a Charity fundraiser who comes to see me".    Goals Addressed               This Visit's Progress     COMPLETED: "I am doing very well, my A1C was 5.5 yesterday" (pt-stated)        Care Coordination Interventions: Discussed plans with patient for ongoing care management follow up and provided patient with direct contact information for care management team Assessed social determinant of health barriers          SDOH assessments and interventions completed:  Yes  SDOH Interventions Today    Flowsheet Row Most Recent Value  SDOH Interventions   Utilities Interventions Intervention Not Indicated  Physical Activity Interventions Intervention Not Indicated        Care Coordination Interventions Activated:  Yes  Care Coordination Interventions:  Yes, provided   Follow up plan: No further intervention required.   Encounter Outcome:  Pt. Visit Completed

## 2022-07-24 NOTE — Patient Instructions (Signed)
Colleen Williams, it was a pleasure seeing you today!  Today we discussed: - Diabetes: Congrats on your sugars - they look great! Continue with your current medication.  - Continue with your current blood pressure and cholesterol medication.    I have ordered the following labs today:  Lab Orders         Microalbumin / Creatinine Urine Ratio         Glucose, capillary         BMP8+Anion Gap         POC Hbg A1C      Follow-up: 3 months   Please make sure to arrive 15 minutes prior to your next appointment. If you arrive late, you may be asked to reschedule.   We look forward to seeing you next time. Please call our clinic at [phone] if you have any questions or concerns. The best time to call is Monday-Friday from 9am-4pm, but there is someone available 24/7. If after hours or the weekend, call the main hospital number and ask for the Internal Medicine Resident On-Call. If you need medication refills, please notify your pharmacy one week in advance and they will send [place] a request.  Thank you for letting us take part in your care. Wishing you the best!  Thank you, Sanjuan Dame, MD

## 2022-07-25 LAB — BMP8+ANION GAP
Anion Gap: 15 mmol/L (ref 10.0–18.0)
BUN/Creatinine Ratio: 24 (ref 12–28)
BUN: 14 mg/dL (ref 8–27)
CO2: 25 mmol/L (ref 20–29)
Calcium: 9.9 mg/dL (ref 8.7–10.3)
Chloride: 103 mmol/L (ref 96–106)
Creatinine, Ser: 0.59 mg/dL (ref 0.57–1.00)
Glucose: 104 mg/dL — ABNORMAL HIGH (ref 70–99)
Potassium: 4.9 mmol/L (ref 3.5–5.2)
Sodium: 143 mmol/L (ref 134–144)
eGFR: 99 mL/min/{1.73_m2} (ref >59)

## 2022-07-25 LAB — MICROALBUMIN / CREATININE URINE RATIO
Creatinine, Urine: 42.9 mg/dL
Microalb/Creat Ratio: <7 mg/g creat (ref 0–29)
Microalbumin, Urine: <3 ug/mL

## 2022-07-26 MED ORDER — HYDROCHLOROTHIAZIDE 12.5 MG PO TABS: 12.5000 mg | ORAL_TABLET | Freq: Every day | ORAL | 3 refills | Status: DC

## 2022-07-26 MED ORDER — LOSARTAN POTASSIUM 50 MG PO TABS: 50.0000 mg | ORAL_TABLET | Freq: Every day | ORAL | 3 refills | Status: DC

## 2022-07-28 ENCOUNTER — Encounter: Payer: Self-pay | Admitting: Student

## 2022-07-28 NOTE — Assessment & Plan Note (Signed)
-   Per patient preference would like to defer DEXA scan and PVC20 vaccination at this time

## 2022-07-28 NOTE — Assessment & Plan Note (Signed)
Patient has lost roughly 30lb this year. She continues to work on her diet and exercise, including walking on most days of the week. Encouraged continued daily exercise. Could consider Zepbound in future.

## 2022-07-28 NOTE — Assessment & Plan Note (Signed)
BP Readings from Last 3 Encounters:  07/24/22 130/81  07/01/22 119/75  04/24/22 (!) 153/91   Patient at goal BP today, compliant with medications. Denies any side effects, including lightheadedness or dizziness. Will plan to continue with current regimen, obtain lab work today.  - Losartan '50mg'$  daily - Hydrochlorothiazide 12.'5mg'$  daily - BMP today

## 2022-07-28 NOTE — Assessment & Plan Note (Signed)
A1c today 7.3% from 6.9% at her last visit in August. Reports that her morning sugars have been at goal, usually in the low 100's and never below 70. Denies any hypoglycemic symptoms, polyuria, polydipsia, vision changes. Weight is stable since last visit. We will plan to continue with current regimen and have her follow-up in three months.  - Empagliflozin-metformin 5-'1000mg'$  twice daily - Rosuvastatin '20mg'$  daily - Foot exam completed today - Urine microalbumin/creatinine ratio today - Due for next ophthalmology exam 10/2022

## 2022-07-28 NOTE — Assessment & Plan Note (Signed)
Chronic, stable issue. Last LDL one year ago 117. She was off of this medication for awhile this year, we will hold off checking a lipid panel until next visit.  - Continue rosuvastatin '20mg'$  daily - Lipid panel at next visit

## 2022-07-28 NOTE — Progress Notes (Signed)
   CC: follow-up  HPI:  [name] is a 66 y.o. person with medical history as below presenting to [facility] for follow-up  Please see problem-based list for further details, assessments, and plans.  Past Medical History:  Diagnosis Date   Arthritis    Diabetes mellitus without complication ([place])    Hyperlipidemia    Hypertension    Vitreous floaters of both eyes 08/03/2017   Review of Systems:  As per HPI  Physical Exam:  Vitals:   07/24/22 0814  BP: 130/81  Pulse: 92  SpO2: 97%  Weight: 223 lb (101.2 kg)  Height: '5\' 5"'$  (1.651 m)   General: Resting comfortably in no acute distress CV: Regular rate, rhythm. No murmurs appreciated. Distal pulses 2+ bilaterally Pulm: Normal work of breathing on room air. Clear to auscultation bilaterally. MSK: Normal bulk, tone. No peripheral edema noted. Skin: Warm, dry. No rashes or lesions appreciated.  Neuro: Awake, alert, conversing appropriately. Grossly non-focal. Psych: Normal mood, affect, speech.   Assessment & Plan:   Essential hypertension BP Readings from Last 3 Encounters:  07/24/22 130/81  07/01/22 119/75  04/24/22 (!) 153/91   Patient at goal BP today, compliant with medications. Denies any side effects, including lightheadedness or dizziness. Will plan to continue with current regimen, obtain lab work today.  - Losartan '50mg'$  daily - Hydrochlorothiazide 12.'5mg'$  daily - BMP today  Type 2 diabetes mellitus (HCC) A1c today 7.3% from 6.9% at her last visit in August. Reports that her morning sugars have been at goal, usually in the low 100's and never below 70. Denies any hypoglycemic symptoms, polyuria, polydipsia, vision changes. Weight is stable since last visit. We will plan to continue with current regimen and have her follow-up in three months.  - Empagliflozin-metformin 5-'1000mg'$  twice daily - Rosuvastatin '20mg'$  daily - Foot exam completed today - Urine microalbumin/creatinine ratio today - Due for next  ophthalmology exam 10/2022  Hyperlipidemia associated with type 2 diabetes mellitus (HCC) Chronic, stable issue. Last LDL one year ago 117. She was off of this medication for awhile this year, we will hold off checking a lipid panel until next visit.  - Continue rosuvastatin '20mg'$  daily - Lipid panel at next visit  Healthcare maintenance - Per patient preference would like to defer DEXA scan and PVC20 vaccination at this time  Obesity (BMI 30-39.9) Patient has lost roughly 30lb this year. She continues to work on her diet and exercise, including walking on most days of the week. Encouraged continued daily exercise. Could consider Zepbound in future.   Patient discussed with Dr. Jacquenette Shone, MD Internal Medicine PGY-3 Pager: [phone]

## 2022-07-29 ENCOUNTER — Encounter: Payer: Medicare Other | Admitting: Gastroenterology

## 2022-07-29 NOTE — Progress Notes (Signed)
Internal Medicine Clinic Attending ? ?Case discussed with Dr. Braswell  At the time of the visit.  We reviewed the resident?s history and exam and pertinent patient test results.  I agree with the assessment, diagnosis, and plan of care documented in the resident?s note.  ?

## 2022-08-06 ENCOUNTER — Telehealth: Payer: Self-pay

## 2022-08-06 NOTE — Telephone Encounter (Signed)
  hydrochlorothiazide (HYDRODIURIL) 12.5 MG tablet,  losartan (COZAAR) 50 MG tablet, refill request @ [facility] ([facility]), [place] [facility] [place].

## 2022-08-06 NOTE — Telephone Encounter (Signed)
Both meds were sent to [facility] on 11/10 for #90 with 3 refills. 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

## 2022-10-06 ENCOUNTER — Encounter (HOSPITAL_COMMUNITY): Payer: Self-pay | Admitting: Pharmacy Technician

## 2022-10-06 ENCOUNTER — Emergency Department (HOSPITAL_COMMUNITY)
Admission: EM | Admit: 2022-10-06 | Discharge: 2022-10-06 | Disposition: A | Discharge status: Home / Self Care | Payer: 59 | Attending: Emergency Medicine | Admitting: Emergency Medicine

## 2022-10-06 DIAGNOSIS — S39012A Strain of muscle, fascia and tendon of lower back, initial encounter: Secondary | ICD-10-CM

## 2022-10-06 DIAGNOSIS — X58XXXA Exposure to other specified factors, initial encounter: Secondary | ICD-10-CM | POA: Diagnosis not present

## 2022-10-06 DIAGNOSIS — M545 Low back pain, unspecified: Secondary | ICD-10-CM | POA: Diagnosis present

## 2022-10-06 LAB — CBC WITH DIFFERENTIAL/PLATELET
Abs Immature Granulocytes: 0.02 10*3/uL (ref 0.00–0.07)
Basophils Absolute: 0.1 10*3/uL (ref 0.0–0.1)
Basophils Relative: 1 %
Eosinophils Absolute: 0.2 10*3/uL (ref 0.0–0.5)
Eosinophils Relative: 2 %
HCT: 47.6 % — ABNORMAL HIGH (ref 36.0–46.0)
Hemoglobin: 15.2 g/dL — ABNORMAL HIGH (ref 12.0–15.0)
Immature Granulocytes: 0 %
Lymphocytes Relative: 44 %
Lymphs Abs: 4 10*3/uL (ref 0.7–4.0)
MCH: 27.4 pg (ref 26.0–34.0)
MCHC: 31.9 g/dL (ref 30.0–36.0)
MCV: 85.9 fL (ref 80.0–100.0)
Monocytes Absolute: 0.7 10*3/uL (ref 0.1–1.0)
Monocytes Relative: 7 %
Neutro Abs: 4.3 10*3/uL (ref 1.7–7.7)
Neutrophils Relative %: 46 %
Platelets: 266 10*3/uL (ref 150–400)
RBC: 5.54 MIL/uL — ABNORMAL HIGH (ref 3.87–5.11)
RDW: 13.4 % (ref 11.5–15.5)
WBC: 9.2 10*3/uL (ref 4.0–10.5)
nRBC: 0 % (ref 0.0–0.2)

## 2022-10-06 LAB — URINALYSIS, MICROSCOPIC (REFLEX)

## 2022-10-06 LAB — BASIC METABOLIC PANEL
Anion gap: 9 (ref 5–15)
BUN: 16 mg/dL (ref 8–23)
CO2: 27 mmol/L (ref 22–32)
Calcium: 9.6 mg/dL (ref 8.9–10.3)
Chloride: 101 mmol/L (ref 98–111)
Creatinine, Ser: 0.78 mg/dL (ref 0.44–1.00)
GFR, Estimated: >60 mL/min (ref >60)
Glucose, Bld: 175 mg/dL — ABNORMAL HIGH (ref 70–99)
Potassium: 3.4 mmol/L — ABNORMAL LOW (ref 3.5–5.1)
Sodium: 137 mmol/L (ref 135–145)

## 2022-10-06 LAB — URINALYSIS, ROUTINE W REFLEX MICROSCOPIC
Bilirubin Urine: NEGATIVE
Glucose, UA: >=500 mg/dL — AB
Hgb urine dipstick: NEGATIVE
Ketones, ur: NEGATIVE mg/dL
Leukocytes,Ua: NEGATIVE
Nitrite: NEGATIVE
Protein, ur: NEGATIVE mg/dL
Specific Gravity, Urine: 1.02 (ref 1.005–1.030)
pH: 5 (ref 5.0–8.0)

## 2022-10-06 MED ORDER — LIDOCAINE 5 % EX PTCH
1.0000 | MEDICATED_PATCH | CUTANEOUS | Status: DC
  Administered 2022-10-06: 1 via TRANSDERMAL
  Filled 2022-10-06: qty 1

## 2022-10-06 MED ORDER — POTASSIUM CHLORIDE CRYS ER 20 MEQ PO TBCR
20.0000 meq | EXTENDED_RELEASE_TABLET | Freq: Once | ORAL | Status: AC
  Administered 2022-10-06: 20 meq via ORAL
  Filled 2022-10-06: qty 1

## 2022-10-06 MED ORDER — LIDOCAINE 5 % EX PTCH: 1.0000 | MEDICATED_PATCH | CUTANEOUS | 0 refills | Status: DC

## 2022-10-06 NOTE — Discharge Instructions (Addendum)
Your laboratory evaluation was significant for mild low potassium which was replenished orally.  Your back pain symptoms are consistent with lower lumbar strain.  The remaining labs were reassuring.  Turn to the emergency department for acute fall with low back pain, numbness or weakness in your legs and low back pain, fever and low back pain, numbness around your groin, urinary or fecal incontinence.  Recommend you take 600 mg of ibuprofen every 8 hours for pain control and apply lidocaine patch to the affected area.

## 2022-10-06 NOTE — ED Provider Notes (Signed)
[facility] Provider Note   CSN: [id number] Arrival date & time: 10/06/22  1802     History  Chief Complaint  Patient presents with   Back Pain    Colleen Williams is a 67 y.o. female.   Back Pain    67 year old female presenting to the emergency department with right-sided low back pain.  The patient states that 2 days ago she woke up with pain in her right back.  It was worsened with movement.  She denies any abdominal pain, denies any nausea or vomiting.  Denies any fevers or chills, burning with urination or increased urinary frequency.  She took Motrin here in the ED and her pain has completely resolved.  She denies any red flag symptoms of low back pain, denies any falls or trauma, denies any fevers or chills, denies any urinary or fecal incontinence, denies any saddle anesthesia.  Home Medications Prior to Admission medications   Medication Sig Start Date End Date Taking? Authorizing Provider  lidocaine (LIDODERM) 5 % Place 1 patch onto the skin daily. Remove & Discard patch within 12 hours or as directed by MD 10/06/22  Yes Regan Lemming, MD  acetaminophen-codeine (TYLENOL #3) 300-30 MG tablet Take 1 tablet by mouth every 6 (six) hours. 04/29/22   [provider]  Blood Glucose Monitoring Suppl (ACCU-CHEK GUIDE) w/Device KIT See admin instructions. 01/21/22   [provider]  Empagliflozin-metFORMIN HCl (SYNJARDY) 5-500 MG TABS Take 1 tablet by mouth in the morning and at bedtime. 07/03/22   Farrel Gordon, DO  glucose blood test strip Use four times daily with insulin as directed. 05/14/22   Sanjuan Dame, MD  glucose monitoring kit (FREESTYLE) monitoring kit 1 each by Does not apply route as needed for other. 01/21/22   Wayland Denis, MD  hydrochlorothiazide (HYDRODIURIL) 12.5 MG tablet Take 1 tablet (12.5 mg total) by mouth daily. 07/26/22   Sanjuan Dame, MD  Lancets (FREESTYLE) lancets Use as instructed 01/21/22    Wayland Denis, MD  losartan (COZAAR) 50 MG tablet Take 1 tablet (50 mg total) by mouth daily. 07/26/22   Sanjuan Dame, MD  rosuvastatin (CRESTOR) 20 MG tablet Take 1 tablet (20 mg total) by mouth daily. 02/25/22 02/20/23  Lacinda Axon, MD      Allergies    Patient has no known allergies.    Review of Systems   Review of Systems  Musculoskeletal:  Positive for back pain.  All other systems reviewed and are negative.   Physical Exam Updated Vital Signs BP (!) 140/82   Pulse (!) 102   Temp 98.2 F (36.8 C)   Resp 19   SpO2 94%  Physical Exam Vitals and nursing note reviewed.  Constitutional:      General: She is not in acute distress.    Appearance: She is well-developed.  HENT:     Head: Normocephalic and atraumatic.  Eyes:     Conjunctiva/sclera: Conjunctivae normal.  Cardiovascular:     Rate and Rhythm: Normal rate and regular rhythm.  Pulmonary:     Effort: Pulmonary effort is normal. No respiratory distress.     Breath sounds: Normal breath sounds.  Abdominal:     Palpations: Abdomen is soft.     Tenderness: There is no abdominal tenderness. There is no guarding or rebound.  Musculoskeletal:        General: No swelling.     Cervical back: Neck supple.     Comments: Tenderness of  the musculature of the right lower back, no midline tenderness of the spine  Skin:    General: Skin is warm and dry.     Capillary Refill: Capillary refill takes less than 2 seconds.  Neurological:     Mental Status: She is alert.     Comments: 5-5 strength in all 4 extremities and intact sensation to light touch  Psychiatric:        Mood and Affect: Mood normal.     ED Results / Procedures / Treatments   Labs (all labs ordered are listed, but only abnormal results are displayed) Labs Reviewed  BASIC METABOLIC PANEL - Abnormal; Notable for the following components:      Result Value   Potassium 3.4 (*)    Glucose, Bld 175 (*)    All other components within normal limits   CBC WITH DIFFERENTIAL/PLATELET - Abnormal; Notable for the following components:   RBC 5.54 (*)    Hemoglobin 15.2 (*)    HCT 47.6 (*)    All other components within normal limits  URINALYSIS, ROUTINE W REFLEX MICROSCOPIC - Abnormal; Notable for the following components:   Glucose, UA >=500 (*)    All other components within normal limits  URINALYSIS, MICROSCOPIC (REFLEX) - Abnormal; Notable for the following components:   Bacteria, UA RARE (*)    All other components within normal limits    EKG None  Radiology No results found.  Procedures Procedures    Medications Ordered in ED Medications  lidocaine (LIDODERM) 5 % 1 patch (has no administration in time range)  potassium chloride SA (KLOR-CON M) CR tablet 20 mEq (20 mEq Oral Given 10/06/22 2034)    ED Course/ Medical Decision Making/ A&P                             Medical Decision Making Risk Prescription drug management.    67 year old female presenting to the emergency department with right-sided low back pain.  The patient states that 2 days ago she woke up with pain in her right back.  It was worsened with movement.  She denies any abdominal pain, denies any nausea or vomiting.  Denies any fevers or chills, burning with urination or increased urinary frequency.  She took Motrin here in the ED and her pain has completely resolved.  She denies any red flag symptoms of low back pain, denies any falls or trauma, denies any fevers or chills, denies any urinary or fecal incontinence, denies any saddle anesthesia.  On arrival, the patient was afebrile, mildly tachycardic, subsequently resolved on cardiac telemetry, BP 140/82, saturating 94% on room air.  Physical exam significant for right lower lumbar spinal muscular tenderness to palpation, no midline tenderness to palpation, 5 out of 5 strength in all 4 extremities with intact sensation to light touch.  The patient is able to ambulate and is hemodynamically stable. There  are no red flag symptoms. Specifically, she denies: -Being on an anticoagulant or blood thinner -Having a history of AAA -Having saddle anesthesia -Having urinary or fecal incontinence -Having any recent falls or trauma  Differential Diagnoses: I do not think that Addelyn L Gaiser is experiencing cauda equina syndrome, abdominal aortic aneurysm, epidural abscess, aortic dissection, spinal hematoma, nephrolithiasis, spinal metastasis, discitis, or an acute fracture.  The patient is able to ambulate.  While in the ED, I provided the patient with: Medications  lidocaine (LIDODERM) 5 % 1 patch (has no administration in  time range)  potassium chloride SA (KLOR-CON M) CR tablet 20 mEq (20 mEq Oral Given 10/06/22 2034)    On reassessment, the patient the patient's pain had completely resolved. This presentation is most consistent with lumbar strain.  Given the patient's reassuring presentation, I believe that she is safe for discharge.  I provided ED return precautions, specifically for the symptoms which are most concerning (e.g., saddle anesthesia, urinary or bowel incontinence or retention, changing or worsening pain), which would necessitate immediate return.  I encouraged the patient to followup with their PCP.   Final Clinical Impression(s) / ED Diagnoses Final diagnoses:  Strain of lumbar region, initial encounter    Rx / DC Orders ED Discharge Orders          Ordered    lidocaine (LIDODERM) 5 %  Every 24 hours        10/06/22 2114              Regan Lemming, MD 10/06/22 2119

## 2022-10-06 NOTE — ED Triage Notes (Signed)
Pt here with R lower back pain onset 2 days ago. Denies injury. Denies urinary symptoms.

## 2022-10-06 NOTE — ED Provider Triage Note (Signed)
Emergency Medicine Provider Triage Evaluation Note  Colleen Williams , a 67 y.o. female  was evaluated in triage.  Pt complains of right flank pain radiating to RLQ, onset two days ago.  No history of kidney stones. Denies urinary symptoms. Pain not exacerbated by movement, change of position, palpation..  Review of Systems  Positive: Flank pain Negative: Dysuria, fever, chills  Physical Exam  BP (!) 140/82   Pulse (!) 102   Temp 98.2 F (36.8 C)   Resp 19   SpO2 94%  Gen:   Awake, no distress   Resp:  Normal effort  MSK:   Moves extremities without difficulty  Other:  Right flank pain radiating to RLQ  Medical Decision Making  Medically screening exam initiated at 7:02 PM.  Appropriate orders placed.  Cliffie L Williams was informed that the remainder of the evaluation will be completed by another provider, this initial triage assessment does not replace that evaluation, and the importance of remaining in the ED until their evaluation is complete.     Colleen Quill, NP 10/06/22 Colleen Williams

## 2022-10-10 ENCOUNTER — Telehealth: Payer: Self-pay | Admitting: *Deleted

## 2022-10-10 ENCOUNTER — Telehealth: Payer: Self-pay

## 2022-10-10 NOTE — Progress Notes (Signed)
Received message from Larena Sox that patient Colleen Williams need assistance with unknown medication. Called patient Colleen Williams to verify medication assistance need. Patient  Colleen Williams made aware she needs assistance with Lidocaine patches. Routed staff message to Barnabas Harries to send to proficient for Pharmacy needs.   .[place]  Direct Dial: [phone]

## 2022-10-10 NOTE — Patient Outreach (Signed)
Received a Pharmacy referral for a Colleen Williams. I have forward the referral to [facility] through [place].     [facility], [place], [place] [facility] [phone]

## 2022-10-10 NOTE — Telephone Encounter (Signed)
     Patient  visit on 1/21  at [facility]   Patient couldnt get her medications filled and has not followed up with PCP yet but has an appointment set up    Have you been able to follow up with your primary care physician? No   The patient was or was not able to obtain any needed medicine or equipment. No   Are there diet recommendations that you are having difficulty following? NA  Patient expresses understanding of discharge instructions and education provided has no other needs at this time.   [place], [facility], Care Management  [phone] [address], [place], [place] [address] Phone: [phone] Email: [email or web address]

## 2022-10-11 ENCOUNTER — Telehealth: Payer: Self-pay | Admitting: Pharmacist

## 2022-10-11 NOTE — Progress Notes (Signed)
[facility]) Care Management  [place]   10/11/2022  STELLAROSE CERNY Oct 01, 1955 [id number]  Reason for referral: medication assistance  Referral source: Kandra Nicolas Referral medication(s): Lidocaine Current insurance:[employer]  HPI: Received referral to help patient with the cost of lidocaine patches.   Medication Assistance Findings:  Medication assistance needs identified: Called patient regarding medication assistance with Lidocaine patches.  Unfortunately, there is not a patient assistance program for Lidocaine patches other than Rx Outreach which will provide a box of 30 patches for $90--the patient would have to provide a debit card for purchase.    Lidocaine 5% patches are also available OTC. In a RCT from the journal, Pain Management 2017 Nov;7(6):489-498. doi: 10.2217/pmt-2017-0029. Epub 2017 Aug 14. --OTC lidocaine 4% patch is just as effective as the prescription Lidocaine patches.  [employer] carries Lidocaine 4% patches for $7.12 for 6 patches.  Additional medication assistance options reviewed with patient as warranted:  No other options identified  Plan: I will route note to patient's provider.  [name], PharmD, [place] Clinical Pharmacist [phone]

## 2022-10-17 ENCOUNTER — Telehealth: Payer: Self-pay | Admitting: Pharmacist

## 2022-10-17 NOTE — Progress Notes (Signed)
Colleen Williams) Care Management  [place]   10/17/2022  Colleen Williams 10/18/1955 [id number]  Reason for referral: medication assistance  Referral source: Provider Marzetta Board Sneed sent Referral) Referral medication(s): Lidocaine Patches Current insurance:UHC   Medication Assistance Findings:  Medication assistance needs identified: Lidocaine patches.  Sent provider a message requesting the use of OTC patches and he agreed with the suggestion.  Called patient.  She said she was no longer interested in getting help but thanked me for the information and for calling.  Response from the patient's provider: Regan Lemming, MD  [name], [place] Im perfectly fine with OTC patches, the prices and how much a patient pays for prescription patches dont make sense and seem vary wildly.  Regan Lemming    Additional medication assistance options reviewed with patient as warranted:  No other options identified  Plan: Close patient's case will gladly reopen if needed.  [name], PharmD, [place] Clinical Pharmacist [phone]

## 2022-10-28 ENCOUNTER — Encounter: Payer: Self-pay | Admitting: Student

## 2022-10-28 ENCOUNTER — Other Ambulatory Visit: Payer: Self-pay

## 2022-10-28 ENCOUNTER — Ambulatory Visit (INDEPENDENT_AMBULATORY_CARE_PROVIDER_SITE_OTHER): Payer: 59 | Admitting: Student

## 2022-10-28 VITALS — BP 133/88 | HR 92 | Temp 97.9°F | Ht 65.0 in | Wt 222.9 lb

## 2022-10-28 DIAGNOSIS — Z87891 Personal history of nicotine dependence: Secondary | ICD-10-CM

## 2022-10-28 DIAGNOSIS — Z7984 Long term (current) use of oral hypoglycemic drugs: Secondary | ICD-10-CM | POA: Diagnosis not present

## 2022-10-28 DIAGNOSIS — E785 Hyperlipidemia, unspecified: Secondary | ICD-10-CM

## 2022-10-28 DIAGNOSIS — E119 Type 2 diabetes mellitus without complications: Secondary | ICD-10-CM

## 2022-10-28 DIAGNOSIS — E1169 Type 2 diabetes mellitus with other specified complication: Secondary | ICD-10-CM

## 2022-10-28 DIAGNOSIS — I10 Essential (primary) hypertension: Secondary | ICD-10-CM | POA: Diagnosis not present

## 2022-10-28 DIAGNOSIS — Z6837 Body mass index (BMI) 37.0-37.9, adult: Secondary | ICD-10-CM

## 2022-10-28 DIAGNOSIS — Z0000 Encounter for general adult medical examination without abnormal findings: Secondary | ICD-10-CM

## 2022-10-28 LAB — POCT GLYCOSYLATED HEMOGLOBIN (HGB A1C): Hemoglobin A1C: 7 % — AB (ref 4.0–5.6)

## 2022-10-28 LAB — GLUCOSE, CAPILLARY: Glucose-Capillary: 156 mg/dL — ABNORMAL HIGH (ref 70–99)

## 2022-10-28 MED ORDER — GLUCOSE BLOOD VI STRP: ORAL_STRIP | 5 refills | Status: DC

## 2022-10-28 NOTE — Assessment & Plan Note (Signed)
BP Readings from Last 3 Encounters:  10/28/22 133/88  10/06/22 (!) 135/91  07/24/22 130/81   Blood pressure mildly elevated today. Patient recently stopped taking hydrochlorothiazide given her most recent lab work showed mild hypokalemia. She had been on this medication long-term without any issues. Denies dizziness, lightheadedness. Discussed re-starting this medication, she is agreeable.  - Continue losartan 22m daily - Re-start hydrochlorothiazide 12.563mdaily - BMP today

## 2022-10-28 NOTE — Assessment & Plan Note (Signed)
Patient would like to defer DEXA scan and PVC20 vaccination. She is interested in Shingrix, for which she will get at her pharmacy.

## 2022-10-28 NOTE — Progress Notes (Signed)
   CC: follow-up  HPI:  [name] is a 67 y.o. person with type II diabetes mellitus, hypertension, hyperlipidemia, osteoarthritis presenting to [facility] for follow-up.  Please see problem-based list for further details, assessments, and plans.  Past Medical History:  Diagnosis Date   Arthritis    Diabetes mellitus without complication ([place])    Hyperlipidemia    Hypertension    Vitreous floaters of both eyes 08/03/2017   Review of Systems:  As per HPI  Physical Exam:  Vitals:   10/28/22 0946  BP: 133/88  Pulse: 92  Temp: 97.9 F (36.6 C)  TempSrc: Oral  SpO2: 97%  Weight: 222 lb 14.4 oz (101.1 kg)  Height: '5\' 5"'$  (1.651 m)   General: Resting comfortably in no acute distress CV: Regular rate, rhythm. No murmurs appreciated. Warm extremities.  Pulm: Normal work of breathing on room air. Clear to auscultation bilaterally. GI: Abdomen soft, non-tender, non-distended. Normoactive bowel sounds. MSK: Normal bulk, tone. No peripheral edema noted. Skin: Warm, dry. No rashes or lesions appreciated. Neuro: Awake, alert, conversing appropriately. Grossly non-focal. Psych: Normal mood, affect, speech.  Assessment & Plan:   Essential hypertension BP Readings from Last 3 Encounters:  10/28/22 133/88  10/06/22 (!) 135/91  07/24/22 130/81   Blood pressure mildly elevated today. Patient recently stopped taking hydrochlorothiazide given her most recent lab work showed mild hypokalemia. She had been on this medication long-term without any issues. Denies dizziness, lightheadedness. Discussed re-starting this medication, she is agreeable.  - Continue losartan '50mg'$  daily - Re-start hydrochlorothiazide 12.'5mg'$  daily - BMP today  Type 2 diabetes mellitus (HCC) A1c 7.0% from 7.3% at last visit. She has been keeping up with her sugars and her food with a log. It appears her sugars have consistently been in the 100's, mostly 120-150. She has not had any hypoglycemic symptoms, vision  changes, polyuria, polydipsia. She has been tolerating her medications well. She has an ophthalmology visit scheduled later this month.   - Continue empagliflozin-metformin 5-'1000mg'$  twice daily - Continue rosuvastatin '20mg'$  daily - Follow-up with ophthalmologist this month   Hyperlipidemia associated with type 2 diabetes mellitus ([place]) Patient reports she sometimes will not take her rosuvastatin, although denies any side effects from this. We will plan to check lipid panel today.  - Rosuvastatin '20mg'$  daily - Lipid panel today  Healthcare maintenance Patient would like to defer DEXA scan and PVC20 vaccination. She is interested in Shingrix, for which she will get at her pharmacy.   Morbid obesity ([place]) Weight stable since last visit. Patient with BMI >35 with hypertension, type II diabetes mellitus, hyperlipidemia, and osteoarthritis.    Patient discussed with Dr. Denita Lung, MD Internal Medicine PGY-3 Pager: [phone]

## 2022-10-28 NOTE — Assessment & Plan Note (Signed)
A1c 7.0% from 7.3% at last visit. She has been keeping up with her sugars and her food with a log. It appears her sugars have consistently been in the 100's, mostly 120-150. She has not had any hypoglycemic symptoms, vision changes, polyuria, polydipsia. She has been tolerating her medications well. She has an ophthalmology visit scheduled later this month.   - Continue empagliflozin-metformin 5-1058m twice daily - Continue rosuvastatin 269mdaily - Follow-up with ophthalmologist this month

## 2022-10-28 NOTE — Patient Instructions (Signed)
Colleen Williams, it was a pleasure seeing you today!  Today we discussed: - Your A1c today is 7.0, improved from 7.3% - congratulations! We will continue with your current medications.  - I would like for you to re-start the hydrochlorothiazide and rosuvastatin daily. The rosuvastatin is for your cholesterol and to help reduce the risk of heart attacks and strokes.  - You can get the Shingles vaccine at the local pharmacy.  - I will see you back in three months  I have ordered the following labs today:   Lab Orders         Glucose, capillary         BMP8+Anion Gap         Lipid Profile         POC Hbg A1C      Follow-up: 3 months   Please make sure to arrive 15 minutes prior to your next appointment. If you arrive late, you may be asked to reschedule.   We look forward to seeing you next time. Please call our clinic at [phone] if you have any questions or concerns. The best time to call is Monday-Friday from 9am-4pm, but there is someone available 24/7. If after hours or the weekend, call the main hospital number and ask for the Internal Medicine Resident On-Call. If you need medication refills, please notify your pharmacy one week in advance and they will send [place] a request.  Thank you for letting us take part in your care. Wishing you the best!  Thank you, Sanjuan Dame, MD

## 2022-10-28 NOTE — Assessment & Plan Note (Signed)
Weight stable since last visit. Patient with BMI >35 with hypertension, type II diabetes mellitus, hyperlipidemia, and osteoarthritis.

## 2022-10-28 NOTE — Assessment & Plan Note (Signed)
Patient reports she sometimes will not take her rosuvastatin, although denies any side effects from this. We will plan to check lipid panel today.  - Rosuvastatin 33m daily - Lipid panel today

## 2022-10-30 LAB — BMP8+ANION GAP
Anion Gap: 15 mmol/L (ref 10.0–18.0)
BUN/Creatinine Ratio: 32 — ABNORMAL HIGH (ref 12–28)
BUN: 18 mg/dL (ref 8–27)
CO2: 22 mmol/L (ref 20–29)
Calcium: 9.6 mg/dL (ref 8.7–10.3)
Chloride: 109 mmol/L — ABNORMAL HIGH (ref 96–106)
Creatinine, Ser: 0.57 mg/dL (ref 0.57–1.00)
Glucose: 119 mg/dL — ABNORMAL HIGH (ref 70–99)
Potassium: 4.5 mmol/L (ref 3.5–5.2)
Sodium: 146 mmol/L — ABNORMAL HIGH (ref 134–144)
eGFR: 100 mL/min/{1.73_m2} (ref >59)

## 2022-10-30 LAB — LIPID PANEL
Chol/HDL Ratio: 2.5 ratio (ref 0.0–4.4)
Cholesterol, Total: 130 mg/dL (ref 100–199)
HDL: 53 mg/dL (ref >39)
LDL Chol Calc (NIH): 63 mg/dL (ref 0–99)
Triglycerides: 69 mg/dL (ref 0–149)
VLDL Cholesterol Cal: 14 mg/dL (ref 5–40)

## 2022-10-30 NOTE — Progress Notes (Signed)
Internal Medicine Clinic Attending  Case discussed with the resident at the time of the visit.  We reviewed the resident's history and exam and pertinent patient test results.  I agree with the assessment, diagnosis, and plan of care documented in the resident's note.  

## 2022-11-04 DIAGNOSIS — H11153 Pinguecula, bilateral: Secondary | ICD-10-CM | POA: Diagnosis not present

## 2022-11-04 DIAGNOSIS — H25813 Combined forms of age-related cataract, bilateral: Secondary | ICD-10-CM | POA: Diagnosis not present

## 2022-11-04 DIAGNOSIS — H524 Presbyopia: Secondary | ICD-10-CM | POA: Diagnosis not present

## 2022-11-04 DIAGNOSIS — E119 Type 2 diabetes mellitus without complications: Secondary | ICD-10-CM | POA: Diagnosis not present

## 2022-11-04 DIAGNOSIS — H401231 Low-tension glaucoma, bilateral, mild stage: Secondary | ICD-10-CM | POA: Diagnosis not present

## 2022-11-04 LAB — HM DIABETES EYE EXAM

## 2022-12-03 DIAGNOSIS — H401231 Low-tension glaucoma, bilateral, mild stage: Secondary | ICD-10-CM | POA: Diagnosis not present

## 2022-12-05 ENCOUNTER — Encounter: Payer: Self-pay | Admitting: Dietician

## 2022-12-12 ENCOUNTER — Other Ambulatory Visit: Payer: Self-pay | Admitting: Internal Medicine

## 2022-12-12 NOTE — Telephone Encounter (Signed)
Next appt scheduled 5/13 with Dr Nikki Dom.

## 2023-01-26 IMAGING — MG MM DIGITAL SCREENING BILAT W/ TOMO AND CAD
8 series · 8 of 24 positions shown · non-contrast
Comparison: Previous exam(s).

CLINICAL DATA: Screening.

EXAM:
DIGITAL SCREENING BILATERAL MAMMOGRAM WITH TOMOSYNTHESIS AND CAD
TECHNIQUE: Bilateral screening digital craniocaudal and mediolateral oblique
mammograms were obtained. Bilateral screening digital breast
tomosynthesis was performed. The images were evaluated with
computer-aided detection.

[L CC synth-2D]
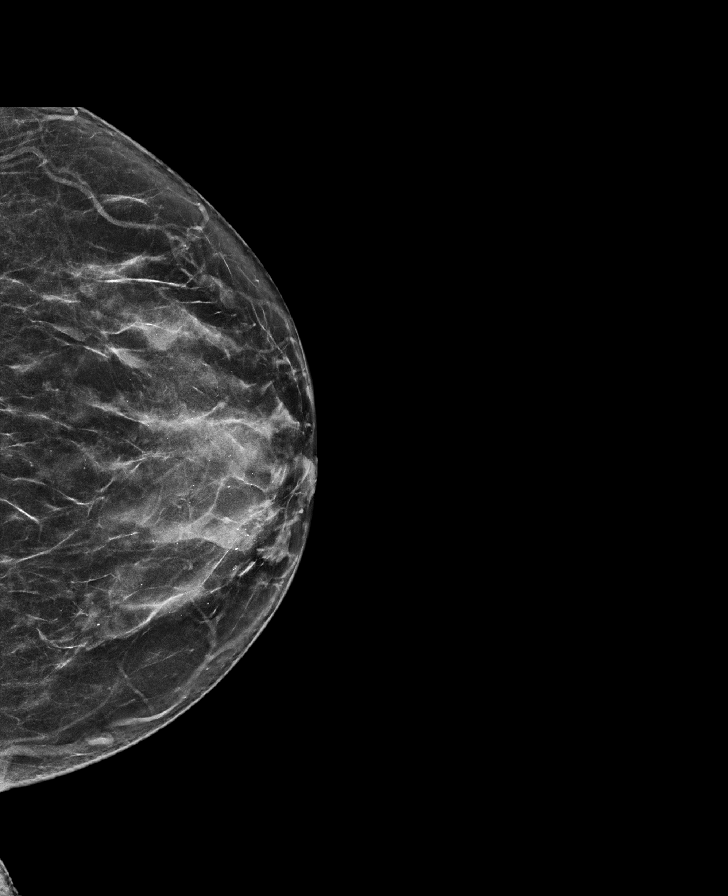

[R CC synth-2D]
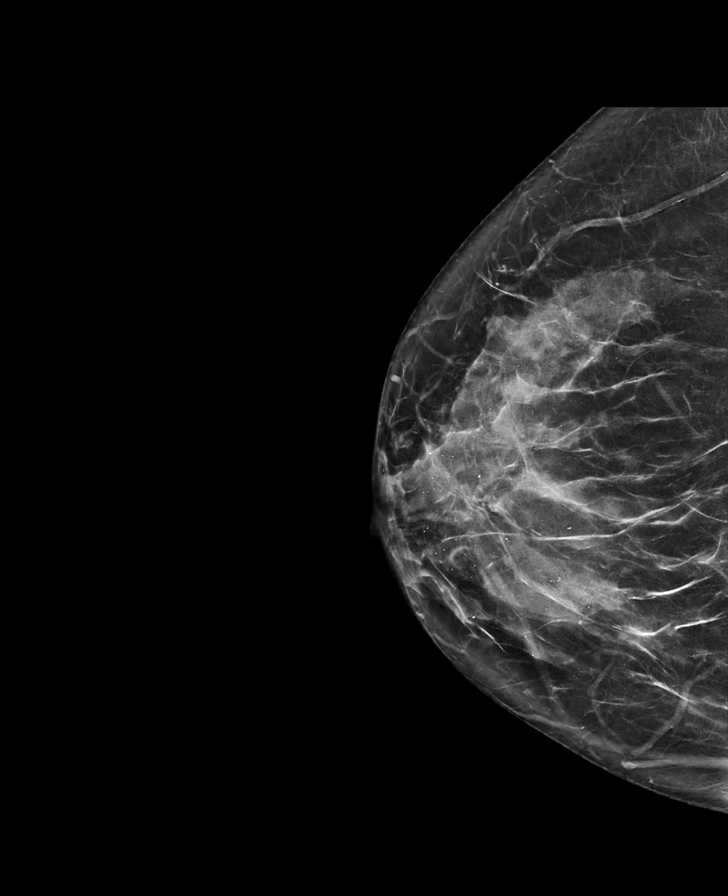

[R MLO synth-2D]
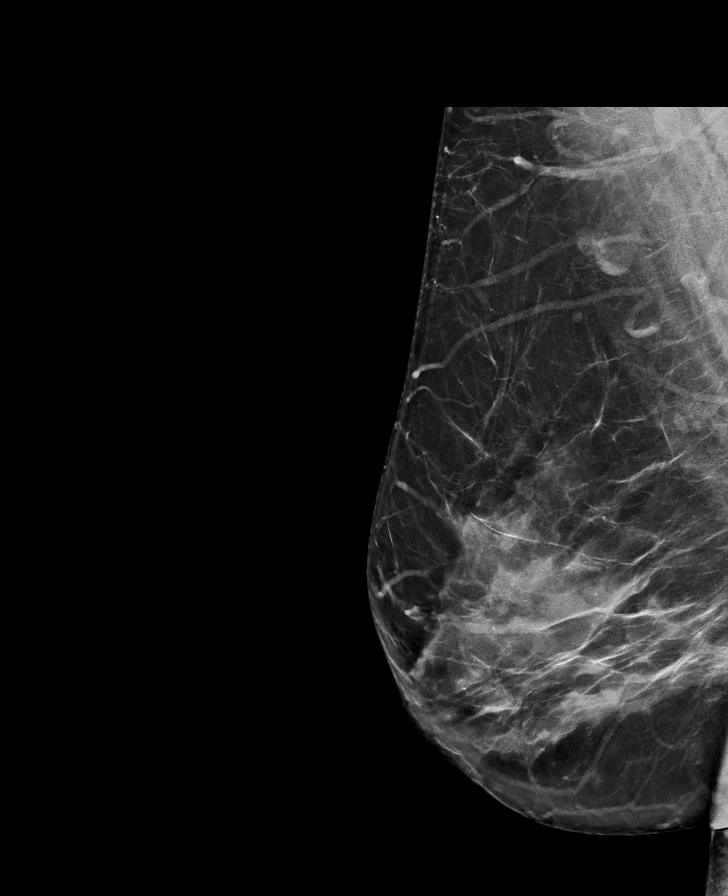

[L MLO synth-2D]
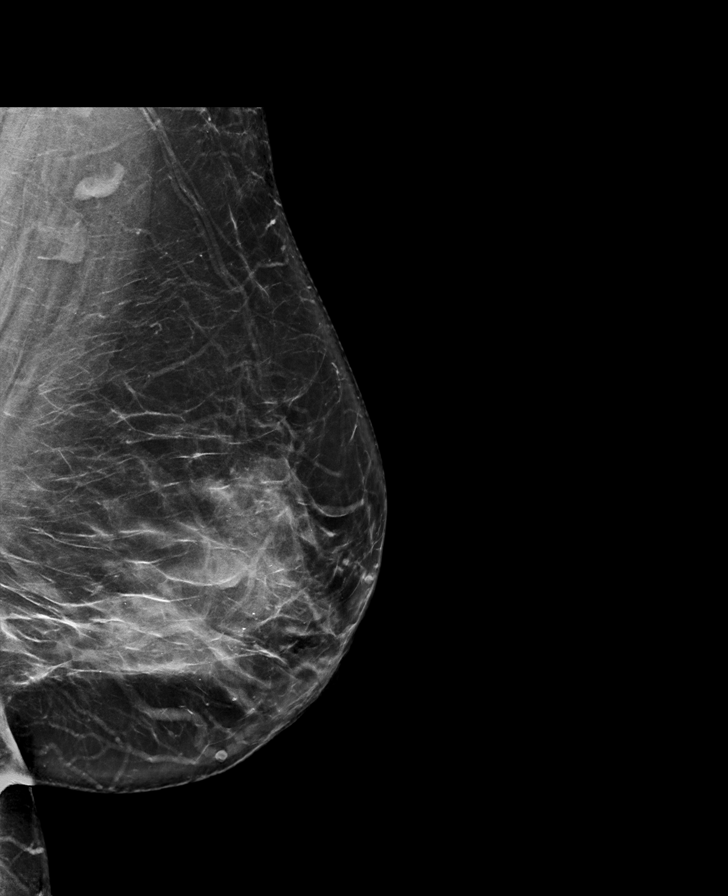

[L MLO tomo · tomo slice 43/85.0]
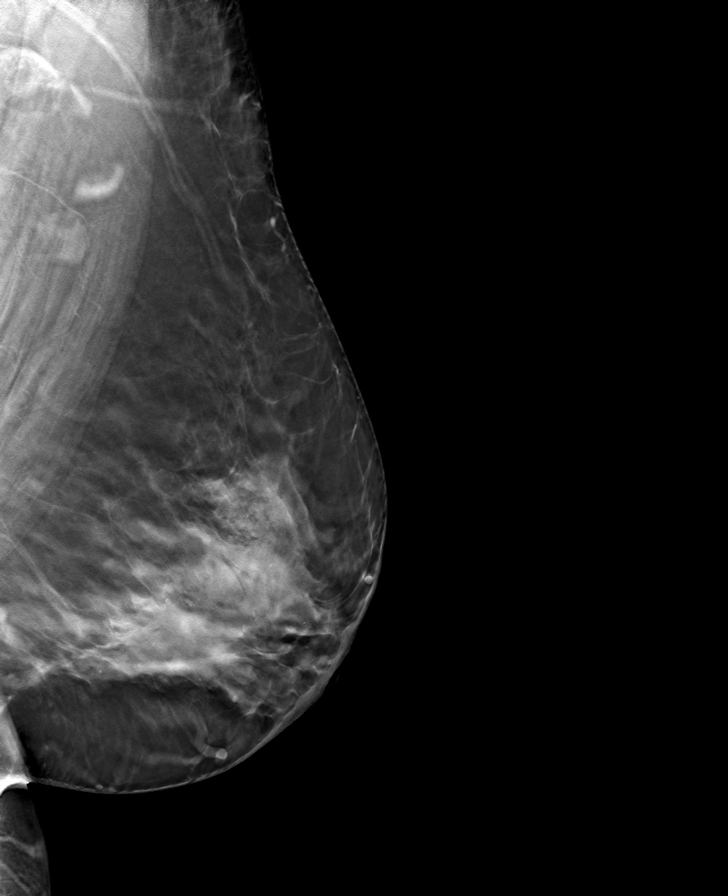

[R CC tomo · tomo slice 37/73.0]
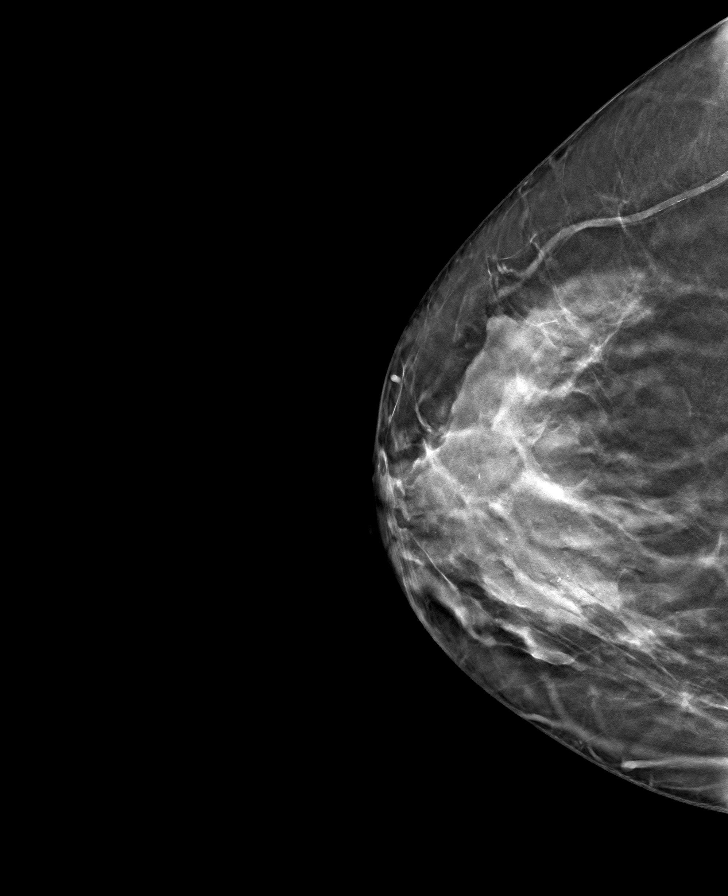

[L CC tomo · tomo slice 35/68.0]
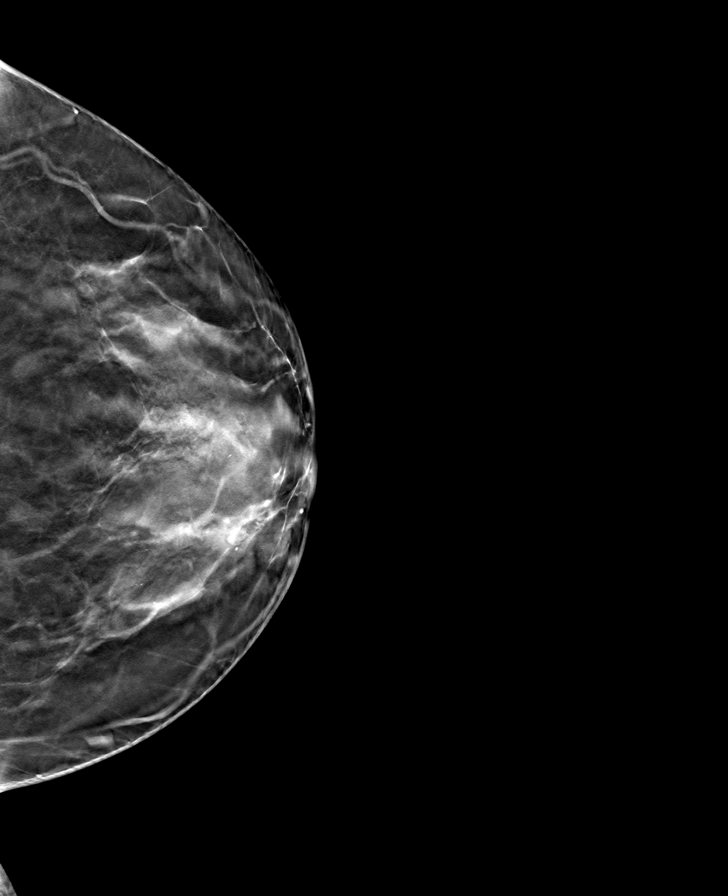

[R MLO tomo · tomo slice 44/87.0]
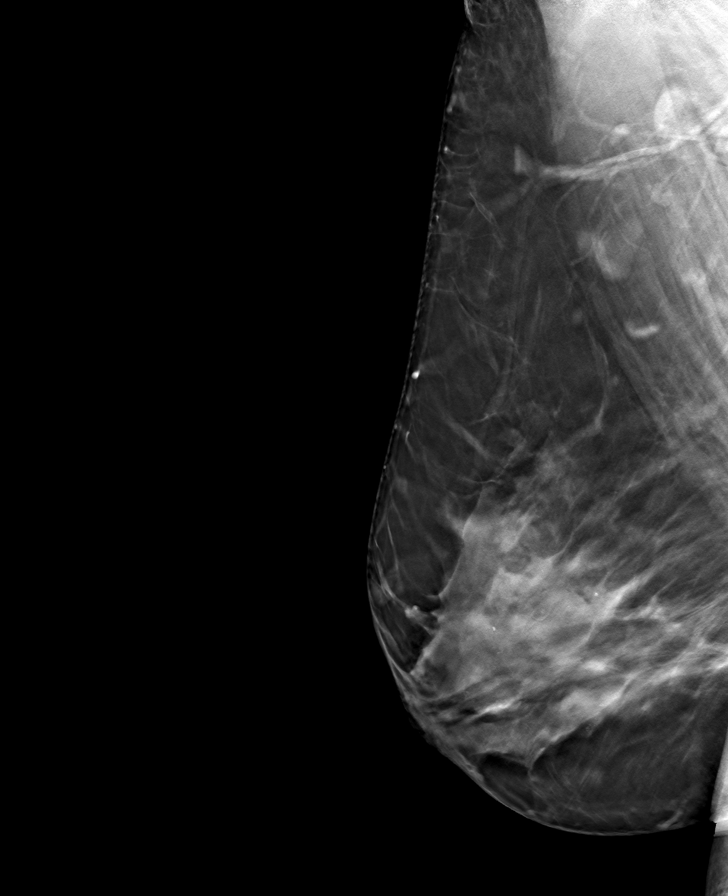

[8 of 24 positions shown; findings below may reference images not displayed]

ACR Breast Density Category c: The breast tissue is heterogeneously
dense, which may obscure small masses.
FINDINGS: There are no findings suspicious for malignancy.
IMPRESSION: No mammographic evidence of malignancy. A result letter of this
screening mammogram will be mailed directly to the patient.

RECOMMENDATION:
Screening mammogram in one year. (Code:[id number])

BI-RADS CATEGORY  1: Negative.

## 2023-01-27 ENCOUNTER — Ambulatory Visit (INDEPENDENT_AMBULATORY_CARE_PROVIDER_SITE_OTHER): Payer: 59 | Admitting: Student

## 2023-01-27 VITALS — BP 123/80 | HR 80 | Ht 65.0 in | Wt 220.1 lb

## 2023-01-27 DIAGNOSIS — E1169 Type 2 diabetes mellitus with other specified complication: Secondary | ICD-10-CM

## 2023-01-27 DIAGNOSIS — I10 Essential (primary) hypertension: Secondary | ICD-10-CM | POA: Diagnosis not present

## 2023-01-27 DIAGNOSIS — Z7984 Long term (current) use of oral hypoglycemic drugs: Secondary | ICD-10-CM

## 2023-01-27 DIAGNOSIS — E785 Hyperlipidemia, unspecified: Secondary | ICD-10-CM

## 2023-01-27 DIAGNOSIS — Z6836 Body mass index (BMI) 36.0-36.9, adult: Secondary | ICD-10-CM

## 2023-01-27 DIAGNOSIS — Z0000 Encounter for general adult medical examination without abnormal findings: Secondary | ICD-10-CM

## 2023-01-27 DIAGNOSIS — Z8639 Personal history of other endocrine, nutritional and metabolic disease: Secondary | ICD-10-CM | POA: Diagnosis not present

## 2023-01-27 DIAGNOSIS — Z1231 Encounter for screening mammogram for malignant neoplasm of breast: Secondary | ICD-10-CM

## 2023-01-27 LAB — POCT GLYCOSYLATED HEMOGLOBIN (HGB A1C): Hemoglobin A1C: 6.5 % — AB (ref 4.0–5.6)

## 2023-01-27 LAB — GLUCOSE, CAPILLARY: Glucose-Capillary: 161 mg/dL — ABNORMAL HIGH (ref 70–99)

## 2023-01-27 MED ORDER — SYNJARDY 5-500 MG PO TABS: 1.0000 | ORAL_TABLET | Freq: Two times a day (BID) | ORAL | 3 refills | Status: DC

## 2023-01-27 MED ORDER — ROSUVASTATIN CALCIUM 20 MG PO TABS: 20.0000 mg | ORAL_TABLET | Freq: Every day | ORAL | 3 refills | Status: DC

## 2023-01-27 NOTE — Assessment & Plan Note (Signed)
Last mammogram appears to be in 2022.  Patient is also interested in Shingrix vaccine but not interested in DEXA scan or PCV 20 vaccine. - Mammogram ordered and patient will get Shingrix vaccine at pharmacy

## 2023-01-27 NOTE — Assessment & Plan Note (Signed)
A1c down to 6.5%.  Patient is doing well focusing on her diet and daily exercise which includes walking nearly a mile every day.  She has had intermittent forefoot pain without signs of claudication, not consistent with neuropathy, no wounds, no significant edema or abnormalities on physical exam except for bilateral hallux valgus without pain.  Diabetic eye exam completed 10/2022.  Last ACR 07/2022 within normal limits. - Continue Synjardy 5-500 mg twice daily and rosuvastatin 20 mg daily - BMP today and return in 3 months for repeat A1c

## 2023-01-27 NOTE — Assessment & Plan Note (Signed)
Patient is working on her weight with diet and exercise and has lost 2 pounds over the past 3 months.  She has noticed fluctuations in her weight and has recently been 4 pounds lighter than she has today.  She is planning on exercising with walking every day up to a mile.  We discussed GLP-1 agonist therapy in addition to her ongoing lifestyle modifications.  She is not interested in this therapy at the moment but we will reevaluate at future visits.

## 2023-01-27 NOTE — Progress Notes (Addendum)
   CC: Follow-up for diabetes and hypertension  HPI:  Colleen Williams is a 67 y.o. female with PMH as below who presents to the clinic for 64-month follow-up visit.  Please see assessment plan for further details.  Past Medical History:  Diagnosis Date   Arthritis    Diabetes mellitus without complication (HCC)    Hyperlipidemia    Hypertension    Vitreous floaters of both eyes 08/03/2017   Review of Systems:   Pertinent items noted in HPI and/or A&P.  Physical Exam:  Vitals:   01/27/23 0822  BP: 123/80  Pulse: 80  SpO2: 99%  Weight: 220 lb 1.6 oz (99.8 kg)  Height: 5\' 5"  (1.651 m)    Constitutional: Well-appearing middle-age female. In no acute distress. HEENT: Normocephalic, atraumatic, Sclera non-icteric, PERRL, EOM intact Cardio:Regular rate and rhythm. 2+ bilateral radial and dorsalis pedis  pulses. Pulm:Clear to auscultation bilaterally. Normal work of breathing on room air. Abdomen: Soft, non-tender, non-distended, positive bowel sounds. WUJ:[id number] for extremity edema.  Bilateral hallux valgus without tenderness.  Bilateral foot exam without significant abnormality, no pain in metatarsal squeeze, grossly intact sensation, no wounds, no findings consistent with arterial insufficiency, no significant edema. Skin:Warm and dry. Neuro:Alert and oriented x3. No focal deficit noted. Psych:Pleasant mood and affect.   Assessment & Plan:   Essential hypertension Blood pressure at goal today at 123/80.  Last BMP from 10/2022 without renal dysfunction or potassium abnormality.  Has had hypokalemia in the past and is on hydrochlorothiazide.  Will repeat a BMP today as this medication was restarted in 10/2022. - Continue losartan 50 mg daily and hydrochlorothiazide 12.5 mg daily - Repeat BMP today  Type 2 diabetes mellitus (HCC) A1c down to 6.5%.  Patient is doing well focusing on her diet and daily exercise which includes walking nearly a mile every day.  She has had  intermittent forefoot pain without signs of claudication, not consistent with neuropathy, no wounds, no significant edema or abnormalities on physical exam except for bilateral hallux valgus without pain.  Diabetic eye exam completed 10/2022.  Last ACR 07/2022 within normal limits. - Continue Synjardy 5-500 mg twice daily and rosuvastatin 20 mg daily - BMP today and return in 3 months for repeat A1c  Healthcare maintenance Last mammogram appears to be in 2022.  Patient is also interested in Shingrix vaccine but not interested in DEXA scan or PCV 20 vaccine. - Mammogram ordered and patient will get Shingrix vaccine at pharmacy  Morbid obesity [facility]) Patient is working on her weight with diet and exercise and has lost 2 pounds over the past 3 months.  She has noticed fluctuations in her weight and has recently been 4 pounds lighter than she has today.  She is planning on exercising with walking every day up to a mile.  We discussed GLP-1 agonist therapy in addition to her ongoing lifestyle modifications.  She is not interested in this therapy at the moment but we will reevaluate at future visits.    Patient discussed with Dr. Kris Mouton, DO [facility] Internal [facility] Resident PGY-1 Pager: [phone]

## 2023-01-27 NOTE — Assessment & Plan Note (Signed)
Blood pressure at goal today at 123/80.  Last BMP from 10/2022 without renal dysfunction or potassium abnormality.  Has had hypokalemia in the past and is on hydrochlorothiazide.  Will repeat a BMP today as this medication was restarted in 10/2022. - Continue losartan 50 mg daily and hydrochlorothiazide 12.5 mg daily - Repeat BMP today

## 2023-01-27 NOTE — Progress Notes (Signed)
[facility] Attending  Case discussed with Dr. Goodwin  At the time of the visit.  We reviewed the resident's history and exam and pertinent patient test results.  I agree with the assessment, diagnosis, and plan of care documented in the resident's note.  

## 2023-01-27 NOTE — Patient Instructions (Addendum)
  Thank you, Colleen Williams, for allowing [place] to provide your care today. Today we discussed . . .  > Diabetes       -You are doing a fantastic job with your diabetes!  Your A1c is down to 6.5%.  Please continue to focus on your diet and exercise and we will not make any changes to your medications.  Please keep your follow-up with your ophthalmologist and if there are any issues do not hesitate to call our office.  We will have you back in 3 months to recheck your A1c again. > Blood pressure       -Your blood pressure is doing fantastic.  Please continue to take your medications and as above continue to have a well-balanced diet and daily exercise. > Foot pain       -I am not sure if this pain is diabetic neuropathy especially since your A1c continues to get better.  I would continue to take ibuprofen 200 mg as needed but if you are using this more than 3 times a week please give [place] a call.  You may also take Tylenol 1000 mg up to 3 times a day if needed.  There are also over-the-counter gels such as Voltaren that you may rub on your feet if you do not want to take any pills. > Vaccines and screening testing       -Please follow-up with your pharmacy to get the shingles vaccine.  I have also sent an order to get your mammogram and they should call you to schedule this.  If you would like to get the pneumonia vaccine or a DEXA scan please do not hesitate to call our office and we can set these up.   I have ordered the following labs for you:   Lab Orders         BMP8+Anion Gap         Glucose, capillary         POC Hbg A1C       Referrals ordered today:   Referral Orders  No referral(s) requested today      I have ordered the following medication/changed the following medications:   Start the following medications: Meds ordered this encounter  Medications   Empagliflozin-metFORMIN HCl (SYNJARDY) 5-500 MG TABS    Sig: Take 1 tablet by mouth in the morning and at bedtime.     Dispense:  180 tablet    Refill:  3   rosuvastatin (CRESTOR) 20 MG tablet    Sig: Take 1 tablet (20 mg total) by mouth daily.    Dispense:  90 tablet    Refill:  3      Follow up: 3 months    Remember:     Should you have any questions or concerns please call the internal medicine clinic at [phone].     Rocky Morel, DO [facility]

## 2023-01-28 LAB — BMP8+ANION GAP
Anion Gap: 14 mmol/L (ref 10.0–18.0)
BUN/Creatinine Ratio: 29 — ABNORMAL HIGH (ref 12–28)
BUN: 18 mg/dL (ref 8–27)
CO2: 25 mmol/L (ref 20–29)
Calcium: 9.6 mg/dL (ref 8.7–10.3)
Chloride: 99 mmol/L (ref 96–106)
Creatinine, Ser: 0.63 mg/dL (ref 0.57–1.00)
Glucose: 121 mg/dL — ABNORMAL HIGH (ref 70–99)
Potassium: 4.3 mmol/L (ref 3.5–5.2)
Sodium: 138 mmol/L (ref 134–144)
eGFR: 98 mL/min/{1.73_m2} (ref >59)

## 2023-01-28 NOTE — Progress Notes (Signed)
Called and spoke to the patient. BMP is stable. She is doing well and has no questions or concerns at this time.

## 2023-02-16 ENCOUNTER — Encounter (HOSPITAL_COMMUNITY): Payer: Self-pay | Admitting: *Deleted

## 2023-02-16 ENCOUNTER — Emergency Department (HOSPITAL_COMMUNITY)
Admission: EM | Admit: 2023-02-16 | Discharge: 2023-02-17 | Disposition: A | Discharge status: Home / Self Care | Payer: 59 | Attending: Emergency Medicine | Admitting: Emergency Medicine

## 2023-02-16 ENCOUNTER — Other Ambulatory Visit: Payer: Self-pay

## 2023-02-16 DIAGNOSIS — G4733 Obstructive sleep apnea (adult) (pediatric): Secondary | ICD-10-CM | POA: Diagnosis not present

## 2023-02-16 DIAGNOSIS — Z8719 Personal history of other diseases of the digestive system: Secondary | ICD-10-CM | POA: Diagnosis not present

## 2023-02-16 DIAGNOSIS — Z7984 Long term (current) use of oral hypoglycemic drugs: Secondary | ICD-10-CM | POA: Diagnosis not present

## 2023-02-16 DIAGNOSIS — J449 Chronic obstructive pulmonary disease, unspecified: Secondary | ICD-10-CM | POA: Diagnosis not present

## 2023-02-16 DIAGNOSIS — I10 Essential (primary) hypertension: Secondary | ICD-10-CM | POA: Diagnosis not present

## 2023-02-16 DIAGNOSIS — M793 Panniculitis, unspecified: Secondary | ICD-10-CM | POA: Diagnosis not present

## 2023-02-16 DIAGNOSIS — E119 Type 2 diabetes mellitus without complications: Secondary | ICD-10-CM | POA: Diagnosis not present

## 2023-02-16 DIAGNOSIS — I252 Old myocardial infarction: Secondary | ICD-10-CM | POA: Diagnosis not present

## 2023-02-16 DIAGNOSIS — E785 Hyperlipidemia, unspecified: Secondary | ICD-10-CM | POA: Diagnosis not present

## 2023-02-16 DIAGNOSIS — K654 Sclerosing mesenteritis: Secondary | ICD-10-CM | POA: Diagnosis not present

## 2023-02-16 DIAGNOSIS — R100 Acute abdomen: Secondary | ICD-10-CM | POA: Diagnosis not present

## 2023-02-16 DIAGNOSIS — Z794 Long term (current) use of insulin: Secondary | ICD-10-CM | POA: Diagnosis not present

## 2023-02-16 DIAGNOSIS — R109 Unspecified abdominal pain: Secondary | ICD-10-CM | POA: Diagnosis present

## 2023-02-16 DIAGNOSIS — N3289 Other specified disorders of bladder: Secondary | ICD-10-CM | POA: Diagnosis not present

## 2023-02-16 DIAGNOSIS — F17210 Nicotine dependence, cigarettes, uncomplicated: Secondary | ICD-10-CM | POA: Diagnosis not present

## 2023-02-16 NOTE — ED Triage Notes (Signed)
The pt is c/o  abd pain  for 2 days no temp no nausea or vomiting

## 2023-02-17 ENCOUNTER — Emergency Department (HOSPITAL_COMMUNITY): Payer: 59

## 2023-02-17 DIAGNOSIS — N3289 Other specified disorders of bladder: Secondary | ICD-10-CM | POA: Diagnosis not present

## 2023-02-17 LAB — COMPREHENSIVE METABOLIC PANEL
ALT: 28 U/L (ref 0–44)
AST: 18 U/L (ref 15–41)
Albumin: 3.7 g/dL (ref 3.5–5.0)
Alkaline Phosphatase: 84 U/L (ref 38–126)
Anion gap: 11 (ref 5–15)
BUN: 15 mg/dL (ref 8–23)
CO2: 24 mmol/L (ref 22–32)
Calcium: 9.1 mg/dL (ref 8.9–10.3)
Chloride: 104 mmol/L (ref 98–111)
Creatinine, Ser: 0.6 mg/dL (ref 0.44–1.00)
GFR, Estimated: >60 mL/min (ref >60)
Glucose, Bld: 96 mg/dL (ref 70–99)
Potassium: 3.8 mmol/L (ref 3.5–5.1)
Sodium: 139 mmol/L (ref 135–145)
Total Bilirubin: 0.4 mg/dL (ref 0.3–1.2)
Total Protein: 6.9 g/dL (ref 6.5–8.1)

## 2023-02-17 LAB — CBC
HCT: 45.8 % (ref 36.0–46.0)
Hemoglobin: 15.2 g/dL — ABNORMAL HIGH (ref 12.0–15.0)
MCH: 29 pg (ref 26.0–34.0)
MCHC: 33.2 g/dL (ref 30.0–36.0)
MCV: 87.2 fL (ref 80.0–100.0)
Platelets: 248 10*3/uL (ref 150–400)
RBC: 5.25 MIL/uL — ABNORMAL HIGH (ref 3.87–5.11)
RDW: 13.3 % (ref 11.5–15.5)
WBC: 9.5 10*3/uL (ref 4.0–10.5)
nRBC: 0 % (ref 0.0–0.2)

## 2023-02-17 LAB — URINALYSIS, ROUTINE W REFLEX MICROSCOPIC
Bilirubin Urine: NEGATIVE
Glucose, UA: >=500 mg/dL — AB
Hgb urine dipstick: NEGATIVE
Ketones, ur: NEGATIVE mg/dL
Leukocytes,Ua: NEGATIVE
Nitrite: NEGATIVE
Protein, ur: NEGATIVE mg/dL
Specific Gravity, Urine: 1.025 (ref 1.005–1.030)
pH: 6 (ref 5.0–8.0)

## 2023-02-17 LAB — URINALYSIS, MICROSCOPIC (REFLEX)
Bacteria, UA: NONE SEEN
RBC / HPF: NONE SEEN RBC/hpf (ref 0–5)
WBC, UA: NONE SEEN WBC/hpf (ref 0–5)

## 2023-02-17 LAB — LIPASE, BLOOD: Lipase: 34 U/L (ref 11–51)

## 2023-02-17 MED ORDER — HYDROCODONE-ACETAMINOPHEN 5-325 MG PO TABS: 1.0000 | ORAL_TABLET | ORAL | 0 refills | Status: DC | PRN

## 2023-02-17 MED ORDER — IOHEXOL 350 MG/ML SOLN
75.0000 mL | Freq: Once | INTRAVENOUS | Status: AC | PRN
  Administered 2023-02-17: 75 mL via INTRAVENOUS

## 2023-02-17 NOTE — Discharge Instructions (Signed)
The CAT scan today showed that you have some inflammation causing your pain.  Generally this will get better on its own, does not require specific treatment.  You may need additional treatment if the symptoms do not go away on their own.  Please schedule follow-up with your doctor in 1 week or so for a recheck.

## 2023-02-17 NOTE — ED Provider Notes (Signed)
[place] EMERGENCY DEPARTMENT AT [facility] Provider Note   CSN: [id number] Arrival date & time: 02/16/23  2259     History  Chief Complaint  Patient presents with   Abdominal Pain    Colleen Williams is a 67 y.o. female.  Presents to the emergency department for evaluation of bilateral flank pain for 1 day.  Pain has been persistent, unchanging.  Does not change with movement.  Denies injury.  No nausea, vomiting, diarrhea.  Patient denies urinary symptoms, has been drinking water and urinating normally.       Home Medications Prior to Admission medications   Medication Sig Start Date End Date Taking? Authorizing Provider  acetaminophen-codeine (TYLENOL #3) 300-30 MG tablet Take 1 tablet by mouth every 6 (six) hours. 04/29/22   [provider]  Blood Glucose Monitoring Suppl (ACCU-CHEK GUIDE) w/Device KIT See admin instructions. 01/21/22   [provider]  Empagliflozin-metFORMIN HCl (SYNJARDY) 5-500 MG TABS Take 1 tablet by mouth in the morning and at bedtime. 01/27/23   Rocky Morel, DO  glucose blood test strip Use four times daily with insulin as directed. 10/28/22   Evlyn Kanner, MD  glucose monitoring kit (FREESTYLE) monitoring kit 1 each by Does not apply route as needed for other. 01/21/22   Ellison Carwin, MD  hydrochlorothiazide (HYDRODIURIL) 12.5 MG tablet Take 1 tablet (12.5 mg total) by mouth daily. 07/26/22   Evlyn Kanner, MD  Lancets (FREESTYLE) lancets Use as instructed 01/21/22   Ellison Carwin, MD  lidocaine (LIDODERM) 5 % Place 1 patch onto the skin daily. Remove & Discard patch within 12 hours or as directed by MD 10/06/22   Ernie Avena, MD  losartan (COZAAR) 50 MG tablet Take 1 tablet (50 mg total) by mouth daily. 07/26/22   Evlyn Kanner, MD  rosuvastatin (CRESTOR) 20 MG tablet Take 1 tablet (20 mg total) by mouth daily. 01/27/23 01/22/24  Rocky Morel, DO      Allergies    Patient has no known allergies.    Review of  Systems   Review of Systems  Physical Exam Updated Vital Signs BP (!) 109/58   Pulse 90   Temp 97.6 F (36.4 C) (Oral)   Ht 5\' 5"  (1.651 m)   Wt 99.8 kg   SpO2 93%   BMI 36.61 kg/m  Physical Exam Vitals and nursing note reviewed.  Constitutional:      General: She is not in acute distress.    Appearance: She is well-developed.  HENT:     Head: Normocephalic and atraumatic.     Mouth/Throat:     Mouth: Mucous membranes are moist.  Eyes:     General: Vision grossly intact. Gaze aligned appropriately.     Extraocular Movements: Extraocular movements intact.     Conjunctiva/sclera: Conjunctivae normal.  Cardiovascular:     Rate and Rhythm: Normal rate and regular rhythm.     Pulses: Normal pulses.     Heart sounds: Normal heart sounds, S1 normal and S2 normal. No murmur heard.    No friction rub. No gallop.  Pulmonary:     Effort: Pulmonary effort is normal. No respiratory distress.     Breath sounds: Normal breath sounds.  Abdominal:     General: Bowel sounds are normal.     Palpations: Abdomen is soft.     Tenderness: There is no abdominal tenderness. There is right CVA tenderness and left CVA tenderness. There is no guarding or rebound.     Hernia: No  hernia is present.  Musculoskeletal:        General: No swelling.     Cervical back: Full passive range of motion without pain, normal range of motion and neck supple. No spinous process tenderness or muscular tenderness. Normal range of motion.     Right lower leg: No edema.     Left lower leg: No edema.  Skin:    General: Skin is warm and dry.     Capillary Refill: Capillary refill takes less than 2 seconds.     Findings: No ecchymosis, erythema, rash or wound.  Neurological:     General: No focal deficit present.     Mental Status: She is alert and oriented to person, place, and time.     GCS: GCS eye subscore is 4. GCS verbal subscore is 5. GCS motor subscore is 6.     Cranial Nerves: Cranial nerves 2-12 are  intact.     Sensory: Sensation is intact.     Motor: Motor function is intact.     Coordination: Coordination is intact.  Psychiatric:        Attention and Perception: Attention normal.        Mood and Affect: Mood normal.        Speech: Speech normal.        Behavior: Behavior normal.     ED Results / Procedures / Treatments   Labs (all labs ordered are listed, but only abnormal results are displayed) Labs Reviewed  CBC - Abnormal; Notable for the following components:      Result Value   RBC 5.25 (*)    Hemoglobin 15.2 (*)    All other components within normal limits  URINALYSIS, ROUTINE W REFLEX MICROSCOPIC - Abnormal; Notable for the following components:   Glucose, UA >=500 (*)    All other components within normal limits  LIPASE, BLOOD  COMPREHENSIVE METABOLIC PANEL  URINALYSIS, MICROSCOPIC (REFLEX)    EKG None  Radiology CT ABDOMEN PELVIS W CONTRAST  Result Date: 02/17/2023 CLINICAL DATA:  Abdominal pain. EXAM: CT ABDOMEN AND PELVIS WITH CONTRAST TECHNIQUE: Multidetector CT imaging of the abdomen and pelvis was performed using the standard protocol following bolus administration of intravenous contrast. RADIATION DOSE REDUCTION: This exam was performed according to the departmental dose-optimization program which includes automated exposure control, adjustment of the mA and/or kV according to patient size and/or use of iterative reconstruction technique. CONTRAST:  75mL OMNIPAQUE IOHEXOL 350 MG/ML SOLN COMPARISON:  None Available. FINDINGS: Lower chest: Clear lung bases. Hepatobiliary: Subcentimeter hypodensities in the central right hepatic dome are too small to characterize. No gallstones, gallbladder wall thickening, or biliary dilatation. Pancreas: Unremarkable. No pancreatic ductal dilatation or surrounding inflammatory changes. Spleen: Normal in size without focal abnormality. Adrenals/Urinary Tract: No adrenal nodule. No hydronephrosis or perinephric edema. Homogeneous  renal enhancement with symmetric excretion on delayed phase imaging. No renal calculi or focal abnormality. Urinary bladder is only minimally distended without wall thickening. Stomach/Bowel: No bowel obstruction or inflammation. Moderate diffuse colonic stool burden. There is a duodenal diverticulum. Unremarkable appendix. Normal appearance of the stomach. Vascular/Lymphatic: Aortic atherosclerosis. No aortic aneurysm. Scattered small left mesenteric lymph nodes with adjacent mesenteric haziness. No enlarged lymph nodes in the abdomen or pelvis. Reproductive: Endometrial thickening or fluid in the endometrial canal measuring 15 mm, Abner for patient of this age. Quiescent ovaries. No adnexal mass. Other: No ascites. No free intra-abdominal air. No omental thickening. Musculoskeletal: Degenerative disc disease at L4-L5 and L5-S1. Moderate multilevel facet hypertrophy. There  are no acute or suspicious osseous abnormalities. IMPRESSION: 1. Endometrial thickening or fluid in the endometrial canal measuring 15 mm, abnormal for patient of this age. Recommend nonemergent pelvic ultrasound for characterization. 2. Scattered small left mesenteric lymph nodes with adjacent mesenteric haziness, suggesting mesenteric panniculitis. 3. Moderate colonic stool burden, can be seen with constipation. Aortic Atherosclerosis (ICD10-I70.0). Electronically Signed   By: Narda Rutherford M.D.   On: 02/17/2023 03:13    Procedures Procedures    Medications Ordered in ED Medications  iohexol (OMNIPAQUE) 350 MG/ML injection 75 mL (75 mLs Intravenous Contrast Given 02/17/23 0254)    ED Course/ Medical Decision Making/ A&P                             Medical Decision Making Amount and/or Complexity of Data Reviewed Labs: ordered. Decision-making details documented in ED Course. Radiology: ordered and independent interpretation performed. Decision-making details documented in ED Course.  Risk Prescription drug  management.   Differential Diagnosis considered includes, but not limited to: Renal colic/kidney stone; pyelonephritis; aortic dissection; musculoskeletal pain.   Presents to the emergency department for evaluation of bilateral flank pain has been present for 1 day.  Patient's lab work is unremarkable.  Vital signs are unremarkable. CT scan performed.  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  Etiology of mesenteric panniculitis, in general, is unclear.  No initial treatment necessary of then symptomatic medications.  Follow-up with primary care.         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

## 2023-02-18 ENCOUNTER — Encounter: Payer: Self-pay | Admitting: *Deleted

## 2023-02-18 ENCOUNTER — Ambulatory Visit
Admission: RE | Admit: 2023-02-18 | Discharge: 2023-02-18 | Disposition: A | Discharge status: Home / Self Care | Payer: 59 | Source: Ambulatory Visit | Attending: Internal Medicine | Admitting: Internal Medicine

## 2023-02-18 DIAGNOSIS — Z1231 Encounter for screening mammogram for malignant neoplasm of breast: Secondary | ICD-10-CM | POA: Diagnosis not present

## 2023-02-21 ENCOUNTER — Telehealth: Payer: Self-pay

## 2023-02-21 NOTE — Telephone Encounter (Signed)
Transition Care Management Follow-up Telephone Call Date of discharge and from where: 02/17/2023 [facility] How have you been since you were released from the hospital? Patient declined to participate.  Zyairah Wacha [facility]  [facility] Population Health Community Resource Care Guide   [email or web address]  ?? [phone]   Website: [email or web address]  [email or web address]

## 2023-02-21 NOTE — Progress Notes (Signed)
Called and discussed the results of her mammogram which showed no evidence of malignancy.  She was also just recently in the ED for panniculitis and is about to finish a course of antibiotics.  She is doing very well and has no complaints or concerns at this time.  She is scheduled to have an annual wellness visit on 03/12/2023.  Since she was recently seen in the ED I will schedule her for an appointment that day as well.

## 2023-03-01 IMAGING — DX DG CHEST 2V
2 series · 2 of 2 positions shown · non-contrast
Comparison: October 2016

CLINICAL DATA: CP

EXAM:
CHEST - 2 VIEW

[chest pa]
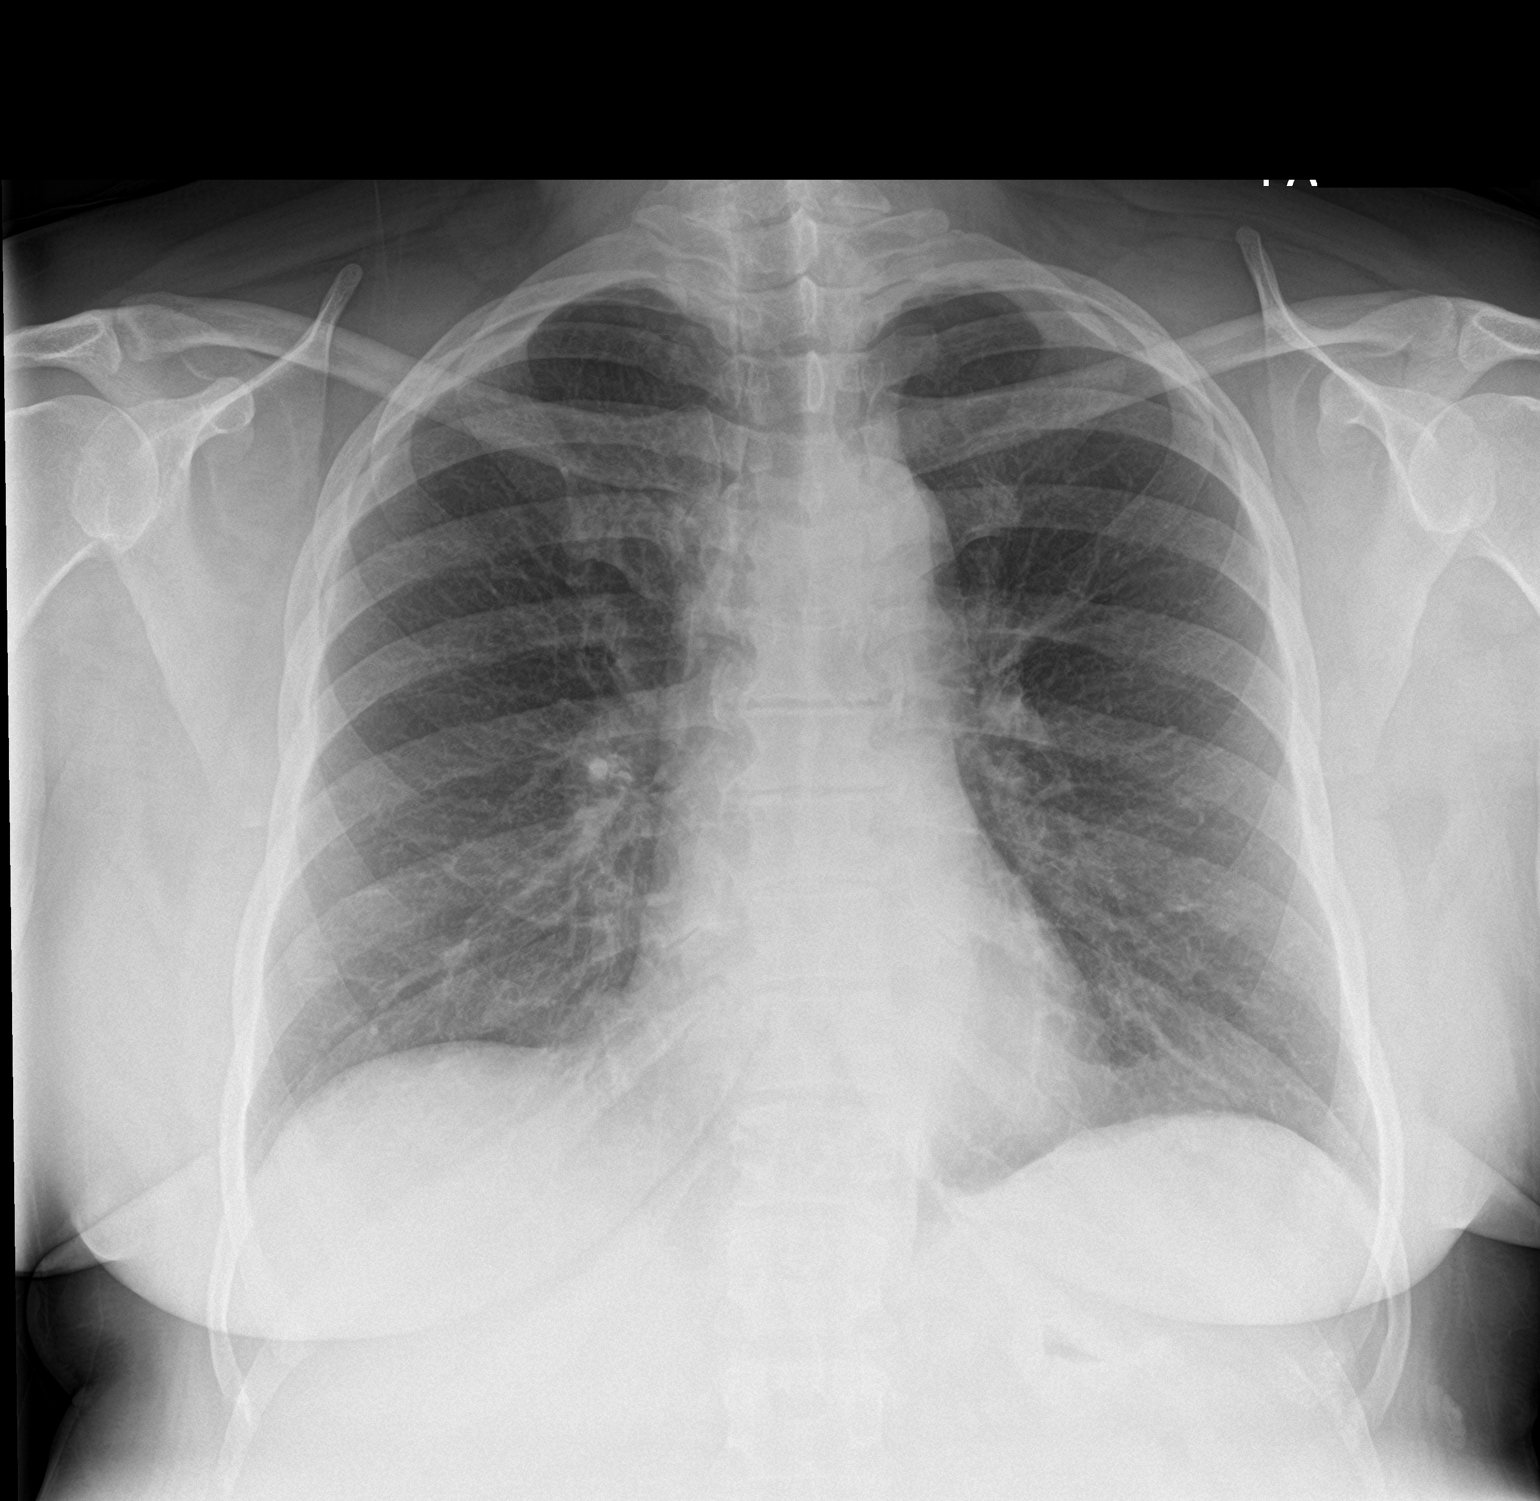

[chest lat]
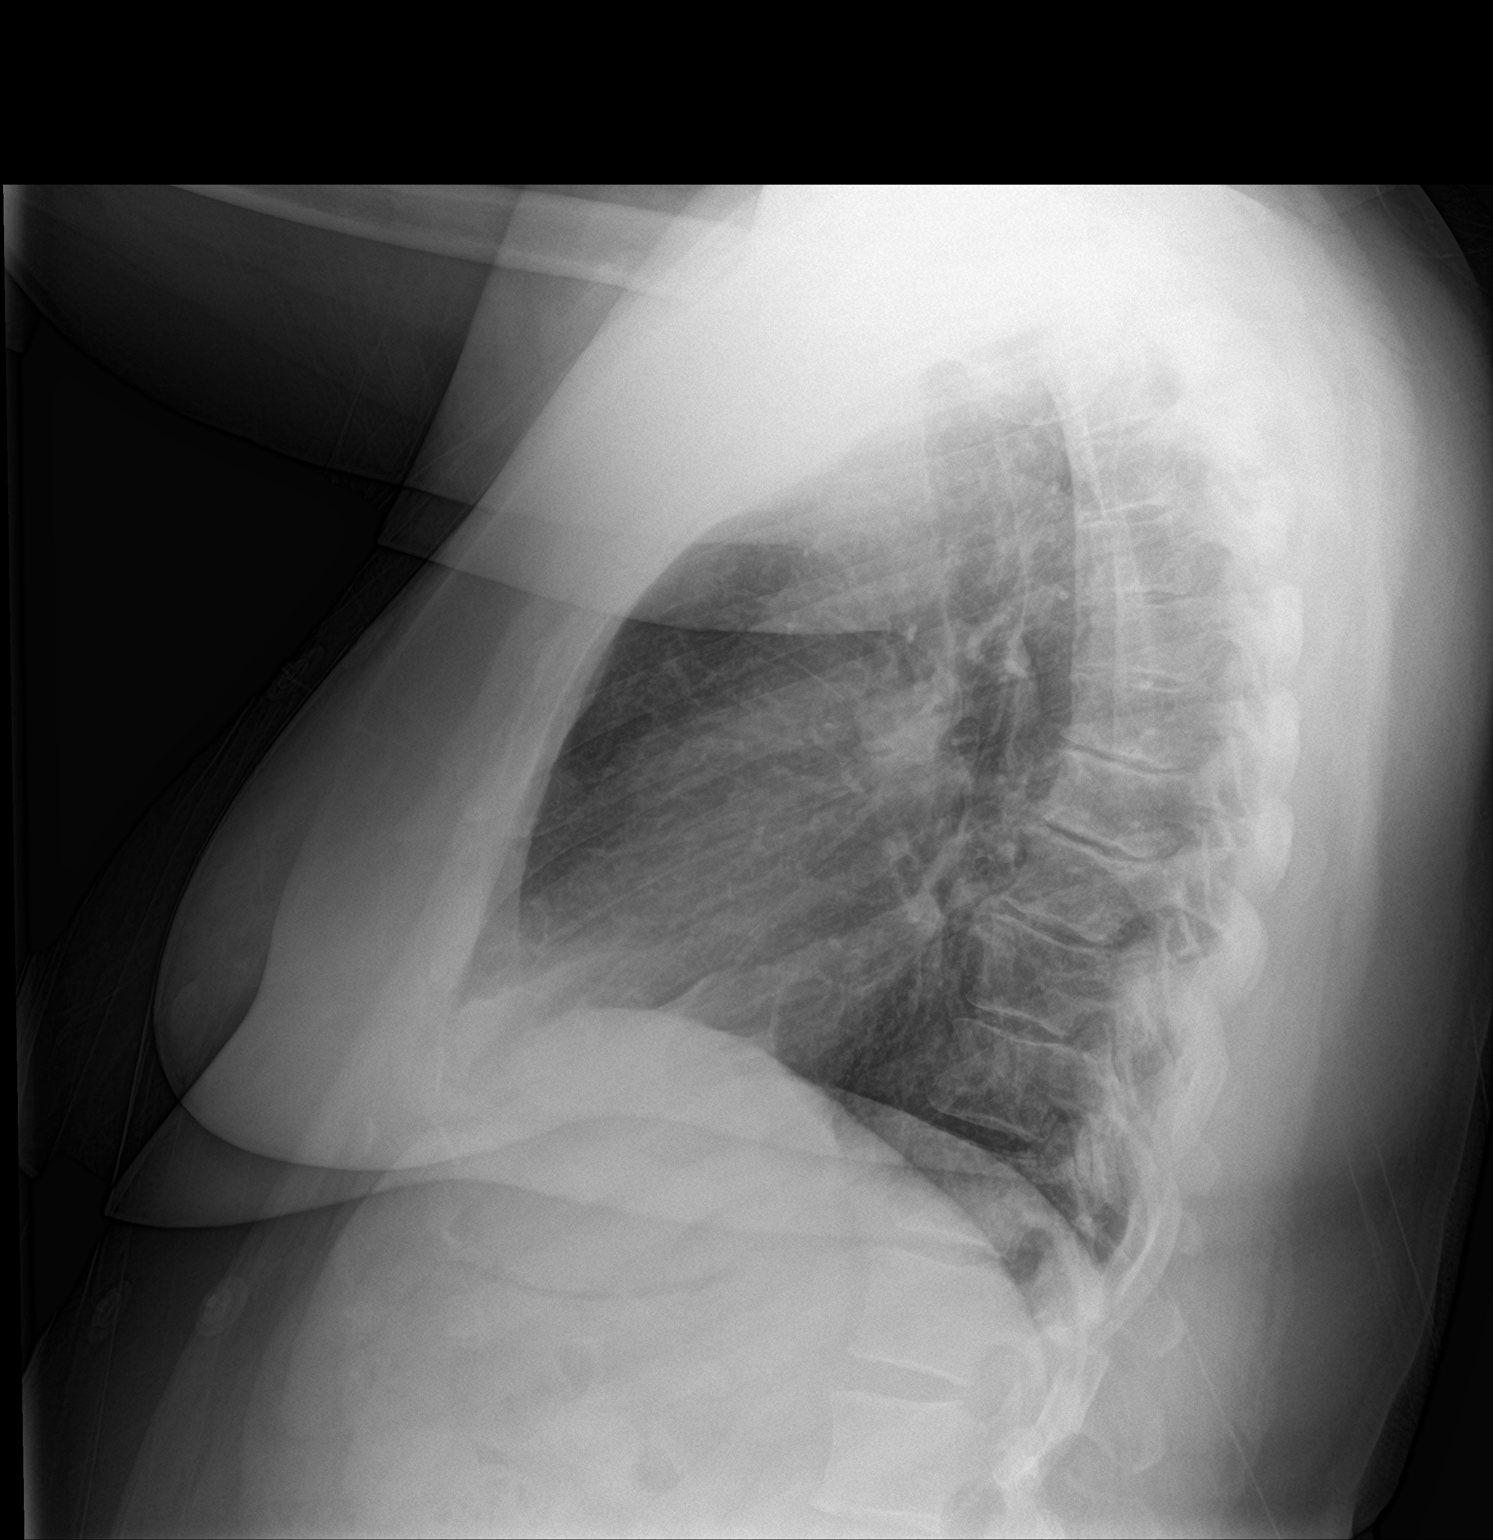

[2 of 2 positions shown; findings below may reference images not displayed]

FINDINGS: The cardiomediastinal silhouette is within normal limits. No pleural
effusion. No pneumothorax. No mass or consolidation. No acute
osseous abnormality.
IMPRESSION: No acute findings in the chest.

## 2023-04-29 ENCOUNTER — Ambulatory Visit (INDEPENDENT_AMBULATORY_CARE_PROVIDER_SITE_OTHER): Payer: 59 | Admitting: *Deleted

## 2023-04-29 ENCOUNTER — Encounter: Payer: Self-pay | Admitting: *Deleted

## 2023-04-29 ENCOUNTER — Ambulatory Visit (INDEPENDENT_AMBULATORY_CARE_PROVIDER_SITE_OTHER): Payer: 59 | Admitting: Student

## 2023-04-29 VITALS — BP 118/76 | HR 91 | Temp 97.7°F | Wt 221.2 lb

## 2023-04-29 VITALS — BP 118/76 | HR 91 | Temp 97.7°F | Wt 221.1 lb

## 2023-04-29 DIAGNOSIS — I10 Essential (primary) hypertension: Secondary | ICD-10-CM

## 2023-04-29 DIAGNOSIS — M545 Low back pain, unspecified: Secondary | ICD-10-CM

## 2023-04-29 DIAGNOSIS — Z7984 Long term (current) use of oral hypoglycemic drugs: Secondary | ICD-10-CM

## 2023-04-29 DIAGNOSIS — Z0000 Encounter for general adult medical examination without abnormal findings: Secondary | ICD-10-CM | POA: Diagnosis not present

## 2023-04-29 DIAGNOSIS — E1169 Type 2 diabetes mellitus with other specified complication: Secondary | ICD-10-CM | POA: Diagnosis not present

## 2023-04-29 LAB — POCT GLYCOSYLATED HEMOGLOBIN (HGB A1C): Hemoglobin A1C: 6.7 % — AB (ref 4.0–5.6)

## 2023-04-29 LAB — GLUCOSE, CAPILLARY: Glucose-Capillary: 174 mg/dL — ABNORMAL HIGH (ref 70–99)

## 2023-04-29 MED ORDER — EMPAGLIFLOZIN 10 MG PO TABS: 10.0000 mg | ORAL_TABLET | Freq: Every day | ORAL | 3 refills | Status: DC

## 2023-04-29 NOTE — Assessment & Plan Note (Signed)
Stable. Continue Losartan and HCTZ. 

## 2023-04-29 NOTE — Progress Notes (Signed)
Subjective:   Colleen Williams is a 67 y.o. female who presents for Medicare Annual (Subsequent) preventive examination.  Visit Complete: In person  Patient Medicare AWV questionnaire was completed by the patient on 04/29/2023; I have confirmed that all information answered by patient is correct and no changes since this date.  Review of Systems    Defer to pcp       Objective:    Today's Vitals   04/29/23 0830  BP: 118/76  Pulse: 91  Temp: 97.7 F (36.5 C)  TempSrc: Oral  SpO2: 99%  Weight: 221 lb 1.9 oz (100.3 kg)   Body mass index is 36.8 kg/m.     04/29/2023    1:58 PM 04/29/2023    8:38 AM 02/16/2023   11:22 PM 10/28/2022    9:40 AM 07/24/2022    8:14 AM 04/24/2022    9:47 AM 04/24/2022    8:32 AM  Advanced Directives  Does Patient Have a Medical Advance Directive? No No No No No No No  Would patient like information on creating a medical advance directive? No - Patient declined No - Patient declined  No - Patient declined No - Patient declined No - Patient declined No - Patient declined    Current Medications (verified) Outpatient Encounter Medications as of 04/29/2023  Medication Sig   acetaminophen-codeine (TYLENOL #3) 300-30 MG tablet Take 1 tablet by mouth every 6 (six) hours.   Blood Glucose Monitoring Suppl (ACCU-CHEK GUIDE) w/Device KIT See admin instructions.   Empagliflozin-metFORMIN HCl (SYNJARDY) 5-500 MG TABS Take 1 tablet by mouth in the morning and at bedtime.   glucose blood test strip Use four times daily with insulin as directed.   glucose monitoring kit (FREESTYLE) monitoring kit 1 each by Does not apply route as needed for other.   hydrochlorothiazide (HYDRODIURIL) 12.5 MG tablet Take 1 tablet (12.5 mg total) by mouth daily.   HYDROcodone-acetaminophen (NORCO/VICODIN) 5-325 MG tablet Take 1 tablet by mouth every 4 (four) hours as needed for moderate pain.   Lancets (FREESTYLE) lancets Use as instructed   lidocaine (LIDODERM) 5 % Place 1 patch onto  the skin daily. Remove & Discard patch within 12 hours or as directed by MD   losartan (COZAAR) 50 MG tablet Take 1 tablet (50 mg total) by mouth daily.   rosuvastatin (CRESTOR) 20 MG tablet Take 1 tablet (20 mg total) by mouth daily.   No facility-administered encounter medications on file as of 04/29/2023.    Allergies (verified) Patient has no known allergies.   History: Past Medical History:  Diagnosis Date   Arthritis    Diabetes mellitus without complication (HCC)    Hyperlipidemia    Hypertension    Vitreous floaters of both eyes 08/03/2017   Past Surgical History:  Procedure Laterality Date   COLONOSCOPY     TUBAL LIGATION     Family History  Problem Relation Age of Onset   Diabetes Mother    Colon cancer Neg Hx    Colon polyps Neg Hx    Esophageal cancer Neg Hx    Stomach cancer Neg Hx    Rectal cancer Neg Hx    Breast cancer Neg Hx    Social History   Socioeconomic History   Marital status: Single    Spouse name: Not on file   Number of children: Not on file   Years of education: Not on file   Highest education level: Not on file  Occupational History   Not on  file  Tobacco Use   Smoking status: Former    Current packs/day: 0.00    Types: Cigarettes    Quit date: 09/16/2008    Years since quitting: 14.6   Smokeless tobacco: Never  Vaping Use   Vaping status: Never Used  Substance and Sexual Activity   Alcohol use: Yes    Alcohol/week: 0.0 standard drinks of alcohol    Comment: Occasionally wine.   Drug use: No   Sexual activity: Not Currently    Birth control/protection: Surgical  Other Topics Concern   Not on file  Social History Narrative   Not on file   Social Determinants of Health   Financial Resource Strain: Low Risk  (04/24/2022)   Overall Financial Resource Strain (CARDIA)    Difficulty of Paying Living Expenses: Not hard at all  Food Insecurity: No Food Insecurity (04/24/2022)   Hunger Vital Sign    Worried About Running Out of Food  in the Last Year: Never true    Ran Out of Food in the Last Year: Never true  Transportation Needs: No Transportation Needs (04/24/2022)   PRAPARE - Administrator, Civil Service (Medical): No    Lack of Transportation (Non-Medical): No  Physical Activity: Insufficiently Active (07/24/2022)   Exercise Vital Sign    Days of Exercise per Week: 3 days    Minutes of Exercise per Session: 30 min  Stress: No Stress Concern Present (04/24/2022)   [employer] of Occupational Health - Occupational Stress Questionnaire    Feeling of Stress : Not at all  Social Connections: Moderately Integrated (04/24/2022)   Social Connection and Isolation Panel [NHANES]    Frequency of Communication with Friends and Family: Twice a week    Frequency of Social Gatherings with Friends and Family: Twice a week    Attends Religious Services: More than 4 times per year    Active Member of [employer] or Organizations: Yes    Attends Engineer, structural: More than 4 times per year    Marital Status: Never married    Tobacco Counseling Counseling given: Not Answered   Clinical Intake:  Pre-visit preparation completed: Yes  Pain : Faces Faces Pain Scale: Hurts little more Pain Type: Acute pain Pain Location: Back Pain Orientation: Lower  Faces Pain Scale: Hurts little more     How often do you need to have someone help you when you read instructions, pamphlets, or other written materials from your doctor or pharmacy?: 1 - Never  Interpreter Needed?: No  Information entered by :: [email or web address]   Activities of Daily Living    04/29/2023    8:40 AM 01/27/2023    8:24 AM  In your present state of health, do you have any difficulty performing the following activities:  Hearing? 0 0  Vision? 0 0  Difficulty concentrating or making decisions? 0 0  Walking or climbing stairs? 0 0  Dressing or bathing? 0 0  Doing errands, shopping? 0 0    Patient Care Team: Philomena Doheny,  MD as PCP - General  Indicate any recent Medical Services you may have received from other than [facility] providers in the past year (date may be approximate).     Assessment:   This is a routine wellness examination for [name].  Hearing/Vision screen No results found.  Dietary issues and exercise activities discussed:     Goals Addressed   None   Depression Screen    01/27/2023    9:06 AM 10/28/2022  9:40 AM 07/24/2022    8:14 AM 04/24/2022    9:47 AM 04/24/2022    8:57 AM 04/24/2022    8:32 AM 01/21/2022    8:35 AM  PHQ 2/9 Scores  PHQ - 2 Score 0 0 0 0 0 0 0  PHQ- 9 Score  0         Fall Risk    04/29/2023    8:39 AM 01/27/2023    8:24 AM 10/28/2022    9:40 AM 07/24/2022    8:14 AM 04/24/2022    9:47 AM  Fall Risk   Falls in the past year? 0 0  0 0  Number falls in past yr: 0 0  0 0  Injury with Fall? 0 0  0 0  Risk for fall due to : No Fall Risks No Fall Risks No Fall Risks No Fall Risks No Fall Risks  Follow up Falls evaluation completed Falls evaluation completed Falls evaluation completed Falls evaluation completed;Falls prevention discussed Falls evaluation completed;Falls prevention discussed    MEDICARE RISK AT HOME:   TIMED UP AND GO:  Was the test performed?  No    Cognitive Function:        04/24/2022    9:00 AM  6CIT Screen  What Year? 0 points  What month? 0 points  Count back from 20 0 points  Months in reverse 0 points  Repeat phrase 0 points    Immunizations Immunization History  Administered Date(s) Administered   Moderna Sars-Covid-2 Vaccination 10/18/2019, 11/15/2019   Tdap 01/29/2018    TDAP status: Up to date  Flu Vaccine status: Up to date  Pneumococcal vaccine status: Declined,  Education has been provided regarding the importance of this vaccine but patient still declined. Advised may receive this vaccine at local pharmacy or Health Dept. Aware to provide a copy of the vaccination record if obtained from local pharmacy or Health Dept.  Verbalized acceptance and understanding.   Covid-19 vaccine status: Completed vaccines  Qualifies for Shingles Vaccine? Yes   Zostavax completed No   Shingrix Completed?: No.    Education has been provided regarding the importance of this vaccine. Patient has been advised to call insurance company to determine out of pocket expense if they have not yet received this vaccine. Advised may also receive vaccine at local pharmacy or Health Dept. Verbalized acceptance and understanding.  Screening Tests Health Maintenance  Topic Date Due   Zoster Vaccines- Shingrix (1 of 2) Never done   COVID-19 Vaccine (3 - 2023-24 season) 05/17/2022   INFLUENZA VACCINE  04/17/2023   Pneumonia Vaccine 66+ Years old (1 of 1 - PCV) 10/29/2023 (Originally 06/16/2021)   DEXA SCAN  10/29/2023 (Originally 06/16/2021)   Diabetic kidney evaluation - Urine ACR  07/25/2023   FOOT EXAM  07/25/2023   HEMOGLOBIN A1C  07/30/2023   OPHTHALMOLOGY EXAM  11/05/2023   Diabetic kidney evaluation - eGFR measurement  02/16/2024   Medicare Annual Wellness (AWV)  04/28/2024   MAMMOGRAM  02/17/2025   DTaP/Tdap/Td (2 - Td or Tdap) 01/30/2028   Colonoscopy  07/01/2032   Hepatitis C Screening  Completed   HPV VACCINES  Aged Out    Health Maintenance  Health Maintenance Due  Topic Date Due   Zoster Vaccines- Shingrix (1 of 2) Never done   COVID-19 Vaccine (3 - 2023-24 season) 05/17/2022   INFLUENZA VACCINE  04/17/2023    Colorectal cancer screening: Type of screening: Colonoscopy. Completed 07/01/2022. Repeat every 10 years  Mammogram status:  Completed 02/18/2023. Repeat every year  Bone Density status: overdue.  Pt provided with contact info and advised to call to schedule appt.  Lung Cancer Screening: (Low Dose CT Chest recommended if Age 5-80 years, 20 pack-year currently smoking OR have quit w/in 15years.) does not qualify.   Lung Cancer Screening Referral: n/a  Additional Screening:  Hepatitis C Screening: does  not qualify; Completed 06/12/2015  Vision Screening: Recommended annual ophthalmology exams for early detection of glaucoma and other disorders of the eye. Is the patient up to date with their annual eye exam? Who is the provider or what is the name of the office in which the patient attends annual eye exams? unknown If pt is not established with a provider, would they like to be referred to a provider to establish care?  NO .   Dental Screening: Recommended annual dental exams for proper oral hygiene  Diabetic Foot Exam: Diabetic Foot Exam: Completed 07/24/2022  Community Resource Referral / Chronic Care Management: CRR required this visit?  No   CCM required this visit?  No     Plan:     I have personally reviewed and noted the following in the patient's chart:   Medical and social history Use of alcohol, tobacco or illicit drugs  Current medications and supplements including opioid prescriptions. Patient is not currently taking opioid prescriptions. Functional ability and status Nutritional status Physical activity Advanced directives List of other physicians Hospitalizations, surgeries, and ER visits in previous 12 months Vitals Screenings to include cognitive, depression, and falls Referrals and appointments  In addition, I have reviewed and discussed with patient certain preventive protocols, quality metrics, and best practice recommendations. A written personalized care plan for preventive services as well as general preventive health recommendations were provided to patient.     Joslyn Devon, [place]   04/29/2023   After Visit Summary: (Mail)  the after visit summary with patients personalized plan was offered to patient via mail   Nurse Notes: face to face  Ms. Pernell Dupre , Thank you for taking time to come for your Medicare Wellness Visit. I appreciate your ongoing commitment to your health goals. Please review the following plan we discussed and let me  know if I can assist you in the future.   These are the goals we discussed:  Goals   None     This is a list of the screening recommended for you and due dates:  Health Maintenance  Topic Date Due   Zoster (Shingles) Vaccine (1 of 2) Never done   COVID-19 Vaccine (3 - 2023-24 season) 05/17/2022   Flu Shot  04/17/2023   Pneumonia Vaccine (1 of 1 - PCV) 10/29/2023*   DEXA scan (bone density measurement)  10/29/2023*   Yearly kidney health urinalysis for diabetes  07/25/2023   Complete foot exam   07/25/2023   Hemoglobin A1C  07/30/2023   Eye exam for diabetics  11/05/2023   Yearly kidney function blood test for diabetes  02/16/2024   Medicare Annual Wellness Visit  04/28/2024   Mammogram  02/17/2025   DTaP/Tdap/Td vaccine (2 - Td or Tdap) 01/30/2028   Colon Cancer Screening  07/01/2032   Hepatitis C Screening  Completed   HPV Vaccine  Aged Out  *Topic was postponed. The date shown is not the original due date.

## 2023-04-29 NOTE — Assessment & Plan Note (Addendum)
A1c is 6.7 today (up from 6.5 in May). Managed with Synjardy 5-500 mg/daily. Patient adamantly states that Colleen Williams is causing back pain (see below). Patient believes Metformin component is what is causing back pain. Thankfully, T2DM is well-controlled. Patient counseled on the importance of diet and exercise. - Discontinue Synjardy  - Jardiance 10 mg/daily ordered - Repeat A1c in 3 months

## 2023-04-29 NOTE — Assessment & Plan Note (Signed)
>>  ASSESSMENT AND PLAN FOR BACK PAIN WRITTEN ON 04/29/2023  4:47 PM BY MARYLU GEE, DO  Patient endorses right-sided thoracic back pain that began a few months ago. Patient states pain is moderate, non-radiating, and without numbness/weakness. Patient insistently states that Synjardy  is causing back pain. Notably, Synjardy  was first started May 2023. Patient states she had similar back pain with Metformin  monotherapy in the past. Physical exam unremarkable. Patient denies urinary frequency, urgency, dysuria, or hematuria. Per chart review, patient has a history of lumbar radiculopathy and X-rays in 2011 show L4-L5 disc degeneration. There was also a 2021 office visit in which patient expressed concerns that Losartan  was causing back pain. Unlikely that Synjardy  is causing back pain, but given that T2DM is well-controlled, will discontinue Synjardy  and begin Jardiance  monotherapy as noted above. -Consider alternate cause if symptoms do not improve

## 2023-04-29 NOTE — Assessment & Plan Note (Addendum)
Patient endorses right-sided thoracic back pain that began a few months ago. Patient states pain is moderate, non-radiating, and without numbness/weakness. Patient insistently states that [name] is causing back pain. Notably, [name] was first started May 2023. Patient states she had similar back pain with Metformin monotherapy in the past. Physical exam unremarkable. Patient denies urinary frequency, urgency, dysuria, or hematuria. Per chart review, patient has a history of lumbar radiculopathy and X-rays in 2011 show L4-L5 disc degeneration. There was also a 2021 office visit in which patient expressed concerns that Losartan was causing back pain. Unlikely that [name] is causing back pain, but given that T2DM is well-controlled, will discontinue Synjardy and begin Jardiance monotherapy as noted above. -Consider alternate cause if symptoms do not improve

## 2023-04-29 NOTE — Patient Instructions (Signed)
Thank you for allowing me to be a part of your care team. Today we discussed a few things:  Please STOP taking the Synjardy, and I will send in a new prescription for a medication that does not contain Metformin. Keep up the great work with your diet/exercise. Send a MyChart message/give [place] a call in a week or two if the pain does not improve. Otherwise we will follow-up in 3 months.

## 2023-04-29 NOTE — Patient Instructions (Signed)

## 2023-04-29 NOTE — Progress Notes (Signed)
CC: AWV, follow-up, back pain  HPI:  [name] is a 67 y.o. female living with a history stated below and presents today for a follow-up and back pain. Please see problem based assessment and plan for additional details.  Past Medical History:  Diagnosis Date   Arthritis    Diabetes mellitus without complication (HCC)    Hyperlipidemia    Hypertension    Vitreous floaters of both eyes 08/03/2017    Current Outpatient Medications on File Prior to Visit  Medication Sig Dispense Refill   acetaminophen-codeine (TYLENOL #3) 300-30 MG tablet Take 1 tablet by mouth every 6 (six) hours.     Blood Glucose Monitoring Suppl (ACCU-CHEK GUIDE) w/Device KIT See admin instructions.     glucose blood test strip Use four times daily with insulin as directed. 200 each 5   glucose monitoring kit (FREESTYLE) monitoring kit 1 each by Does not apply route as needed for other. 1 each 1   hydrochlorothiazide (HYDRODIURIL) 12.5 MG tablet Take 1 tablet (12.5 mg total) by mouth daily. 90 tablet 3   HYDROcodone-acetaminophen (NORCO/VICODIN) 5-325 MG tablet Take 1 tablet by mouth every 4 (four) hours as needed for moderate pain. 10 tablet 0   Lancets (FREESTYLE) lancets Use as instructed 100 each 12   lidocaine (LIDODERM) 5 % Place 1 patch onto the skin daily. Remove & Discard patch within 12 hours or as directed by MD 30 patch 0   losartan (COZAAR) 50 MG tablet Take 1 tablet (50 mg total) by mouth daily. 90 tablet 3   rosuvastatin (CRESTOR) 20 MG tablet Take 1 tablet (20 mg total) by mouth daily. 90 tablet 3   No current facility-administered medications on file prior to visit.    Family History  Problem Relation Age of Onset   Diabetes Mother    Colon cancer Neg Hx    Colon polyps Neg Hx    Esophageal cancer Neg Hx    Stomach cancer Neg Hx    Rectal cancer Neg Hx    Breast cancer Neg Hx     Social History   Socioeconomic History   Marital status: Single    Spouse name: Not on file    Number of children: Not on file   Years of education: Not on file   Highest education level: Not on file  Occupational History   Not on file  Tobacco Use   Smoking status: Former    Current packs/day: 0.00    Types: Cigarettes    Quit date: 09/16/2008    Years since quitting: 14.6   Smokeless tobacco: Never  Vaping Use   Vaping status: Never Used  Substance and Sexual Activity   Alcohol use: Yes    Alcohol/week: 0.0 standard drinks of alcohol    Comment: Occasionally wine.   Drug use: No   Sexual activity: Not Currently    Birth control/protection: Surgical  Other Topics Concern   Not on file  Social History Narrative   Not on file   Social Determinants of Health   Financial Resource Strain: Low Risk  (04/24/2022)   Overall Financial Resource Strain (CARDIA)    Difficulty of Paying Living Expenses: Not hard at all  Food Insecurity: No Food Insecurity (04/29/2023)   Hunger Vital Sign    Worried About Running Out of Food in the Last Year: Never true    Ran Out of Food in the Last Year: Never true  Transportation Needs: No Transportation Needs (04/24/2022)   PRAPARE -  Administrator, Civil Service (Medical): No    Lack of Transportation (Non-Medical): No  Physical Activity: Insufficiently Active (07/24/2022)   Exercise Vital Sign    Days of Exercise per Week: 3 days    Minutes of Exercise per Session: 30 min  Stress: No Stress Concern Present (04/24/2022)   Colleen Williams of Occupational Health - Occupational Stress Questionnaire    Feeling of Stress : Not at all  Social Connections: Moderately Integrated (04/24/2022)   Social Connection and Isolation Panel [NHANES]    Frequency of Communication with Friends and Family: Twice a week    Frequency of Social Gatherings with Friends and Family: Twice a week    Attends Religious Services: More than 4 times per year    Active Member of [employer] or Organizations: Yes    Attends Engineer, structural: More than 4 times  per year    Marital Status: Never married  Intimate Partner Violence: Not At Risk (04/29/2023)   Humiliation, Afraid, Rape, and Kick questionnaire    Fear of Current or Ex-Partner: No    Emotionally Abused: No    Physically Abused: No    Sexually Abused: No    Review of Systems: ROS negative except for what is noted on the assessment and plan.  Vitals:   04/29/23 0830  BP: 118/76  Pulse: 91  Temp: 97.7 F (36.5 C)  TempSrc: Oral  SpO2: 99%  Weight: 221 lb 3.2 oz (100.3 kg)    Physical Exam: Constitutional: obese, sitting in chair, in no acute distress Cardiovascular: regular rate and rhythm, no m/r/g Pulmonary/Chest: normal work of breathing on room air, lungs clear to auscultation bilaterally Abdominal: soft, non-tender, Lloyd's test negative MSK: U/LE strength 5/5 bilaterally, straight leg raise negative bilaterally, no spinous process or paraspinal tenderness Skin: warm and dry Psych: normal mood and behavior  Assessment & Plan:     Patient seen with Dr. Lafonda Mosses  Essential hypertension Stable. -Continue Losartan and HCTZ  Type 2 diabetes mellitus (HCC) A1c is 6.7 today (up from 6.5 in May). Managed with Synjardy 5-500 mg/daily. Patient adamantly states that Colleen Williams is causing back pain (see below). Patient believes Metformin component is what is causing back pain. Thankfully, T2DM is well-controlled. Patient counseled on the importance of diet and exercise. - Discontinue Synjardy  - Jardiance 10 mg/daily ordered - Repeat A1c in 3 months  Back pain Patient endorses right-sided thoracic back pain that began a few months ago. Patient states pain is moderate, non-radiating, and without numbness/weakness. Patient insistently states that Colleen Williams is causing back pain. Notably, Colleen Williams was first started May 2023. Patient states she had similar back pain with Metformin monotherapy in the past. Physical exam unremarkable. Patient denies urinary frequency, urgency, dysuria,  or hematuria. Per chart review, patient has a history of lumbar radiculopathy and X-rays in 2011 show L4-L5 disc degeneration. There was also a 2021 office visit in which patient expressed concerns that Losartan was causing back pain. Unlikely that Colleen Williams is causing back pain, but given that T2DM is well-controlled, will discontinue Synjardy and begin Jardiance monotherapy as noted above. -Consider alternate cause if symptoms do not improve   Carmina Miller, D.O. [facility], PGY-1 Phone: [phone] Date 04/29/2023 Time 4:48 PM

## 2023-04-30 NOTE — Progress Notes (Signed)
Internal Medicine Clinic Attending  I was physically present during the key portions of the resident provided service and participated in the medical decision making of patient's management care. I reviewed pertinent patient test results.  The assessment, diagnosis, and plan were formulated together and I agree with the documentation in the resident's note.  Mercie Eon, MD   I agree with Dr Annie Paras that I'm not convinced the Metformin is the culprit for the back pain. However, patient requests that we stop this medicine, so we have stopped it. We will see if her back pain improves. If not, I would recommend physical therapy. My suspicion for a compression fracture is very low given the lateral location.   I discussed how GLP1 may be needed if A1C not at goal of <7 off the Metformin.

## 2023-05-06 ENCOUNTER — Other Ambulatory Visit: Payer: Self-pay

## 2023-05-06 DIAGNOSIS — E119 Type 2 diabetes mellitus without complications: Secondary | ICD-10-CM

## 2023-05-06 MED ORDER — FREESTYLE LANCETS MISC: 12 refills | Status: DC

## 2023-05-07 ENCOUNTER — Telehealth: Payer: Self-pay

## 2023-05-07 NOTE — Telephone Encounter (Signed)
Patient called she stated she has been switched to freestyle, she is no longer using accu check. Patient stated she needs pens. Please return patients call.

## 2023-05-13 ENCOUNTER — Telehealth: Payer: Self-pay

## 2023-05-13 NOTE — Telephone Encounter (Signed)
Pt states she stopped taking empagliflozin (JARDIANCE) 10 MG TABS tablet, medication cause her to have back and sides pain. Requesting to speak with a nurse, please call pt back.

## 2023-05-13 NOTE — Telephone Encounter (Signed)
Called the patient regarding back pain she was having.  She states that she thinks is related to her Jardiance.  She states that she stopped taking this. She did just have an A1c at 6.7.  This is at goal.  She states she will eat right and exercise. I stated we can try her off medications as her A1c is at goal. Would need to start back medication if A1c is not at goal at next visit.  Advised patient to exercise and diet properly.  She agreed to this.  Will stop Jardiance.  I did offer metformin, though she states he stated she would not be able to tolerate that either and she declined this.

## 2023-06-12 DIAGNOSIS — H401231 Low-tension glaucoma, bilateral, mild stage: Secondary | ICD-10-CM | POA: Diagnosis not present

## 2023-06-12 DIAGNOSIS — H524 Presbyopia: Secondary | ICD-10-CM | POA: Diagnosis not present

## 2023-06-12 DIAGNOSIS — H25813 Combined forms of age-related cataract, bilateral: Secondary | ICD-10-CM | POA: Diagnosis not present

## 2023-06-12 DIAGNOSIS — E119 Type 2 diabetes mellitus without complications: Secondary | ICD-10-CM | POA: Diagnosis not present

## 2023-06-12 LAB — HM DIABETES EYE EXAM

## 2023-06-24 ENCOUNTER — Ambulatory Visit
Admission: EM | Admit: 2023-06-24 | Discharge: 2023-06-24 | Disposition: A | Discharge status: Home / Self Care | Payer: 59

## 2023-06-24 DIAGNOSIS — R109 Unspecified abdominal pain: Secondary | ICD-10-CM

## 2023-06-24 LAB — POCT FASTING CBG KUC MANUAL ENTRY: POCT Glucose (KUC): 118 mg/dL — AB (ref 70–99)

## 2023-06-24 LAB — POCT URINALYSIS DIP (MANUAL ENTRY)
Bilirubin, UA: NEGATIVE
Glucose, UA: 500 mg/dL — AB
Ketones, POC UA: NEGATIVE mg/dL
Leukocytes, UA: NEGATIVE
Nitrite, UA: NEGATIVE
Protein Ur, POC: NEGATIVE mg/dL
Spec Grav, UA: 1.01 (ref 1.010–1.025)
Urobilinogen, UA: 1 U/dL
pH, UA: 6 (ref 5.0–8.0)

## 2023-06-24 NOTE — ED Triage Notes (Signed)
"  This back pain has been for about 2 days now, they changed my medication to Jardiance". The pain is right lower back "pant line" and some on lower right side of abd area. No dysuria. Stools "normal". No Fever.

## 2023-06-24 NOTE — ED Provider Notes (Signed)
[facility]    CSN: [id number] Arrival date & time: 06/24/23  1531      History   Chief Complaint Chief Complaint  Patient presents with   Back Pain    HPI Colleen Williams is a 67 y.o. female.   Patient here today for evaluation of back pain to her right low side that radiates around her right lower abdomen that is been present for 2 days.  She states that she was changed to [place] is not sure if this has anything to do with symptoms.  She notes she has been taking medication for several months.  She did discontinue medication 2 days ago.  She denies any dysuria or hematuria.  She notes that her stools are normal.  She has not had any fever.  The history is provided by the patient.  Back Pain Associated symptoms: abdominal pain   Associated symptoms: no dysuria and no fever     Past Medical History:  Diagnosis Date   Arthritis    Diabetes mellitus without complication (HCC)    Hyperlipidemia    Hypertension    Vitreous floaters of both eyes 08/03/2017    Patient Active Problem List   Diagnosis Date Noted   Morbid obesity (HCC) 04/24/2022   Back pain 03/15/2020   Hyperlipidemia associated with type 2 diabetes mellitus (HCC) 11/12/2019   Essential hypertension 08/03/2017   Type 2 diabetes mellitus (HCC) 01/30/2017   Osteoarthritis of ankle and foot 07/30/2016   Healthcare maintenance 06/12/2015    Past Surgical History:  Procedure Laterality Date   COLONOSCOPY     TUBAL LIGATION      OB History     Gravida  2   Para      Term      Preterm      AB      Living  2      SAB      IAB      Ectopic      Multiple      Live Births  2            Home Medications    Prior to Admission medications   Medication Sig Start Date End Date Taking? Authorizing Provider  hydrochlorothiazide (HYDRODIURIL) 12.5 MG tablet Take 1 tablet (12.5 mg total) by mouth daily. 07/26/22  Yes Evlyn Kanner, MD  JARDIANCE 10 MG TABS tablet Take  10 mg by mouth daily. 06/19/23  Yes [provider]  losartan (COZAAR) 50 MG tablet Take 1 tablet (50 mg total) by mouth daily. 07/26/22  Yes Evlyn Kanner, MD  rosuvastatin (CRESTOR) 20 MG tablet Take 1 tablet (20 mg total) by mouth daily. 01/27/23 01/22/24 Yes Rocky Morel, DO  acetaminophen-codeine (TYLENOL #3) 300-30 MG tablet Take 1 tablet by mouth every 6 (six) hours. 04/29/22   [provider]  AREXVY 120 MCG/0.5ML injection Inject 0.5 mLs into the muscle once. 03/04/23   [provider]  Blood Glucose Monitoring Suppl (ACCU-CHEK GUIDE) w/Device KIT See admin instructions. 01/21/22   [provider]  glucose blood test strip Use four times daily with insulin as directed. 10/28/22   Evlyn Kanner, MD  glucose monitoring kit (FREESTYLE) monitoring kit 1 each by Does not apply route as needed for other. 01/21/22   Ellison Carwin, MD  HYDROcodone-acetaminophen (NORCO/VICODIN) 5-325 MG tablet Take 1 tablet by mouth every 4 (four) hours as needed for moderate pain. 02/17/23   Gilda Crease, MD  Lancets (FREESTYLE) lancets Use  as instructed 05/06/23   Hassan Rowan, [place], MD  latanoprost (XALATAN) 0.005 % ophthalmic solution Place 1 drop into both eyes at bedtime. 06/14/23   [provider]  lidocaine (LIDODERM) 5 % Place 1 patch onto the skin daily. Remove & Discard patch within 12 hours or as directed by MD 10/06/22   Ernie Avena, MD    Family History Family History  Problem Relation Age of Onset   Diabetes Mother    Colon cancer Neg Hx    Colon polyps Neg Hx    Esophageal cancer Neg Hx    Stomach cancer Neg Hx    Rectal cancer Neg Hx    Breast cancer Neg Hx     Social History Social History   Tobacco Use   Smoking status: Former    Current packs/day: 0.00    Types: Cigarettes    Quit date: 09/16/2008    Years since quitting: 14.7   Smokeless tobacco: Never  Vaping Use   Vaping status: Never Used  Substance Use Topics    Alcohol use: Yes    Alcohol/week: 0.0 standard drinks of alcohol    Comment: Occasionally wine.   Drug use: No     Allergies   Patient has no known allergies.   Review of Systems Review of Systems  Constitutional:  Negative for chills and fever.  Eyes:  Negative for discharge and redness.  Respiratory:  Negative for shortness of breath.   Gastrointestinal:  Positive for abdominal pain. Negative for blood in stool, diarrhea, nausea and vomiting.  Genitourinary:  Negative for dysuria and hematuria.  Musculoskeletal:  Positive for back pain.     Physical Exam Triage Vital Signs ED Triage Vitals  Encounter Vitals Group     BP 06/24/23 1636 131/84     Systolic BP Percentile --      Diastolic BP Percentile --      Pulse Rate 06/24/23 1636 92     Resp 06/24/23 1636 18     Temp 06/24/23 1636 97.7 F (36.5 C)     Temp Source 06/24/23 1636 Oral     SpO2 06/24/23 1636 96 %     Weight 06/24/23 1633 221 lb 1.9 oz (100.3 kg)     Height 06/24/23 1633 5\' 5"  (1.651 m)     Head Circumference --      Peak Flow --      Pain Score 06/24/23 1632 6     Pain Loc --      Pain Education --      Exclude from Growth Chart --    No data found.  Updated Vital Signs BP 131/84 (BP Location: Left Arm)   Pulse 92   Temp 97.7 F (36.5 C) (Oral)   Resp 18   Ht 5\' 5"  (1.651 m)   Wt 221 lb 1.9 oz (100.3 kg)   LMP  (LMP Unknown)   SpO2 96%   BMI 36.80 kg/m      Physical Exam Vitals and nursing note reviewed.  Constitutional:      General: She is not in acute distress.    Appearance: Normal appearance. She is not ill-appearing.  HENT:     Head: Normocephalic and atraumatic.  Eyes:     Conjunctiva/sclera: Conjunctivae normal.  Cardiovascular:     Rate and Rhythm: Normal rate.  Pulmonary:     Effort: Pulmonary effort is normal. No respiratory distress.  Abdominal:     General: Abdomen is flat.     Palpations: Abdomen  is soft.     Tenderness: There is no abdominal tenderness.   Musculoskeletal:     Comments: No tenderness to palpation of right lower back.  Neurological:     Mental Status: She is alert.  Psychiatric:        Mood and Affect: Mood normal.        Behavior: Behavior normal.        Thought Content: Thought content normal.      UC Treatments / Results  Labs (all labs ordered are listed, but only abnormal results are displayed) Labs Reviewed  POCT URINALYSIS DIP (MANUAL ENTRY) - Abnormal; Notable for the following components:      Result Value   Glucose, UA =500 (*)    Blood, UA trace-intact (*)    All other components within normal limits  POCT FASTING CBG [facility] MANUAL ENTRY - Abnormal; Notable for the following components:   POCT Glucose ([facility]) 118 (*)    All other components within normal limits  CBC WITH DIFFERENTIAL/PLATELET  COMPREHENSIVE METABOLIC PANEL    EKG   Radiology No results found.  Procedures Procedures (including critical care time)  Medications Ordered in UC Medications - No data to display  Initial Impression / Assessment and Plan / UC Course  I have reviewed the triage vital signs and the nursing notes.  Pertinent labs & imaging results that were available during my care of the patient were reviewed by me and considered in my medical decision making (see chart for details).    Unclear etiology of symptoms.  Will order labs to rule out kidney dysfunction.  UA without concerning findings.  Recommended follow-up in the emergency room with any worsening pain.  Final Clinical Impressions(s) / UC Diagnoses   Final diagnoses:  Right flank pain   Discharge Instructions   None    ED Prescriptions   None    PDMP not reviewed this encounter.   Tomi Bamberger, PA-C 06/24/23 1811

## 2023-06-25 LAB — COMPREHENSIVE METABOLIC PANEL
ALT: 34 [IU]/L — ABNORMAL HIGH (ref 0–32)
AST: 19 [IU]/L (ref 0–40)
Albumin: 4 g/dL (ref 3.9–4.9)
Alkaline Phosphatase: 107 [IU]/L (ref 44–121)
BUN/Creatinine Ratio: 17 (ref 12–28)
BUN: 13 mg/dL (ref 8–27)
Bilirubin Total: 0.3 mg/dL (ref 0.0–1.2)
CO2: 18 mmol/L — ABNORMAL LOW (ref 20–29)
Calcium: 9.5 mg/dL (ref 8.7–10.3)
Chloride: 102 mmol/L (ref 96–106)
Creatinine, Ser: 0.77 mg/dL (ref 0.57–1.00)
Globulin, Total: 2.9 g/dL (ref 1.5–4.5)
Glucose: 107 mg/dL — ABNORMAL HIGH (ref 70–99)
Potassium: 4 mmol/L (ref 3.5–5.2)
Sodium: 138 mmol/L (ref 134–144)
Total Protein: 6.9 g/dL (ref 6.0–8.5)
eGFR: 84 mL/min/{1.73_m2} (ref >59)

## 2023-06-25 LAB — CBC WITH DIFFERENTIAL/PLATELET
Basophils Absolute: 0.1 10*3/uL (ref 0.0–0.2)
Basos: 1 %
EOS (ABSOLUTE): 0.1 10*3/uL (ref 0.0–0.4)
Eos: 1 %
Hematocrit: 46.5 % (ref 34.0–46.6)
Hemoglobin: 15.2 g/dL (ref 11.1–15.9)
Immature Grans (Abs): 0 10*3/uL (ref 0.0–0.1)
Immature Granulocytes: 0 %
Lymphocytes Absolute: 4.9 10*3/uL — ABNORMAL HIGH (ref 0.7–3.1)
Lymphs: 52 %
MCH: 28.2 pg (ref 26.6–33.0)
MCHC: 32.7 g/dL (ref 31.5–35.7)
MCV: 86 fL (ref 79–97)
Monocytes Absolute: 0.6 10*3/uL (ref 0.1–0.9)
Monocytes: 7 %
Neutrophils Absolute: 3.5 10*3/uL (ref 1.4–7.0)
Neutrophils: 39 %
Platelets: 241 10*3/uL (ref 150–450)
RBC: 5.39 x10E6/uL — ABNORMAL HIGH (ref 3.77–5.28)
RDW: 13.3 % (ref 11.7–15.4)
WBC: 9.2 10*3/uL (ref 3.4–10.8)

## 2023-06-28 ENCOUNTER — Emergency Department (HOSPITAL_COMMUNITY): Payer: 59

## 2023-06-28 ENCOUNTER — Emergency Department (HOSPITAL_COMMUNITY)
Admission: EM | Admit: 2023-06-28 | Discharge: 2023-06-28 | Disposition: A | Discharge status: Home / Self Care | Payer: 59 | Attending: Emergency Medicine | Admitting: Emergency Medicine

## 2023-06-28 ENCOUNTER — Other Ambulatory Visit: Payer: Self-pay

## 2023-06-28 ENCOUNTER — Encounter (HOSPITAL_COMMUNITY): Payer: Self-pay | Admitting: *Deleted

## 2023-06-28 DIAGNOSIS — N3000 Acute cystitis without hematuria: Secondary | ICD-10-CM | POA: Insufficient documentation

## 2023-06-28 DIAGNOSIS — E119 Type 2 diabetes mellitus without complications: Secondary | ICD-10-CM | POA: Insufficient documentation

## 2023-06-28 DIAGNOSIS — Z79899 Other long term (current) drug therapy: Secondary | ICD-10-CM | POA: Diagnosis not present

## 2023-06-28 DIAGNOSIS — R109 Unspecified abdominal pain: Secondary | ICD-10-CM

## 2023-06-28 DIAGNOSIS — K5710 Diverticulosis of small intestine without perforation or abscess without bleeding: Secondary | ICD-10-CM | POA: Diagnosis not present

## 2023-06-28 DIAGNOSIS — Z7984 Long term (current) use of oral hypoglycemic drugs: Secondary | ICD-10-CM | POA: Diagnosis not present

## 2023-06-28 DIAGNOSIS — I10 Essential (primary) hypertension: Secondary | ICD-10-CM | POA: Diagnosis not present

## 2023-06-28 DIAGNOSIS — R9389 Abnormal findings on diagnostic imaging of other specified body structures: Secondary | ICD-10-CM | POA: Diagnosis not present

## 2023-06-28 LAB — COMPREHENSIVE METABOLIC PANEL
ALT: 48 U/L — ABNORMAL HIGH (ref 0–44)
AST: 27 U/L (ref 15–41)
Albumin: 3.6 g/dL (ref 3.5–5.0)
Alkaline Phosphatase: 80 U/L (ref 38–126)
Anion gap: 10 (ref 5–15)
BUN: 19 mg/dL (ref 8–23)
CO2: 28 mmol/L (ref 22–32)
Calcium: 9.8 mg/dL (ref 8.9–10.3)
Chloride: 100 mmol/L (ref 98–111)
Creatinine, Ser: 0.79 mg/dL (ref 0.44–1.00)
GFR, Estimated: >60 mL/min (ref >60)
Glucose, Bld: 196 mg/dL — ABNORMAL HIGH (ref 70–99)
Potassium: 4 mmol/L (ref 3.5–5.1)
Sodium: 138 mmol/L (ref 135–145)
Total Bilirubin: 0.4 mg/dL (ref 0.3–1.2)
Total Protein: 6.8 g/dL (ref 6.5–8.1)

## 2023-06-28 LAB — URINALYSIS, ROUTINE W REFLEX MICROSCOPIC
Bilirubin Urine: NEGATIVE
Glucose, UA: 50 mg/dL — AB
Hgb urine dipstick: NEGATIVE
Ketones, ur: NEGATIVE mg/dL
Nitrite: NEGATIVE
Protein, ur: NEGATIVE mg/dL
Specific Gravity, Urine: 1.023 (ref 1.005–1.030)
pH: 5 (ref 5.0–8.0)

## 2023-06-28 LAB — CBC
HCT: 47.9 % — ABNORMAL HIGH (ref 36.0–46.0)
Hemoglobin: 15.5 g/dL — ABNORMAL HIGH (ref 12.0–15.0)
MCH: 28 pg (ref 26.0–34.0)
MCHC: 32.4 g/dL (ref 30.0–36.0)
MCV: 86.6 fL (ref 80.0–100.0)
Platelets: 240 10*3/uL (ref 150–400)
RBC: 5.53 MIL/uL — ABNORMAL HIGH (ref 3.87–5.11)
RDW: 13.8 % (ref 11.5–15.5)
WBC: 10.6 10*3/uL — ABNORMAL HIGH (ref 4.0–10.5)
nRBC: 0 % (ref 0.0–0.2)

## 2023-06-28 LAB — LIPASE, BLOOD: Lipase: 34 U/L (ref 11–51)

## 2023-06-28 MED ORDER — CELECOXIB 200 MG PO CAPS
200.0000 mg | ORAL_CAPSULE | Freq: Once | ORAL | Status: AC
  Administered 2023-06-28: 200 mg via ORAL
  Filled 2023-06-28 (×2): qty 1

## 2023-06-28 MED ORDER — KETOROLAC TROMETHAMINE 15 MG/ML IJ SOLN
15.0000 mg | Freq: Once | INTRAMUSCULAR | Status: DC
  Filled 2023-06-28: qty 1

## 2023-06-28 MED ORDER — CEPHALEXIN 500 MG PO CAPS: 500.0000 mg | ORAL_CAPSULE | Freq: Two times a day (BID) | ORAL | 0 refills | Status: AC

## 2023-06-28 NOTE — ED Provider Notes (Signed)
[place] EMERGENCY DEPARTMENT AT [facility] Provider Note   CSN: [id number] Arrival date & time: 06/28/23  1734     History  Chief Complaint  Patient presents with   Flank Pain    Colleen Williams is a 67 y.o. female.   Flank Pain  67 year old female with past medical history of hypertension, type 2 diabetes mellitus, hyperlipidemia presenting for evaluation of right-sided flank pain.  Patient states that pain has been present for approximately the last 2 months.  Symptoms are not present every day, but have worsened in recent days.  She describes sharp pain radiating from her low back into her right groin.  Denies any exacerbating or relieving factors.  Tylenol and ibuprofen only provided minimal relief.  Patient denies any pain with urination or urinary frequency.  No atypical vaginal bleeding, vaginal discharge.  Last bowel movement was yesterday.  Denies any history of kidney stones.     Home Medications Prior to Admission medications   Medication Sig Start Date End Date Taking? Authorizing Provider  cephALEXin (KEFLEX) 500 MG capsule Take 1 capsule (500 mg total) by mouth 2 (two) times daily for 7 days. 06/28/23 07/05/23 Yes Lyman Speller, MD  acetaminophen-codeine (TYLENOL #3) 300-30 MG tablet Take 1 tablet by mouth every 6 (six) hours. 04/29/22   [provider]  AREXVY 120 MCG/0.5ML injection Inject 0.5 mLs into the muscle once. 03/04/23   [provider]  Blood Glucose Monitoring Suppl (ACCU-CHEK GUIDE) w/Device KIT See admin instructions. 01/21/22   [provider]  glucose blood test strip Use four times daily with insulin as directed. 10/28/22   Evlyn Kanner, MD  glucose monitoring kit (FREESTYLE) monitoring kit 1 each by Does not apply route as needed for other. 01/21/22   Ellison Carwin, MD  hydrochlorothiazide (HYDRODIURIL) 12.5 MG tablet Take 1 tablet (12.5 mg total) by mouth daily. 07/26/22   Evlyn Kanner, MD   HYDROcodone-acetaminophen (NORCO/VICODIN) 5-325 MG tablet Take 1 tablet by mouth every 4 (four) hours as needed for moderate pain. 02/17/23   Gilda Crease, MD  JARDIANCE 10 MG TABS tablet Take 10 mg by mouth daily. 06/19/23   [provider]  Lancets (FREESTYLE) lancets Use as instructed 05/06/23   Hassan Rowan, [place], MD  latanoprost (XALATAN) 0.005 % ophthalmic solution Place 1 drop into both eyes at bedtime. 06/14/23   [provider]  lidocaine (LIDODERM) 5 % Place 1 patch onto the skin daily. Remove & Discard patch within 12 hours or as directed by MD 10/06/22   Ernie Avena, MD  losartan (COZAAR) 50 MG tablet Take 1 tablet (50 mg total) by mouth daily. 07/26/22   Evlyn Kanner, MD  rosuvastatin (CRESTOR) 20 MG tablet Take 1 tablet (20 mg total) by mouth daily. 01/27/23 01/22/24  Rocky Morel, DO      Allergies    Patient has no known allergies.    Review of Systems   Review of Systems  Genitourinary:  Positive for flank pain.    Physical Exam Updated Vital Signs BP 135/81   Pulse 90   Temp 98 F (36.7 C)   Resp 20   Ht 5\' 5"  (1.651 m)   Wt 100.3 kg   LMP  (LMP Unknown)   SpO2 96%   BMI 36.80 kg/m  Physical Exam HENT:     Head: Normocephalic and atraumatic.     Right Ear: External ear normal.     Left Ear: External ear normal.     Nose:  Nose normal.     Mouth/Throat:     Mouth: Mucous membranes are moist.     Pharynx: Oropharynx is clear.  Eyes:     Extraocular Movements: Extraocular movements intact.     Conjunctiva/sclera: Conjunctivae normal.  Cardiovascular:     Rate and Rhythm: Regular rhythm. Tachycardia present.     Pulses: Normal pulses.  Pulmonary:     Effort: Pulmonary effort is normal. No respiratory distress.     Breath sounds: No wheezing.  Abdominal:     General: Abdomen is flat.     Palpations: Abdomen is soft.     Tenderness: There is no right CVA tenderness, left CVA tenderness, guarding or rebound.   Musculoskeletal:        General: Normal range of motion.     Cervical back: Normal range of motion. No tenderness.     Right lower leg: No edema.     Left lower leg: No edema.     Comments: No midline cervical, thoracic, lumbar spine tenderness.  No paraspinal muscle tenderness in lumbar region either  Skin:    General: Skin is warm and dry.     Findings: No rash.  Neurological:     General: No focal deficit present.     Mental Status: She is alert.     Cranial Nerves: No cranial nerve deficit.     Motor: No weakness.     ED Results / Procedures / Treatments   Labs (all labs ordered are listed, but only abnormal results are displayed) Labs Reviewed  COMPREHENSIVE METABOLIC PANEL - Abnormal; Notable for the following components:      Result Value   Glucose, Bld 196 (*)    ALT 48 (*)    All other components within normal limits  CBC - Abnormal; Notable for the following components:   WBC 10.6 (*)    RBC 5.53 (*)    Hemoglobin 15.5 (*)    HCT 47.9 (*)    All other components within normal limits  URINALYSIS, ROUTINE W REFLEX MICROSCOPIC - Abnormal; Notable for the following components:   APPearance HAZY (*)    Glucose, UA 50 (*)    Leukocytes,Ua SMALL (*)    Bacteria, UA RARE (*)    All other components within normal limits  LIPASE, BLOOD    EKG None  Radiology CT Renal Stone Study  Result Date: 06/28/2023 CLINICAL DATA:  Abdominal/flank pain, stone suspected. Right flank pain EXAM: CT ABDOMEN AND PELVIS WITHOUT CONTRAST TECHNIQUE: Multidetector CT imaging of the abdomen and pelvis was performed following the standard protocol without IV contrast. RADIATION DOSE REDUCTION: This exam was performed according to the departmental dose-optimization program which includes automated exposure control, adjustment of the mA and/or kV according to patient size and/or use of iterative reconstruction technique. COMPARISON:  CT abdomen and pelvis 02/17/2023 FINDINGS: Lower chest: No  acute abnormality. Hepatobiliary: Unremarkable liver. Normal gallbladder. No biliary dilation. Pancreas: Unremarkable. Spleen: Unremarkable. Adrenals/Urinary Tract: Normal adrenal glands. No urinary calculi or hydronephrosis. Bladder is unremarkable. Stomach/Bowel: Normal caliber large and small bowel. No bowel wall thickening. The appendix is normal.Stomach is within normal limits. Duodenal diverticulum. Vascular/Lymphatic: Aortic atherosclerosis. No enlarged abdominal or pelvic lymph nodes. Reproductive: Endometrial thickening or fluid in the endometrial canal measuring 15 mm, unchanged from 02/17/2023. No adnexal mass. Other: No free intraperitoneal fluid or air. Musculoskeletal: No acute fracture. IMPRESSION: 1. No acute abnormality in the abdomen or pelvis. 2. Endometrial thickening or fluid in the endometrial canal measuring 15  mm, unchanged from 02/17/2023. Recommend nonemergent pelvic ultrasound for further evaluation. Aortic Atherosclerosis (ICD10-I70.0). Electronically Signed   By: Minerva Fester M.D.   On: 06/28/2023 20:46    Procedures Procedures    Medications Ordered in ED Medications  celecoxib (CELEBREX) capsule 200 mg (has no administration in time range)    ED Course/ Medical Decision Making/ A&P Clinical Course as of 06/28/23 2122  Sat Jun 28, 2023  1935 Chronic flank plain.  Refusing further IV for dx mgmnt today.  Will evaluate for UTI versus nephrolithiasis. [CC]    Clinical Course User Index [CC] Glyn Ade, MD                                 Medical Decision Making Amount and/or Complexity of Data Reviewed Radiology: ordered.  Risk Prescription drug management.   Colleen Williams is a 67 y.o. female with significant PMH of hypertension, type 2 diabetes mellitus, hyperlipidemia, who presents to the emergency department with right abdominal pain and flank pain.  Afebrile, hemodynamically stable.   Nephrolithiasis, pyelonephritis, UTI right top of  differential given patient's location of pain and associated symptoms. No report of traumatic or toxic ingestion event, exam without dehydration, therefore not likely DKA.  No blood in stool to suggest IBD, and given abdominal exam without peritonitis or involuntary guarding, doubt appendicitis, intussusception, volvulus, or bowel obstruction.  Last BM yesterday.   Exam does not reveal hernia, do not suspect incarcerated or strangulated hernia at this time. No RUQ pain upon exam therefore do not suspect gallbladder disease including cholecystitis, cholangitis or gallstones/CBD stones. No hypoxia and no respiratory symptoms concerning for PNA or URI. No sudden onset lower quadrant pain, HPI not consistent with ovarian torsion.  Nor is it consistent tubal ovarian abscess, or STD.   Labs obtained. Lipase 34. WBC 10.6.  UA showed signs of a UTI with hazy appearance, positive leukocytes, and bacteria.   Based on the patient's clinical picture, I do feel that CT abd/pelvis is necessary at this time to assess for signs of nephrolithiasis, renal cyst, renal infection.  The scan was reviewed and did not show any acute pathology about account for her symptoms.  Specifically, no signs of pyelonephritis or kidney stones.  CT did comment on thickened endometrium, unchanged for the last 4 to 5 months.  She will follow-up with primary care regarding this finding.  Discussed above workup with patient in depth, who is relieved.  She is comfortable being discharged home with a prescription for an antibiotic for UTI coverage.   I estimate there is low risk for acute appendicitis, bowel obstruction, cholecystitis, diverticulitis, incarcerated hernia, mesenteric ischemia, pancreatitis, perforated bowl or ulcer, or abdominal aortic aneurism, thus I consider the discharge disposition reasonable. Also, there is no evidence or peritonitis, sepsis or toxicity. We have discussed the diagnosis and risks and agree with discharging home  to follow-up with their primary doctor. We also discussed returning to the Emergency Department immediately if new or worsening symptoms occur and have discussed the symptoms which are most concerning (e.g., bloody stool, fever, changing or worsening pain, vomiting) that necessitate immediate return.   The plan for this patient was discussed with Dr. Doran Durand, who voiced agreement and who oversaw evaluation and treatment of this patient.    Final Clinical Impression(s) / ED Diagnoses Final diagnoses:  Right flank pain  Acute cystitis without hematuria    Rx / DC Orders ED  Discharge Orders          Ordered    cephALEXin (KEFLEX) 500 MG capsule  2 times daily        06/28/23 2117              Lyman Speller, MD 06/28/23 2122    Glyn Ade, MD 06/28/23 2303

## 2023-06-28 NOTE — Discharge Instructions (Addendum)
Colleen Williams:  Thank you for allowing [place] to take care of you today.  We hope you begin feeling better soon. You were seen today for right flank and groin pain.  You likely have a urinary tract infection  To-Do: Please follow-up with your primary doctor to schedule an appointment with a new primary care doctor within the next 2-3 days. Begin taking Keflex, an antibiotic for UTI.  Please take this medicine in its entirety Follow-up with primary care regarding endometrial thickening (unchanged since June of this year)  Please return to the Emergency Department or call 911 if you experience chest pain, shortness of breath, severe pain, severe fever, altered mental status, or have any reason to think that you need emergency medical care.  Thank you again.  Hope you feel better soon.

## 2023-06-28 NOTE — ED Triage Notes (Signed)
The pt has had rt flank pain for one week  no bloody urine no urinary symptoms  no temp  she was seen at urgent care  for the same a few days ago  no dx

## 2023-07-07 ENCOUNTER — Encounter: Payer: Self-pay | Admitting: Emergency Medicine

## 2023-07-07 ENCOUNTER — Ambulatory Visit
Admission: EM | Admit: 2023-07-07 | Discharge: 2023-07-07 | Disposition: A | Discharge status: Home / Self Care | Payer: 59 | Attending: Physician Assistant | Admitting: Physician Assistant

## 2023-07-07 DIAGNOSIS — R220 Localized swelling, mass and lump, head: Secondary | ICD-10-CM | POA: Diagnosis not present

## 2023-07-07 MED ORDER — PREDNISONE 20 MG PO TABS
40.0000 mg | ORAL_TABLET | Freq: Every day | ORAL | 0 refills | Status: AC
Start: 2023-07-07 — End: 2023-07-12

## 2023-07-07 NOTE — ED Provider Notes (Signed)
[facility]    CSN: [id number] Arrival date & time: 07/07/23  7829      History   Chief Complaint Chief Complaint  Patient presents with   Facial Swelling    HPI Colleen Williams is a 67 y.o. female.   Patient here today for evaluation of left-sided facial swelling that started yesterday.  She notes she has had some itching to the area as well.  She is unsure of any exposures or triggers.  She denies any shortness of breath or trouble swallowing.  She does not report treatment for symptoms.  She has not had recent fever.  The history is provided by the patient.    Past Medical History:  Diagnosis Date   Arthritis    Diabetes mellitus without complication (HCC)    Hyperlipidemia    Hypertension    Vitreous floaters of both eyes 08/03/2017    Patient Active Problem List   Diagnosis Date Noted   Morbid obesity (HCC) 04/24/2022   Back pain 03/15/2020   Hyperlipidemia associated with type 2 diabetes mellitus (HCC) 11/12/2019   Essential hypertension 08/03/2017   Type 2 diabetes mellitus (HCC) 01/30/2017   Osteoarthritis of ankle and foot 07/30/2016   Healthcare maintenance 06/12/2015    Past Surgical History:  Procedure Laterality Date   COLONOSCOPY     TUBAL LIGATION      OB History     Gravida  2   Para      Term      Preterm      AB      Living  2      SAB      IAB      Ectopic      Multiple      Live Births  2            Home Medications    Prior to Admission medications   Medication Sig Start Date End Date Taking? Authorizing Provider  hydrochlorothiazide (HYDRODIURIL) 12.5 MG tablet Take 1 tablet (12.5 mg total) by mouth daily. 07/26/22  Yes Evlyn Kanner, MD  JARDIANCE 10 MG TABS tablet Take 10 mg by mouth daily. 06/19/23  Yes [provider]  latanoprost (XALATAN) 0.005 % ophthalmic solution Place 1 drop into both eyes at bedtime. 06/14/23  Yes [provider]  losartan (COZAAR) 50 MG tablet  Take 1 tablet (50 mg total) by mouth daily. 07/26/22  Yes Evlyn Kanner, MD  predniSONE (DELTASONE) 20 MG tablet Take 2 tablets (40 mg total) by mouth daily with breakfast for 5 days. 07/07/23 07/12/23 Yes Tomi Bamberger, PA-C  rosuvastatin (CRESTOR) 20 MG tablet Take 1 tablet (20 mg total) by mouth daily. 01/27/23 01/22/24 Yes Rocky Morel, DO  acetaminophen-codeine (TYLENOL #3) 300-30 MG tablet Take 1 tablet by mouth every 6 (six) hours. 04/29/22   [provider]  Blood Glucose Monitoring Suppl (ACCU-CHEK GUIDE) w/Device KIT See admin instructions. 01/21/22   [provider]  glucose blood test strip Use four times daily with insulin as directed. 10/28/22   Evlyn Kanner, MD  glucose monitoring kit (FREESTYLE) monitoring kit 1 each by Does not apply route as needed for other. 01/21/22   Ellison Carwin, MD  HYDROcodone-acetaminophen (NORCO/VICODIN) 5-325 MG tablet Take 1 tablet by mouth every 4 (four) hours as needed for moderate pain. 02/17/23   Gilda Crease, MD  Lancets (FREESTYLE) lancets Use as instructed 05/06/23   Hassan Rowan, [place], MD  lidocaine (LIDODERM) 5 % Place 1 patch  onto the skin daily. Remove & Discard patch within 12 hours or as directed by MD 10/06/22   Ernie Avena, MD    Family History Family History  Problem Relation Age of Onset   Diabetes Mother    Colon cancer Neg Hx    Colon polyps Neg Hx    Esophageal cancer Neg Hx    Stomach cancer Neg Hx    Rectal cancer Neg Hx    Breast cancer Neg Hx     Social History Social History   Tobacco Use   Smoking status: Former    Current packs/day: 0.00    Types: Cigarettes    Quit date: 09/16/2008    Years since quitting: 14.8   Smokeless tobacco: Never  Vaping Use   Vaping status: Never Used  Substance Use Topics   Alcohol use: Yes    Alcohol/week: 0.0 standard drinks of alcohol    Comment: Occasionally wine.   Drug use: No     Allergies   Patient has no known  allergies.   Review of Systems Review of Systems  Constitutional:  Negative for chills and fever.  HENT:  Positive for facial swelling.   Eyes:  Negative for discharge and redness.  Respiratory:  Negative for shortness of breath.   Gastrointestinal:  Negative for abdominal pain, nausea and vomiting.     Physical Exam Triage Vital Signs ED Triage Vitals  Encounter Vitals Group     BP      Systolic BP Percentile      Diastolic BP Percentile      Pulse      Resp      Temp      Temp src      SpO2      Weight      Height      Head Circumference      Peak Flow      Pain Score      Pain Loc      Pain Education      Exclude from Growth Chart    No data found.  Updated Vital Signs BP 125/80 (BP Location: Right Arm)   Pulse 84   Temp 98.8 F (37.1 C) (Oral)   Resp 16   LMP  (LMP Unknown)   SpO2 95%      Physical Exam Vitals and nursing note reviewed.  Constitutional:      General: She is not in acute distress.    Appearance: Normal appearance. She is not ill-appearing.  HENT:     Head: Normocephalic and atraumatic.     Comments: Mild swelling appreciated to left upper and lower lid, left lateral nares, left chin area.  Vesicular lesions noted to left lateral nares and left chin area    Mouth/Throat:     Mouth: Mucous membranes are moist.     Pharynx: Oropharynx is clear. No oropharyngeal exudate or posterior oropharyngeal erythema.  Eyes:     Conjunctiva/sclera: Conjunctivae normal.  Cardiovascular:     Rate and Rhythm: Normal rate.  Pulmonary:     Effort: Pulmonary effort is normal. No respiratory distress.  Neurological:     Mental Status: She is alert.  Psychiatric:        Mood and Affect: Mood normal.        Behavior: Behavior normal.        Thought Content: Thought content normal.      UC Treatments / Results  Labs (all labs ordered are listed, but only  abnormal results are displayed) Labs Reviewed - No data to display  EKG   Radiology No  results found.  Procedures Procedures (including critical care time)  Medications Ordered in UC Medications - No data to display  Initial Impression / Assessment and Plan / UC Course  I have reviewed the triage vital signs and the nursing notes.  Pertinent labs & imaging results that were available during my care of the patient were reviewed by me and considered in my medical decision making (see chart for details).    Suspect contact dermatitis of some sort.  Will treat with steroids but advised symptoms may increase glucose levels.  Patient declines injection in office and would prefer oral treatment.  Advised OTC Benadryl as well.  Encouraged follow-up if no gradual improvement with any further concerns.  Recommended further evaluation in the emergency room with any worsening.  Final Clinical Impressions(s) / UC Diagnoses   Final diagnoses:  Left facial swelling   Discharge Instructions   None    ED Prescriptions     Medication Sig Dispense Auth. Provider   predniSONE (DELTASONE) 20 MG tablet Take 2 tablets (40 mg total) by mouth daily with breakfast for 5 days. 10 tablet Tomi Bamberger, PA-C      PDMP not reviewed this encounter.   Tomi Bamberger, PA-C 07/07/23 [phone]

## 2023-07-07 NOTE — ED Triage Notes (Addendum)
Reports left sided facial swelling starting yesterday. Woke up this morning and noticed left sided periorbital, left cheek, nasal, and circumoral redness, swelling, and itching. Denies any tongue or throat involvement. Denies any previous history of same, denies difficulties with speech or swallowing, denies any new skin products or food that could've triggered this. States she was seen here around a week ago for a kidney infection, states she finished the meds to treat that a few days ago with no other issues. Denies any changes to vision since onset of symptoms.

## 2023-07-09 ENCOUNTER — Encounter: Payer: Self-pay | Admitting: Dietician

## 2023-07-30 ENCOUNTER — Ambulatory Visit: Payer: 59 | Admitting: Student

## 2023-07-30 ENCOUNTER — Encounter: Payer: Self-pay | Admitting: Student

## 2023-07-30 VITALS — BP 129/84 | HR 92 | Wt 220.5 lb

## 2023-07-30 DIAGNOSIS — Z7984 Long term (current) use of oral hypoglycemic drugs: Secondary | ICD-10-CM | POA: Diagnosis not present

## 2023-07-30 DIAGNOSIS — E1169 Type 2 diabetes mellitus with other specified complication: Secondary | ICD-10-CM | POA: Diagnosis not present

## 2023-07-30 DIAGNOSIS — E785 Hyperlipidemia, unspecified: Secondary | ICD-10-CM

## 2023-07-30 DIAGNOSIS — E119 Type 2 diabetes mellitus without complications: Secondary | ICD-10-CM

## 2023-07-30 DIAGNOSIS — Z0000 Encounter for general adult medical examination without abnormal findings: Secondary | ICD-10-CM

## 2023-07-30 DIAGNOSIS — I10 Essential (primary) hypertension: Secondary | ICD-10-CM

## 2023-07-30 LAB — GLUCOSE, CAPILLARY: Glucose-Capillary: 105 mg/dL — ABNORMAL HIGH (ref 70–99)

## 2023-07-30 LAB — POCT GLYCOSYLATED HEMOGLOBIN (HGB A1C): Hemoglobin A1C: 7.7 % — AB (ref 4.0–5.6)

## 2023-07-30 MED ORDER — EMPAGLIFLOZIN 25 MG PO TABS
25.0000 mg | ORAL_TABLET | Freq: Every day | ORAL | 11 refills | Status: DC
Start: 2023-07-30 — End: 2024-01-12

## 2023-07-30 NOTE — Assessment & Plan Note (Signed)
Lipid panel 9 months ago LDL 63, at goal < 100. Taking rosuvastatin 20 mg daily, tolerating well.  - Continue rosuvastatin 20 mg daily

## 2023-07-30 NOTE — Patient Instructions (Signed)
Thank you, Ms.Josi Leodis Binet for allowing [place] to provide your care today. Today we discussed your diabetes, blood pressure, cholesterol, and healthcare maintenance.   I have ordered the following labs for you:   Lab Orders         Microalbumin / Creatinine Urine Ratio         Glucose, capillary         POC Hbg A1C      Tests ordered today:  - None  Referrals ordered today:   Referral Orders  No referral(s) requested today     I have ordered the following medication/changed the following medications:   Stop the following medications: Medications Discontinued During This Encounter  Medication Reason   HYDROcodone-acetaminophen (NORCO/VICODIN) 5-325 MG tablet Patient has not taken in last 30 days   JARDIANCE 10 MG TABS tablet Dose change     Start the following medications: Meds ordered this encounter  Medications   empagliflozin (JARDIANCE) 25 MG TABS tablet    Sig: Take 1 tablet (25 mg total) by mouth daily before breakfast.    Dispense:  30 tablet    Refill:  11     Follow up: 3 months    Remember:   - STOP taking the Jardiance 10 mg, START taking the Jardiance 25 mg daily  - We did not make any other changes to your blood pressure medications or your cholesterol medication. Continue to take your Losartan, Hydrochlorothiazide, and your Rosuvastatin.   Should you have any questions or concerns please call the internal medicine clinic at [phone].     Rhyder Koegel Colbert Coyer, MD PGY-1 Internal Medicine Teaching Progam [facility]

## 2023-07-30 NOTE — Assessment & Plan Note (Signed)
Patient's Hgb A1c today 7.7% ,CBG 105. Hgb A1c from 04/29/2023 6.7%. Has been taking Jardiance 10 mg daily, tolerating well. Denied any back pain today, says it improved since stopping Synjardy (thought Metformin was causing back pain at last visit). Needs assistance with meter supplies, Kaye calling pharmacy to help determine what she needs so she can continue to check her glucose as needed. Discussed need for increasing Jardiance or adding GLP-1 agonist. Patient does not like injections, would prefer oral agents. Between both options, preferred going up on Jardiance as she feels she is doing well on it. Will increase dose today and check Hgb A1c in 3 months.  - Discontinue Jardiance 10 mg daily, START Jardiance 25 mg daily  - Repeat Hgb A1c in 3 months  - Follow up on urine microalbumin/creatinine ratio as needed

## 2023-07-30 NOTE — Progress Notes (Signed)
Established Patient Office Visit  Subjective   Patient ID: Colleen Williams, female    DOB: 1956/06/21  Age: 67 y.o. MRN: [id number]  Chief Complaint  Patient presents with   Medical Management of Chronic Issues    Requests assistance with obtaining DM testing supplies     Patient is a 67 yo with a past medical history stated below who presents today for follow-up for T2DM, HTN, and cholesterol. Please see problem based assessment and plan for additional details.     Past Medical History:  Diagnosis Date   Arthritis    Diabetes mellitus without complication (HCC)    Hyperlipidemia    Hypertension    Vitreous floaters of both eyes 08/03/2017   Review of Systems  Cardiovascular:  Negative for chest pain.  Gastrointestinal:  Negative for abdominal pain, nausea and vomiting.  Genitourinary:  Negative for dysuria.  Musculoskeletal:  Negative for back pain.  Neurological:  Negative for dizziness and headaches.      Objective:     BP 129/84 (BP Location: Right Arm, Patient Position: Sitting, Cuff Size: Normal)   Pulse 92   Wt 220 lb 8 oz (100 kg)   LMP  (LMP Unknown)   SpO2 97%   BMI 36.69 kg/m  BP Readings from Last 3 Encounters:  07/30/23 129/84  07/07/23 125/80  06/28/23 129/76   Wt Readings from Last 3 Encounters:  07/30/23 220 lb 8 oz (100 kg)  06/28/23 221 lb 1.9 oz (100.3 kg)  06/24/23 221 lb 1.9 oz (100.3 kg)   Physical Exam HENT:     Head: Normocephalic and atraumatic.     Nose: Nose normal.     Mouth/Throat:     Mouth: Mucous membranes are moist.  Cardiovascular:     Rate and Rhythm: Normal rate and regular rhythm.     Pulses: Normal pulses.     Heart sounds: Normal heart sounds.  Pulmonary:     Effort: Pulmonary effort is normal.  Abdominal:     General: Bowel sounds are normal.     Palpations: Abdomen is soft.  Musculoskeletal:        General: No swelling or tenderness.  Skin:    General: Skin is warm and dry.  Neurological:     General: No  focal deficit present.     Mental Status: She is alert.  Psychiatric:        Mood and Affect: Mood normal.        Behavior: Behavior normal.    Results for orders placed or performed in visit on 07/30/23  Glucose, capillary  Result Value Ref Range   Glucose-Capillary 105 (H) 70 - 99 mg/dL  POC Hbg W0J  Result Value Ref Range   Hemoglobin A1C 7.7 (A) 4.0 - 5.6 %   HbA1c POC (<> result, manual entry)     HbA1c, POC (prediabetic range)     HbA1c, POC (controlled diabetic range)      Last hemoglobin A1c Lab Results  Component Value Date   HGBA1C 7.7 (A) 07/30/2023    The 10-year ASCVD risk score (Arnett DK, et al., 2019) is: 16.1%    Assessment & Plan:   Problem List Items Addressed This Visit     Healthcare maintenance    Mammogram June 2024 normal, repeat in 1 year. Pap smear not due until 10/30/2023. Colonoscopy 2023, polyps removed, follow up in 5 years for repeat. Patient declined flu vaccine today.       Type 2 diabetes  mellitus (HCC) - Primary    Patient's Hgb A1c today 7.7% ,CBG 105. Hgb A1c from 04/29/2023 6.7%. Has been taking Jardiance 10 mg daily, tolerating well. Denied any back pain today, says it improved since stopping Synjardy (thought Metformin was causing back pain at last visit). Needs assistance with meter supplies, Kaye calling pharmacy to help determine what she needs so she can continue to check her glucose as needed. Discussed need for increasing Jardiance or adding GLP-1 agonist. Patient does not like injections, would prefer oral agents. Between both options, preferred going up on Jardiance as she feels she is doing well on it. Will increase dose today and check Hgb A1c in 3 months.  - Discontinue Jardiance 10 mg daily, START Jardiance 25 mg daily  - Repeat Hgb A1c in 3 months  - Follow up on urine microalbumin/creatinine ratio as needed      Relevant Medications   empagliflozin (JARDIANCE) 25 MG TABS tablet   Other Relevant Orders   POC Hbg A1C  (Completed)   Microalbumin / Creatinine Urine Ratio   Essential hypertension (Chronic)    Patient's BP 129/84 HR 92 today. Taking Losartan 25 mg and hydrochlorothiazide 12.5 mg daily, denies any issues. CMP 10/12 Cr WNL. No medication changes today, continue to monitor.  - Continue Losartan 25 mg daiy and Hydrochlorothiazide 12.5 mg daily      Hyperlipidemia associated with type 2 diabetes mellitus (HCC)    Lipid panel 9 months ago LDL 63, at goal < 100. Taking rosuvastatin 20 mg daily, tolerating well.  - Continue rosuvastatin 20 mg daily       Relevant Medications   empagliflozin (JARDIANCE) 25 MG TABS tablet   Return in about 3 months (around 10/30/2023) for Repeat A1c, T2DM management .  Patient seen with Dr. Mikey Bussing.   Lucella Pommier Colbert Coyer, MD

## 2023-07-30 NOTE — Assessment & Plan Note (Signed)
Mammogram June 2024 normal, repeat in 1 year. Pap smear not due until 10/30/2023. Colonoscopy 2023, polyps removed, follow up in 5 years for repeat. Patient declined flu vaccine today.

## 2023-07-30 NOTE — Assessment & Plan Note (Signed)
Patient's BP 129/84 HR 92 today. Taking Losartan 25 mg and hydrochlorothiazide 12.5 mg daily, denies any issues. CMP 10/12 Cr WNL. No medication changes today, continue to monitor.  - Continue Losartan 25 mg daiy and Hydrochlorothiazide 12.5 mg daily

## 2023-07-31 LAB — MICROALBUMIN / CREATININE URINE RATIO
Creatinine, Urine: 66.8 mg/dL
Microalb/Creat Ratio: 8 mg/g{creat} (ref 0–29)
Microalbumin, Urine: 5.3 ug/mL

## 2023-08-04 ENCOUNTER — Other Ambulatory Visit: Payer: Self-pay

## 2023-08-04 DIAGNOSIS — I10 Essential (primary) hypertension: Secondary | ICD-10-CM

## 2023-08-04 MED ORDER — LOSARTAN POTASSIUM 50 MG PO TABS: 50.0000 mg | ORAL_TABLET | Freq: Every day | ORAL | 3 refills | Status: DC

## 2023-08-04 NOTE — Progress Notes (Signed)
Internal Medicine Clinic Attending  I was physically present during the key portions of the resident provided service and participated in the medical decision making of patient's management care. I reviewed pertinent patient test results.  The assessment, diagnosis, and plan were formulated together and I agree with the documentation in the resident's note.  Gust Rung, DO

## 2023-08-04 NOTE — Addendum Note (Signed)
Addended by: Carlynn Purl C on: 08/04/2023 09:14 AM   Modules accepted: Level of Service

## 2023-08-04 NOTE — Addendum Note (Signed)
Addended by: Carlynn Purl C on: 08/04/2023 09:15 AM   Modules accepted: Level of Service

## 2023-08-04 NOTE — Telephone Encounter (Signed)
Medication sent to pharmacy  

## 2023-08-06 ENCOUNTER — Other Ambulatory Visit: Payer: Self-pay | Admitting: Student

## 2023-08-15 ENCOUNTER — Other Ambulatory Visit: Payer: Self-pay | Admitting: Student

## 2023-08-15 DIAGNOSIS — E1169 Type 2 diabetes mellitus with other specified complication: Secondary | ICD-10-CM

## 2023-08-18 ENCOUNTER — Other Ambulatory Visit: Payer: Self-pay | Admitting: Student

## 2023-08-25 DIAGNOSIS — H04123 Dry eye syndrome of bilateral lacrimal glands: Secondary | ICD-10-CM | POA: Diagnosis not present

## 2023-10-15 ENCOUNTER — Encounter (HOSPITAL_COMMUNITY): Payer: Self-pay

## 2023-10-15 ENCOUNTER — Emergency Department (HOSPITAL_COMMUNITY): Payer: 59

## 2023-10-15 ENCOUNTER — Emergency Department (HOSPITAL_COMMUNITY)
Admission: EM | Admit: 2023-10-15 | Discharge: 2023-10-15 | Disposition: A | Discharge status: Home / Self Care | Payer: 59 | Attending: Emergency Medicine | Admitting: Emergency Medicine

## 2023-10-15 ENCOUNTER — Other Ambulatory Visit: Payer: Self-pay

## 2023-10-15 DIAGNOSIS — Z79899 Other long term (current) drug therapy: Secondary | ICD-10-CM | POA: Diagnosis not present

## 2023-10-15 DIAGNOSIS — R1084 Generalized abdominal pain: Secondary | ICD-10-CM | POA: Diagnosis not present

## 2023-10-15 DIAGNOSIS — R9389 Abnormal findings on diagnostic imaging of other specified body structures: Secondary | ICD-10-CM

## 2023-10-15 DIAGNOSIS — N8500 Endometrial hyperplasia, unspecified: Secondary | ICD-10-CM | POA: Diagnosis not present

## 2023-10-15 DIAGNOSIS — I10 Essential (primary) hypertension: Secondary | ICD-10-CM | POA: Insufficient documentation

## 2023-10-15 DIAGNOSIS — R109 Unspecified abdominal pain: Secondary | ICD-10-CM | POA: Diagnosis not present

## 2023-10-15 DIAGNOSIS — D1803 Hemangioma of intra-abdominal structures: Secondary | ICD-10-CM | POA: Diagnosis not present

## 2023-10-15 DIAGNOSIS — K769 Liver disease, unspecified: Secondary | ICD-10-CM | POA: Diagnosis not present

## 2023-10-15 LAB — URINALYSIS, ROUTINE W REFLEX MICROSCOPIC
Bacteria, UA: NONE SEEN
Bilirubin Urine: NEGATIVE
Glucose, UA: >=500 mg/dL — AB
Hgb urine dipstick: NEGATIVE
Ketones, ur: 5 mg/dL — AB
Leukocytes,Ua: NEGATIVE
Nitrite: NEGATIVE
Protein, ur: NEGATIVE mg/dL
Specific Gravity, Urine: 1.042 — ABNORMAL HIGH (ref 1.005–1.030)
pH: 5 (ref 5.0–8.0)

## 2023-10-15 LAB — COMPREHENSIVE METABOLIC PANEL
ALT: 29 U/L (ref 0–44)
AST: 17 U/L (ref 15–41)
Albumin: 3.8 g/dL (ref 3.5–5.0)
Alkaline Phosphatase: 82 U/L (ref 38–126)
Anion gap: 12 (ref 5–15)
BUN: 14 mg/dL (ref 8–23)
CO2: 26 mmol/L (ref 22–32)
Calcium: 9.7 mg/dL (ref 8.9–10.3)
Chloride: 101 mmol/L (ref 98–111)
Creatinine, Ser: 0.76 mg/dL (ref 0.44–1.00)
GFR, Estimated: >60 mL/min (ref >60)
Glucose, Bld: 136 mg/dL — ABNORMAL HIGH (ref 70–99)
Potassium: 3.1 mmol/L — ABNORMAL LOW (ref 3.5–5.1)
Sodium: 139 mmol/L (ref 135–145)
Total Bilirubin: 0.6 mg/dL (ref 0.0–1.2)
Total Protein: 7.3 g/dL (ref 6.5–8.1)

## 2023-10-15 LAB — CBC WITH DIFFERENTIAL/PLATELET
Basophils Absolute: 0.1 10*3/uL (ref 0.0–0.1)
Basophils Relative: 1 %
Eosinophils Absolute: 0.1 10*3/uL (ref 0.0–0.5)
Eosinophils Relative: 0 %
HCT: 50.1 % — ABNORMAL HIGH (ref 36.0–46.0)
Hemoglobin: 16.4 g/dL — ABNORMAL HIGH (ref 12.0–15.0)
Lymphocytes Relative: 48 %
Lymphs Abs: 5.3 10*3/uL — ABNORMAL HIGH (ref 0.7–4.0)
MCH: 28.5 pg (ref 26.0–34.0)
MCHC: 32.7 g/dL (ref 30.0–36.0)
MCV: 87 fL (ref 80.0–100.0)
Monocytes Absolute: 0.5 10*3/uL (ref 0.1–1.0)
Monocytes Relative: 5 %
Neutro Abs: 5.1 10*3/uL (ref 1.7–7.7)
Neutrophils Relative %: 46 %
Platelets: 240 10*3/uL (ref 150–400)
RBC: 5.76 MIL/uL — ABNORMAL HIGH (ref 3.87–5.11)
RDW: 13.8 % (ref 11.5–15.5)
Smear Review: ADEQUATE
WBC: 11.1 10*3/uL — ABNORMAL HIGH (ref 4.0–10.5)
nRBC: 0 % (ref 0.0–0.2)
nRBC: 0 /100{WBCs}

## 2023-10-15 LAB — LIPASE, BLOOD: Lipase: 27 U/L (ref 11–51)

## 2023-10-15 MED ORDER — POTASSIUM CHLORIDE CRYS ER 20 MEQ PO TBCR
40.0000 meq | EXTENDED_RELEASE_TABLET | Freq: Once | ORAL | Status: AC
  Administered 2023-10-15: 40 meq via ORAL
  Filled 2023-10-15: qty 2

## 2023-10-15 MED ORDER — IOHEXOL 350 MG/ML SOLN
75.0000 mL | Freq: Once | INTRAVENOUS | Status: AC | PRN
  Administered 2023-10-15: 75 mL via INTRAVENOUS

## 2023-10-15 NOTE — ED Provider Notes (Signed)
[place] EMERGENCY DEPARTMENT AT [facility] Provider Note   CSN: [id number] Arrival date & time: 10/15/23  1124     History  Chief Complaint  Patient presents with   Abdominal Pain    Colleen Williams is a 68 y.o. female with medical history of hypertension, hyperlipidemia.  Patient presents to ED for evaluation of lower abdominal pain.  Patient reports that she received a letter in the mail today advising her that she had an abnormal Pap smear recently, this was sent by her OB/GYN.  She reports that upon reading this she became upset and immediately began having lower abdominal pain.  She is concerned that she has cervical cancer.  She came to the ED due to her lower abdominal pain but denies nausea, vomiting, diarrhea, vaginal discharge, dysuria, fevers.  Denies any vaginal bleeding.   Abdominal Pain Associated symptoms: no diarrhea, no dysuria, no fever, no nausea, no vaginal bleeding and no vomiting        Home Medications Prior to Admission medications   Medication Sig Start Date End Date Taking? Authorizing Provider  acetaminophen-codeine (TYLENOL #3) 300-30 MG tablet Take 1 tablet by mouth every 6 (six) hours. 04/29/22   [provider]  Blood Glucose Monitoring Suppl (ACCU-CHEK GUIDE) w/Device KIT See admin instructions. 01/21/22   [provider]  empagliflozin (JARDIANCE) 25 MG TABS tablet Take 1 tablet (25 mg total) by mouth daily before breakfast. 07/30/23   Colbert Coyer, Priscila, MD  glucose blood test strip Use four times daily with insulin as directed. 10/28/22   Evlyn Kanner, MD  glucose monitoring kit (FREESTYLE) monitoring kit 1 each by Does not apply route as needed for other. 01/21/22   Ellison Carwin, MD  hydrochlorothiazide (HYDRODIURIL) 12.5 MG tablet Take 1 tablet (12.5 mg total) by mouth daily. 07/26/22   Evlyn Kanner, MD  Lancets (FREESTYLE) lancets Use as instructed 05/06/23   Hassan Rowan, [place], MD  latanoprost  (XALATAN) 0.005 % ophthalmic solution Place 1 drop into both eyes at bedtime. 06/14/23   [provider]  lidocaine (LIDODERM) 5 % Place 1 patch onto the skin daily. Remove & Discard patch within 12 hours or as directed by MD 10/06/22   Ernie Avena, MD  losartan (COZAAR) 50 MG tablet Take 1 tablet (50 mg total) by mouth daily. 08/04/23   Philomena Doheny, MD  rosuvastatin (CRESTOR) 20 MG tablet Take 1 tablet (20 mg total) by mouth daily. 01/27/23 01/22/24  Rocky Morel, DO      Allergies    Patient has no known allergies.    Review of Systems   Review of Systems  Constitutional:  Negative for fever.  Gastrointestinal:  Positive for abdominal pain. Negative for diarrhea, nausea and vomiting.  Genitourinary:  Negative for dysuria and vaginal bleeding.  All other systems reviewed and are negative.   Physical Exam Updated Vital Signs BP 118/87   Pulse (!) 109   Temp 98 F (36.7 C)   Resp 18   Ht 5\' 5"  (1.651 m)   Wt 91.6 kg   LMP  (LMP Unknown)   SpO2 100%   BMI 33.61 kg/m  Physical Exam Vitals and nursing note reviewed.  Constitutional:      General: She is not in acute distress.    Appearance: Normal appearance. She is not ill-appearing, toxic-appearing or diaphoretic.  HENT:     Head: Normocephalic and atraumatic.     Nose: Nose normal.     Mouth/Throat:  Mouth: Mucous membranes are moist.     Pharynx: Oropharynx is clear.  Eyes:     Extraocular Movements: Extraocular movements intact.     Conjunctiva/sclera: Conjunctivae normal.     Pupils: Pupils are equal, round, and reactive to light.  Cardiovascular:     Rate and Rhythm: Normal rate and regular rhythm.  Pulmonary:     Effort: Pulmonary effort is normal.     Breath sounds: Normal breath sounds. No wheezing.  Abdominal:     General: Abdomen is flat. Bowel sounds are normal.     Palpations: Abdomen is soft.     Tenderness: There is abdominal tenderness.  Musculoskeletal:     Cervical back:  Normal range of motion and neck supple. No tenderness.  Skin:    General: Skin is warm and dry.     Capillary Refill: Capillary refill takes less than 2 seconds.  Neurological:     Mental Status: She is alert and oriented to person, place, and time.     ED Results / Procedures / Treatments   Labs (all labs ordered are listed, but only abnormal results are displayed) Labs Reviewed  COMPREHENSIVE METABOLIC PANEL - Abnormal; Notable for the following components:      Result Value   Potassium 3.1 (*)    Glucose, Bld 136 (*)    All other components within normal limits  CBC WITH DIFFERENTIAL/PLATELET - Abnormal; Notable for the following components:   WBC 11.1 (*)    RBC 5.76 (*)    Hemoglobin 16.4 (*)    HCT 50.1 (*)    Lymphs Abs 5.3 (*)    All other components within normal limits  URINALYSIS, ROUTINE W REFLEX MICROSCOPIC - Abnormal; Notable for the following components:   Specific Gravity, Urine 1.042 (*)    Glucose, UA >=500 (*)    Ketones, ur 5 (*)    All other components within normal limits  LIPASE, BLOOD    EKG None  Radiology US Pelvis Complete Result Date: 10/15/2023 CLINICAL DATA:  endometrial heterogeneity and thickening EXAM: TRANSABDOMINAL ULTRASOUND OF PELVIS TECHNIQUE: Transabdominal ultrasound examination of the pelvis was performed including evaluation of the uterus, ovaries, adnexal regions, and pelvic cul-de-sac. COMPARISON:  CT abdomen pelvis 10/15/2023 FINDINGS: Uterus Measurements: 7 x 3.8 x 5.9 cm = volume: 8.1 mL. Uterine fibroid measuring 2.9 cm. Endometrium Thickness: 18mm.  Heterogeneous peer Right ovary Not visualized. Left ovary Not visualized. Other findings:  No abnormal free fluid. IMPRESSION: 1. Heterogeneous thickened endometrium concerning for malignancy in a postmenopausal patient. Recommend gynecologic consult. 2. Uterine fibroid. 3. Transabdominal only ultrasound with bilateral ovaries not visualized. Electronically Signed   By: Tish Frederickson  M.D.   On: 10/15/2023 23:21   CT ABDOMEN PELVIS W CONTRAST Result Date: 10/15/2023 CLINICAL DATA:  Abdominal pain EXAM: CT ABDOMEN AND PELVIS WITH CONTRAST TECHNIQUE: Multidetector CT imaging of the abdomen and pelvis was performed using the standard protocol following bolus administration of intravenous contrast. RADIATION DOSE REDUCTION: This exam was performed according to the departmental dose-optimization program which includes automated exposure control, adjustment of the mA and/or kV according to patient size and/or use of iterative reconstruction technique. CONTRAST:  75mL OMNIPAQUE IOHEXOL 350 MG/ML SOLN COMPARISON:  CT abdomen and pelvis 06/28/2023 and 02/17/2023. FINDINGS: Lower chest: No acute abnormality. Hepatobiliary: There are 2 heterogeneous lesions in the liver with some peripheral enhancement. One is in the left lobe measuring 18 mm in the other is in the central right lobe measuring 14 mm. These are  incompletely characterize, but favored as hemangiomas. Gallbladder and bile ducts are within normal limits. Pancreas: Unremarkable. No pancreatic ductal dilatation or surrounding inflammatory changes. Spleen: Normal in size without focal abnormality. Adrenals/Urinary Tract: Adrenal glands are unremarkable. Kidneys are normal, without renal calculi, focal lesion, or hydronephrosis. Bladder is unremarkable. Stomach/Bowel: Stomach is within normal limits. Appendix appears normal. No evidence of bowel wall thickening, distention, or inflammatory changes. Vascular/Lymphatic: Aortic atherosclerosis. No enlarged abdominal or pelvic lymph nodes. Reproductive: Endometrial heterogeneity and thickening measuring up to 16 mm is unchanged. There is likely a right-sided uterine fibroid measuring 3.2 cm. The ovaries are within normal limits. Other: No abdominal wall hernia or abnormality. No abdominopelvic ascites. Musculoskeletal: There are degenerative changes of the lower lumbar spine. IMPRESSION: 1. No acute  localizing process in the abdomen or pelvis. 2. Stable endometrial heterogeneity and thickening. Recommend further evaluation with pelvic ultrasound. 3. Stable uterine fibroid. 4. Stable indeterminate hepatic lesions, favored as hemangiomas. These can be more definitively characterized with MRI. 5. Aortic atherosclerosis. Aortic Atherosclerosis (ICD10-I70.0). Electronically Signed   By: Darliss Cheney M.D.   On: 10/15/2023 20:47    Procedures Procedures    Medications Ordered in ED Medications  potassium chloride SA (KLOR-CON M) CR tablet 40 mEq (40 mEq Oral Given 10/15/23 1920)  iohexol (OMNIPAQUE) 350 MG/ML injection 75 mL (75 mLs Intravenous Contrast Given 10/15/23 1912)    ED Course/ Medical Decision Making/ A&P    Medical Decision Making Amount and/or Complexity of Data Reviewed Radiology: ordered.  Risk Prescription drug management.   68 year old female presents for evaluation.  Please see HPI for further details.  On exam patient is afebrile and nontachycardic.  Her lung sounds are clear bilaterally, she is not hypoxic.  Her abdomen has tenderness throughout that is nonfocal, no CVA tenderness, no rebound or guarding.  Neurological examination is baseline.  Patient CBC with a slight leukocytosis 11.1, no anemia.  Metabolic panel with potassium 3.1 repleted with 40 mEq oral potassium, glucose 136 however no other electrolyte derangement or elevated LFTs.  Urinalysis shows ketones and glucose.  Denies dysuria.  Lipase WNL.  CT abdomen pelvis shows stable endometrial heterogeneity and thickening, they are recommending ultrasound of the pelvis for further characterization.  This was ordered.  Ultrasound of pelvis shows a thickened endometrium concerning for malignancy in a postmenopausal patient.  Patient reports she has not had a period in over 10 years.  Advised patient she will need to follow-up with Dr. Tamela Oddi OB/GYN.  Will provide patient follow-up information.  Patient  discharged in stable condition.  Final Clinical Impression(s) / ED Diagnoses Final diagnoses:  Generalized abdominal pain  Thickened endometrium    Rx / DC Orders ED Discharge Orders     None         Al Decant, PA-C 10/15/23 2329    Anders Simmonds T, DO 10/16/23 1147

## 2023-10-15 NOTE — Discharge Instructions (Addendum)
It was a pleasure taking part in your care.  As we discussed, based on your ultrasound shows you have a thickened endometrium which necessitates further testing.  Please follow-up with Dr. Tamela Oddi.  Please call and make an appointment to be seen.  If you develop any new or worsening symptoms please return to the ED for further management and care.

## 2023-10-15 NOTE — ED Triage Notes (Signed)
Patient c/o abd pain that started after she got results from a pap smear that were abnormal. Patient has no pelvic pain, no changes to urine or BM. Patient reports diffuse pain, no specific worsening or improvement.

## 2023-10-15 NOTE — ED Provider Triage Note (Signed)
Emergency Medicine Provider Triage Evaluation Note  Colleen Williams , a 68 y.o. female  was evaluated in triage.  Pt complains of abdominal pain.  Review of Systems  Positive:  Negative:   Physical Exam  BP 120/86   Pulse 93   Temp 98.1 F (36.7 C)   Resp 16   LMP  (LMP Unknown)   SpO2 97%  Gen:   Awake, no distress   Resp:  Normal effort  MSK:   Moves extremities without difficulty  Other:    Medical Decision Making  Medically screening exam initiated at 12:55 PM.  Appropriate orders placed.  Colleen Williams was informed that the remainder of the evaluation will be completed by another provider, this initial triage assessment does not replace that evaluation, and the importance of remaining in the ED until their evaluation is complete.  Generalized abdominal pain since yesterday. Denies fever, nausea, vomiting, diarrhea, hematochezia, dysuria, hematuria. Last BM yesterday. Patient going into long story about how she had an abnormal PAP 4-5 years ago and just recently found out about it. Patient is upset about the PAP.   Dorthy Cooler, [place] 10/15/23 1257

## 2023-10-15 NOTE — ED Notes (Signed)
Patient transported to CT

## 2023-10-16 ENCOUNTER — Telehealth: Payer: Self-pay

## 2023-10-16 NOTE — Transitions of Care (Post Inpatient/ED Visit) (Signed)
   10/16/2023  Name: Colleen Williams MRN: [id number] DOB: 04/05/1956  Today's TOC FU Call Status: Today's TOC FU Call Status:: Successful TOC FU Call Completed TOC FU Call Complete Date: 10/16/23 Patient's Name and Date of Birth confirmed.  Transition Care Management Follow-up Telephone Call Date of Discharge: 10/15/23 Discharge Facility: [facility]) Type of Discharge: Emergency Department Reason for ED Visit: Other: (abd pain) How have you been since you were released from the hospital?: Better Any questions or concerns?: No  Items Reviewed: Did you receive and understand the discharge instructions provided?: Yes Medications obtained,verified, and reconciled?: Yes (Medications Reviewed) Any new allergies since your discharge?: No Dietary orders reviewed?: Yes  Medications Reviewed Today: Medications Reviewed Today     Reviewed by Karena Addison, LPN (Licensed Practical Nurse) on 10/16/23 at 1213  Med List Status: <None>   Medication Order Taking? Sig Documenting Provider Last Dose Status Informant  acetaminophen-codeine (TYLENOL #3) 300-30 MG tablet [id number] No Take 1 tablet by mouth every 6 (six) hours. [provider] Unknown Active   Blood Glucose Monitoring Suppl (ACCU-CHEK GUIDE) w/Device KIT [id number] No See admin instructions. [provider] Unknown Active   empagliflozin (JARDIANCE) 25 MG TABS tablet [id number]  Take 1 tablet (25 mg total) by mouth daily before breakfast. Colbert Coyer, Priscila, MD  Active   glucose blood test strip [id number] No Use four times daily with insulin as directed. Evlyn Kanner, MD Unknown Active   glucose monitoring kit (FREESTYLE) monitoring kit [id number] No 1 each by Does not apply route as needed for other. Ellison Carwin, MD Unknown Active   hydrochlorothiazide (HYDRODIURIL) 12.5 MG tablet [id number] No Take 1 tablet (12.5 mg total) by mouth daily. Evlyn Kanner, MD 07/06/2023 Active   Lancets (FREESTYLE)  lancets [id number] No Use as instructed Hassan Rowan, [place], MD Unknown Active   latanoprost (XALATAN) 0.005 % ophthalmic solution [id number] No Place 1 drop into both eyes at bedtime. [provider] 07/06/2023 Active   lidocaine (LIDODERM) 5 % [id number] No Place 1 patch onto the skin daily. Remove & Discard patch within 12 hours or as directed by MD Ernie Avena, MD Unknown Active   losartan (COZAAR) 50 MG tablet [id number]  Take 1 tablet (50 mg total) by mouth daily. Philomena Doheny, MD  Active   rosuvastatin (CRESTOR) 20 MG tablet [id number] No Take 1 tablet (20 mg total) by mouth daily. Rocky Morel, DO 07/06/2023 Active             Home Care and Equipment/Supplies: Were Home Health Services Ordered?: NA Any new equipment or medical supplies ordered?: NA  Functional Questionnaire: Do you need assistance with bathing/showering or dressing?: No Do you need assistance with meal preparation?: No Do you need assistance with eating?: No Do you have difficulty maintaining continence: No Do you need assistance with getting out of bed/getting out of a chair/moving?: No Do you have difficulty managing or taking your medications?: No  Follow up appointments reviewed: PCP Follow-up appointment confirmed?: NA Specialist Hospital Follow-up appointment confirmed?: No Reason Specialist Follow-Up Not Confirmed: Patient has Specialist Provider Number and will Call for Appointment Do you need transportation to your follow-up appointment?: No Do you understand care options if your condition(s) worsen?: Yes-patient verbalized understanding    SIGNATURE Karena Addison, LPN [facility] Nurse Health Advisor Direct Dial [phone]

## 2023-10-30 ENCOUNTER — Ambulatory Visit: Payer: 59 | Admitting: Student

## 2023-10-30 ENCOUNTER — Other Ambulatory Visit (HOSPITAL_COMMUNITY)
Admission: RE | Admit: 2023-10-30 | Discharge: 2023-10-30 | Disposition: A | Discharge status: Home / Self Care | Payer: 59 | Source: Ambulatory Visit | Attending: Student in an Organized Health Care Education/Training Program | Admitting: Student in an Organized Health Care Education/Training Program

## 2023-10-30 VITALS — BP 134/79 | HR 89 | Temp 97.7°F | Ht 65.0 in | Wt 206.4 lb

## 2023-10-30 DIAGNOSIS — F4320 Adjustment disorder, unspecified: Secondary | ICD-10-CM | POA: Diagnosis not present

## 2023-10-30 DIAGNOSIS — Z124 Encounter for screening for malignant neoplasm of cervix: Secondary | ICD-10-CM | POA: Insufficient documentation

## 2023-10-30 DIAGNOSIS — Z9889 Other specified postprocedural states: Secondary | ICD-10-CM | POA: Insufficient documentation

## 2023-10-30 DIAGNOSIS — D390 Neoplasm of uncertain behavior of uterus: Secondary | ICD-10-CM | POA: Diagnosis present

## 2023-10-30 DIAGNOSIS — Z0000 Encounter for general adult medical examination without abnormal findings: Secondary | ICD-10-CM

## 2023-10-30 DIAGNOSIS — E119 Type 2 diabetes mellitus without complications: Secondary | ICD-10-CM | POA: Diagnosis not present

## 2023-10-30 DIAGNOSIS — F430 Acute stress reaction: Secondary | ICD-10-CM | POA: Insufficient documentation

## 2023-10-30 DIAGNOSIS — Z7984 Long term (current) use of oral hypoglycemic drugs: Secondary | ICD-10-CM | POA: Diagnosis not present

## 2023-10-30 DIAGNOSIS — I10 Essential (primary) hypertension: Secondary | ICD-10-CM

## 2023-10-30 LAB — POCT GLYCOSYLATED HEMOGLOBIN (HGB A1C): Hemoglobin A1C: 6.8 % — AB (ref 4.0–5.6)

## 2023-10-30 LAB — GLUCOSE, CAPILLARY: Glucose-Capillary: 114 mg/dL — ABNORMAL HIGH (ref 70–99)

## 2023-10-30 MED ORDER — TRAMADOL HCL 50 MG PO TABS: 50.0000 mg | ORAL_TABLET | Freq: Four times a day (QID) | ORAL | 0 refills | Status: AC | PRN

## 2023-10-30 NOTE — Addendum Note (Signed)
Addended by: Manuela Neptune on: 10/30/2023 10:33 AM   Modules accepted: Orders

## 2023-10-30 NOTE — Assessment & Plan Note (Signed)
Hgb A1c improved today at 6.8. Will not make any adjustments at this time. Cw Jardiance 25mg  daily and statin 20mg .

## 2023-10-30 NOTE — Progress Notes (Signed)
CC:  Chief Complaint  Patient presents with   Follow-up    3 month fu visit for diabetes.   Back Pain   HPI:  [name] is a 68 y.o. female living with a history stated below and presents today for evaluation of abdominal pain and back pain. Please see problem based assessment and plan for additional details.  Past Medical History:  Diagnosis Date   Arthritis    Diabetes mellitus without complication (HCC)    Hyperlipidemia    Hypertension    Vitreous floaters of both eyes 08/03/2017    Current Outpatient Medications on File Prior to Visit  Medication Sig Dispense Refill   acetaminophen-codeine (TYLENOL #3) 300-30 MG tablet Take 1 tablet by mouth every 6 (six) hours.     Blood Glucose Monitoring Suppl (ACCU-CHEK GUIDE) w/Device KIT See admin instructions.     empagliflozin (JARDIANCE) 25 MG TABS tablet Take 1 tablet (25 mg total) by mouth daily before breakfast. 30 tablet 11   glucose blood test strip Use four times daily with insulin as directed. 200 each 5   glucose monitoring kit (FREESTYLE) monitoring kit 1 each by Does not apply route as needed for other. 1 each 1   hydrochlorothiazide (HYDRODIURIL) 12.5 MG tablet Take 1 tablet (12.5 mg total) by mouth daily. 90 tablet 3   Lancets (FREESTYLE) lancets Use as instructed 100 each 12   latanoprost (XALATAN) 0.005 % ophthalmic solution Place 1 drop into both eyes at bedtime.     lidocaine (LIDODERM) 5 % Place 1 patch onto the skin daily. Remove & Discard patch within 12 hours or as directed by MD 30 patch 0   losartan (COZAAR) 50 MG tablet Take 1 tablet (50 mg total) by mouth daily. 90 tablet 3   rosuvastatin (CRESTOR) 20 MG tablet Take 1 tablet (20 mg total) by mouth daily. 90 tablet 3   No current facility-administered medications on file prior to visit.    Family History  Problem Relation Age of Onset   Diabetes Mother    Colon cancer Neg Hx    Colon polyps Neg Hx    Esophageal cancer Neg Hx    Stomach cancer  Neg Hx    Rectal cancer Neg Hx    Breast cancer Neg Hx     Social History   Socioeconomic History   Marital status: Single    Spouse name: Not on file   Number of children: Not on file   Years of education: Not on file   Highest education level: Not on file  Occupational History   Not on file  Tobacco Use   Smoking status: Former    Current packs/day: 0.00    Types: Cigarettes    Quit date: 09/16/2008    Years since quitting: 15.1   Smokeless tobacco: Never  Vaping Use   Vaping status: Never Used  Substance and Sexual Activity   Alcohol use: Yes    Alcohol/week: 0.0 standard drinks of alcohol    Comment: Occasionally wine.   Drug use: No   Sexual activity: Not Currently    Birth control/protection: Surgical  Other Topics Concern   Not on file  Social History Narrative   Not on file   Social Drivers of Health   Financial Resource Strain: Low Risk  (04/24/2022)   Overall Financial Resource Strain (CARDIA)    Difficulty of Paying Living Expenses: Not hard at all  Food Insecurity: No Food Insecurity (04/29/2023)   Hunger Vital Sign  Worried About Programme researcher, broadcasting/film/video in the Last Year: Never true    Ran Out of Food in the Last Year: Never true  Transportation Needs: No Transportation Needs (04/24/2022)   PRAPARE - Administrator, Civil Service (Medical): No    Lack of Transportation (Non-Medical): No  Physical Activity: Insufficiently Active (07/24/2022)   Exercise Vital Sign    Days of Exercise per Week: 3 days    Minutes of Exercise per Session: 30 min  Stress: No Stress Concern Present (04/24/2022)   [employer] of Occupational Health - Occupational Stress Questionnaire    Feeling of Stress : Not at all  Social Connections: Moderately Integrated (04/24/2022)   Social Connection and Isolation Panel [NHANES]    Frequency of Communication with Friends and Family: Twice a week    Frequency of Social Gatherings with Friends and Family: Twice a week     Attends Religious Services: More than 4 times per year    Active Member of [employer] or Organizations: Yes    Attends Engineer, structural: More than 4 times per year    Marital Status: Never married  Intimate Partner Violence: Not At Risk (04/29/2023)   Humiliation, Afraid, Rape, and Kick questionnaire    Fear of Current or Ex-Partner: No    Emotionally Abused: No    Physically Abused: No    Sexually Abused: No   Review of Systems: ROS negative except for what is noted on the assessment and plan.  Vitals:   10/30/23 0819  BP: 134/79  Pulse: 89  Temp: 97.7 F (36.5 C)  TempSrc: Oral  SpO2: 98%  Weight: 206 lb 6.4 oz (93.6 kg)  Height: 5\' 5"  (1.651 m)   Physical Exam: Constitutional: well-appearing, in NAD HENT: normocephalic atraumatic, mucous membranes moist Eyes: conjunctiva non-erythematous Cardiovascular: regular rate and rhythm, no m/r/g Pulmonary/Chest: normal work of breathing on room air, lungs clear to auscultation bilaterally Abdominal: soft, non-tender, non-distended MSK: normal bulk and tone Neurological: alert & oriented x 3, no focal deficit Skin: warm and dry Psych: anxious, depressed mood     Assessment & Plan:   Patient discussed with Dr. 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 She is concerned because she had a visit at the ED where she was told she could potentially have endometrial cancer. CT of the abdomen showed a stable endometrial heterogeneity and thickening. [place] was done and showed endometrial thickening. Pt to follow up with Ob Gyn but does not have an appointment yet. She is very anxious and nervous about what is happening, understandably. Her first menstural period was at 71, she had two children, never got estrogen therapy. Denies any vaginal bleeding. On chart review it seems that she had vaginal spotting in 2019 and was referred to [facility] but she did not follow up. On exam she does  have an irregular mass in her cervix that is friable and hemorrhagic looking. Pap smear obtained, explaining to her that cervical cancer is different than endometrial cancer. She is overweight and physical exam limited by protuberant abdomen but she is more tense on the suprapubic region. Denies urinary symptoms. Cr WNL. Has lower back pain that sometimes is relieved at night. Has had unintentional weight loss >50lbs no other B symptoms. Called Ob Gyn and she will be seen at 8:45pm on 2/14 (tomorrow).   -Ob Gyn referral  -Pain management Tylenol, advil, and Tramadol 50mg  q6hrs PRN  Adjustment disorder Patient is anxious about cancer diagnosis. Is worried about the prognosis. Has not heard from ob Gyn since she learned about this on 10/15/2023. Offered appointment with our counselor, she declines at this time. Will continue to monitor for depression/anxiety.   Type 2 diabetes mellitus (HCC) Hgb A1c improved today at 6.8. Will not make any adjustments at this time. Cw Jardiance 25mg  daily and statin 20mg .   Essential hypertension Bp today is 134/79. Pt on Losartan 25mg  and hydrochlorothiazide 12.5mg  daily. Cw same.   Healthcare maintenance Pap smear done today, mammo and dexa sent. Will need PCV vaccine.   Manuela Neptune, MD [facility] Internal Medicine, PGY-1 Phone: [phone] Date 10/30/2023 Time 9:54 AM

## 2023-10-30 NOTE — Patient Instructions (Addendum)
Thank you, [name] for allowing [place] to provide your care today.   I have ordered the following labs for you:   Lab Orders         Glucose, capillary         POC Hbg A1C       Follow up:  1 month     Tylenol 1000mg  every 6 hours as needed  Ibuprofen - 600mg  every 6 hours as needed   Tramadol 50mg  every 6 hours as needed for severe pain   Follow up with Ob Gyn at 8:45AM tomorrow 10/31/2023:  [address] [place],  [place]  [address]  Should you have any questions or concerns please call the internal medicine clinic at [phone].    Manuela Neptune, MD [facility]

## 2023-10-30 NOTE — Assessment & Plan Note (Signed)
Patient is anxious about cancer diagnosis. Is worried about the prognosis. Has not heard from ob Gyn since she learned about this on 10/15/2023. Offered appointment with our counselor, she declines at this time. Will continue to monitor for depression/anxiety.

## 2023-10-30 NOTE — Progress Notes (Deleted)
CC: ***  HPI:  ColleenColleen Williams is a 68 y.o. female living with a history stated below and presents today for ***. Please see problem based assessment and plan for additional details.  Past Medical History:  Diagnosis Date   Arthritis    Diabetes mellitus without complication (HCC)    Hyperlipidemia    Hypertension    Vitreous floaters of both eyes 08/03/2017    Current Outpatient Medications on File Prior to Visit  Medication Sig Dispense Refill   acetaminophen-codeine (TYLENOL #3) 300-30 MG tablet Take 1 tablet by mouth every 6 (six) hours.     Blood Glucose Monitoring Suppl (ACCU-CHEK GUIDE) w/Device KIT See admin instructions.     empagliflozin (JARDIANCE) 25 MG TABS tablet Take 1 tablet (25 mg total) by mouth daily before breakfast. 30 tablet 11   glucose blood test strip Use four times daily with insulin as directed. 200 each 5   glucose monitoring kit (FREESTYLE) monitoring kit 1 each by Does not apply route as needed for other. 1 each 1   hydrochlorothiazide (HYDRODIURIL) 12.5 MG tablet Take 1 tablet (12.5 mg total) by mouth daily. 90 tablet 3   Lancets (FREESTYLE) lancets Use as instructed 100 each 12   latanoprost (XALATAN) 0.005 % ophthalmic solution Place 1 drop into both eyes at bedtime.     lidocaine (LIDODERM) 5 % Place 1 patch onto the skin daily. Remove & Discard patch within 12 hours or as directed by MD 30 patch 0   losartan (COZAAR) 50 MG tablet Take 1 tablet (50 mg total) by mouth daily. 90 tablet 3   rosuvastatin (CRESTOR) 20 MG tablet Take 1 tablet (20 mg total) by mouth daily. 90 tablet 3   No current facility-administered medications on file prior to visit.    Family History  Problem Relation Age of Onset   Diabetes Mother    Colon cancer Neg Hx    Colon polyps Neg Hx    Esophageal cancer Neg Hx    Stomach cancer Neg Hx    Rectal cancer Neg Hx    Breast cancer Neg Hx     Social History   Socioeconomic History   Marital status: Single     Spouse name: Not on file   Number of children: Not on file   Years of education: Not on file   Highest education level: Not on file  Occupational History   Not on file  Tobacco Use   Smoking status: Former    Current packs/day: 0.00    Types: Cigarettes    Quit date: 09/16/2008    Years since quitting: 15.1   Smokeless tobacco: Never  Vaping Use   Vaping status: Never Used  Substance and Sexual Activity   Alcohol use: Yes    Alcohol/week: 0.0 standard drinks of alcohol    Comment: Occasionally wine.   Drug use: No   Sexual activity: Not Currently    Birth control/protection: Surgical  Other Topics Concern   Not on file  Social History Narrative   Not on file   Social Drivers of Health   Financial Resource Strain: Low Risk  (04/24/2022)   Overall Financial Resource Strain (CARDIA)    Difficulty of Paying Living Expenses: Not hard at all  Food Insecurity: No Food Insecurity (04/29/2023)   Hunger Vital Sign    Worried About Running Out of Food in the Last Year: Never true    Ran Out of Food in the Last Year: Never true  Transportation Needs: No Transportation Needs (04/24/2022)   PRAPARE - Administrator, Civil Service (Medical): No    Lack of Transportation (Non-Medical): No  Physical Activity: Insufficiently Active (07/24/2022)   Exercise Vital Sign    Days of Exercise per Week: 3 days    Minutes of Exercise per Session: 30 min  Stress: No Stress Concern Present (04/24/2022)   [employer] of Occupational Health - Occupational Stress Questionnaire    Feeling of Stress : Not at all  Social Connections: Moderately Integrated (04/24/2022)   Social Connection and Isolation Panel [NHANES]    Frequency of Communication with Friends and Family: Twice a week    Frequency of Social Gatherings with Friends and Family: Twice a week    Attends Religious Services: More than 4 times per year    Active Member of [employer] or Organizations: Yes    Attends Hospital doctor: More than 4 times per year    Marital Status: Never married  Intimate Partner Violence: Not At Risk (04/29/2023)   Humiliation, Afraid, Rape, and Kick questionnaire    Fear of Current or Ex-Partner: No    Emotionally Abused: No    Physically Abused: No    Sexually Abused: No    Review of Systems: ROS negative except for what is noted on the assessment and plan.  Vitals:   10/30/23 0819  BP: 134/79  Pulse: 89  Temp: 97.7 F (36.5 C)  TempSrc: Oral  SpO2: 98%  Weight: 206 lb 6.4 oz (93.6 kg)  Height: 5\' 5"  (1.651 m)    Physical Exam: Constitutional: well-appearing *** sitting in ***, in no acute distress HENT: normocephalic atraumatic, mucous membranes moist Eyes: conjunctiva non-erythematous Cardiovascular: regular rate and rhythm, no m/r/g Pulmonary/Chest: normal work of breathing on room air, lungs clear to auscultation bilaterally Abdominal: soft, non-tender, non-distended MSK: normal bulk and tone Neurological: alert & oriented x 3, no focal deficit Skin: warm and dry Psych: normal mood and behavior  Assessment & Plan:    Patient {GC/GE:[id number]::"discussed with","seen with"} Dr. {UXLKG:[id number]::"ZDGUYQIH","K. Hoffman","Mullen","Narendra","Vincent","Guilloud","Lau","Machen"}  No problem-specific Assessment & Plan notes found for this encounter.   Abdominal pain for about a couple weeks. Hurts. Pain about 7/10, dull pain. Stays in the lower abdomen.    Back pain. Cramps, really heavy. Regular period. 15 at menarche, 2 kids.    Lost weight unintentionally. No B symptoms.     Back pain.        10/30/2023    8:19 AM 10/15/2023    9:15 PM 10/15/2023    9:00 PM  Vitals with BMI  Height 5\' 5"     Weight 206 lbs 6 oz    BMI 34.35    Systolic 134 118 742  Diastolic 79 87 83  Pulse 89 109 110        Estrogen, family history denies.   Colonoscopy was this year.          Nervous      Colleen Neptune, MD [facility]  Internal Medicine, PGY-1 Phone: [phone] Date 10/30/2023 Time 8:38 AM

## 2023-10-30 NOTE — Assessment & Plan Note (Addendum)
Pt came in for her Pap smear today. She is concerned because she had a visit at the ED where she was told she could potentially have endometrial cancer. CT of the abdomen showed a stable endometrial heterogeneity and thickening. [place] was done and showed endometrial thickening. Pt to follow up with Ob Gyn but does not have an appointment yet. She is very anxious and nervous about what is happening, understandably. Her first menstural period was at 31, she had two children, never got estrogen therapy. Denies any vaginal bleeding. On chart review it seems that she had vaginal spotting in 2019 and was referred to [facility] but she did not follow up. On exam she does have an irregular mass in her cervix that is friable and hemorrhagic looking. Pap smear obtained, explaining to her that cervical cancer is different than endometrial cancer. She is overweight and physical exam limited by protuberant abdomen but she is more tense on the suprapubic region. Denies urinary symptoms. Cr WNL. Has lower back pain that sometimes is relieved at night. Has had unintentional weight loss >50lbs no other B symptoms. Called Ob Gyn and she will be seen at 8:45pm on 2/14 (tomorrow).   -Ob Gyn referral  -Pain management Tylenol, advil, and Tramadol 50mg  q6hrs PRN

## 2023-10-30 NOTE — Assessment & Plan Note (Signed)
Bp today is 134/79. Pt on Losartan 25mg  and hydrochlorothiazide 12.5mg  daily. Cw same.

## 2023-10-30 NOTE — Assessment & Plan Note (Signed)
Pap smear done today, mammo and dexa sent. Will need PCV vaccine.

## 2023-10-31 ENCOUNTER — Ambulatory Visit: Payer: 59 | Admitting: Family Medicine

## 2023-10-31 ENCOUNTER — Encounter: Payer: Self-pay | Admitting: Family Medicine

## 2023-10-31 ENCOUNTER — Other Ambulatory Visit (HOSPITAL_COMMUNITY)
Admission: RE | Admit: 2023-10-31 | Discharge: 2023-10-31 | Disposition: A | Discharge status: Home / Self Care | Payer: 59 | Source: Ambulatory Visit | Attending: Family Medicine | Admitting: Family Medicine

## 2023-10-31 ENCOUNTER — Other Ambulatory Visit: Payer: Self-pay

## 2023-10-31 VITALS — BP 137/88 | HR 89 | Wt 205.2 lb

## 2023-10-31 DIAGNOSIS — R9389 Abnormal findings on diagnostic imaging of other specified body structures: Secondary | ICD-10-CM | POA: Diagnosis not present

## 2023-10-31 DIAGNOSIS — Z1331 Encounter for screening for depression: Secondary | ICD-10-CM | POA: Diagnosis not present

## 2023-10-31 DIAGNOSIS — N841 Polyp of cervix uteri: Secondary | ICD-10-CM

## 2023-10-31 LAB — CYTOLOGY - PAP: Diagnosis: NEGATIVE

## 2023-10-31 NOTE — Assessment & Plan Note (Signed)
s/p EMB, check pathology and treat accordingly

## 2023-10-31 NOTE — Progress Notes (Signed)
Internal Medicine Clinic Attending  Case discussed with the resident at the time of the visit.  We reviewed the resident's history and exam and pertinent patient test results.  I agree with the assessment, diagnosis, and plan of care documented in the resident's note.   Concern for uterine malignancy on imaging; also visualized on physical exam per resident. We have arranged urgent OBGYN follow up.

## 2023-10-31 NOTE — Progress Notes (Signed)
   Subjective:    Patient ID: Colleen Williams is a 68 y.o. female presenting with Gynecologic Exam  on 10/31/2023  HPI: Reports getting results of abnl pap with PCP. Went to the ED, where CT and pelvic sonogram showed thickened lining. Saw PCP yesterday and noted cervical polyp. Pap done yesterday is un resulted. Has h/o SVD x 2 and menopause approx. 10 years ago. PM with spotting only in 2019 and none since.  Review of Systems  Constitutional:  Negative for chills and fever.  Respiratory:  Negative for shortness of breath.   Cardiovascular:  Negative for chest pain.  Gastrointestinal:  Negative for abdominal pain, nausea and vomiting.  Genitourinary:  Negative for dysuria.  Skin:  Negative for rash.      Objective:    BP 137/88   Pulse 89   Wt 205 lb 3.2 oz (93.1 kg)   LMP  (LMP Unknown)   BMI 34.15 kg/m  Physical Exam Exam conducted with a chaperone present.  Constitutional:      General: She is not in acute distress.    Appearance: She is well-developed.  HENT:     Head: Normocephalic and atraumatic.  Eyes:     General: No scleral icterus. Cardiovascular:     Rate and Rhythm: Normal rate.  Pulmonary:     Effort: Pulmonary effort is normal.  Abdominal:     Palpations: Abdomen is soft.  Genitourinary:    General: Normal vulva.     Vagina: No vaginal discharge.     Cervix: Lesion present. Discharge: erythematous, polypoid. Musculoskeletal:     Cervical back: Neck supple.  Skin:    General: Skin is warm and dry.  Neurological:     Mental Status: She is alert and oriented to person, place, and time.    Procedure: Cervix visualized and polyp noted.  After pap smear obtained.  Ring forcep applied to cervix and twisting motion removed polyp intact. Excellent hemostasis.  Procedure: Patient given informed consent, signed copy in the chart, time out was performed. Appropriate time out taken. . The patient was placed in the lithotomy position and the cervix brought into  view with sterile speculum.  Portio of cervix cleansed x 2 with betadine swabs.  A tenaculum was placed in the anterior lip of the cervix.  The uterus was sounded for depth of 8 cm. A pipelle was introduced to into the uterus, suction created,  and an endometrial sample was obtained. All equipment was removed and accounted for.  The patient tolerated the procedure well.        Assessment & Plan:   Problem List Items Addressed This Visit       Unprioritized   Thickened endometrium - Primary   s/p EMB, check pathology and treat accordingly       Relevant Orders   Surgical pathology( [place]/ [facility])   Other Visit Diagnoses       Cervical polyp       s/p removal.   Relevant Orders   Surgical pathology( [place]/ [facility])        Return if symptoms worsen or fail to improve.  Reva Bores, MD 10/31/2023 8:35 AM

## 2023-11-03 NOTE — Progress Notes (Signed)
 Called patient to notify results of her pap smear. Saw ObGyn on the 14th and path results and endometrial EMB are pending.

## 2023-11-04 LAB — SURGICAL PATHOLOGY

## 2023-11-05 ENCOUNTER — Encounter: Payer: Self-pay | Admitting: Family Medicine

## 2023-11-06 ENCOUNTER — Telehealth: Payer: Self-pay | Admitting: *Deleted

## 2023-11-06 ENCOUNTER — Encounter: Payer: Self-pay | Admitting: General Practice

## 2023-11-06 NOTE — Telephone Encounter (Signed)
 Call from pt who stated she saw Dr Hassan Rowan on 2/13 for left sided /back pain. Stated Tramadol help the pain but she wants to know what is causing the pain.Stated she would like a call back from the doctor.

## 2023-11-07 ENCOUNTER — Ambulatory Visit: Payer: 59 | Admitting: Student

## 2023-11-07 VITALS — BP 134/89 | HR 106 | Temp 98.1°F | Ht 65.0 in | Wt 203.6 lb

## 2023-11-07 DIAGNOSIS — K769 Liver disease, unspecified: Secondary | ICD-10-CM

## 2023-11-07 DIAGNOSIS — E785 Hyperlipidemia, unspecified: Secondary | ICD-10-CM

## 2023-11-07 DIAGNOSIS — R109 Unspecified abdominal pain: Secondary | ICD-10-CM

## 2023-11-07 DIAGNOSIS — E1169 Type 2 diabetes mellitus with other specified complication: Secondary | ICD-10-CM | POA: Diagnosis not present

## 2023-11-07 MED ORDER — IBUPROFEN 200 MG PO TABS: 200.0000 mg | ORAL_TABLET | Freq: Four times a day (QID) | ORAL | 0 refills | Status: DC | PRN

## 2023-11-07 MED ORDER — ACETAMINOPHEN 500 MG PO TABS: 1000.0000 mg | ORAL_TABLET | Freq: Four times a day (QID) | ORAL | 0 refills | Status: DC | PRN

## 2023-11-07 NOTE — Patient Instructions (Signed)
 Thank you, Ms.Cheronda Leodis Binet for allowing [place] to provide your care today. Today we discussed your right-sided pain that has been persistent despite tramadol.  I will send you for a follow-up MRI to further assess hepatic lesions that was seen on your CT scan.  Please continue to use Tylenol and ibuprofen for pain control at this time.  Will call you and follow-up with you once the MRI read comes back.  I have ordered the following labs for you:  Lab Orders  No laboratory test(s) ordered today     Tests ordered today:    Referrals ordered today:   Referral Orders  No referral(s) requested today     I have ordered the following medication/changed the following medications:   Stop the following medications: There are no discontinued medications.   Start the following medications: Meds ordered this encounter  Medications   acetaminophen (TYLENOL) 500 MG tablet    Sig: Take 2 tablets (1,000 mg total) by mouth every 6 (six) hours as needed.    Dispense:  100 tablet    Refill:  0   ibuprofen (ADVIL) 200 MG tablet    Sig: Take 1 tablet (200 mg total) by mouth every 6 (six) hours as needed.    Dispense:  30 tablet    Refill:  0     Follow up: 2-3 months   Remember:   Should you have any questions or concerns please call the internal medicine clinic at [phone].    Kathleen Lime, M.D [facility]

## 2023-11-07 NOTE — Telephone Encounter (Signed)
 RTC to patient Dr. Mickie Bail is going to see patient this morning to discuss need for refill of pain med.

## 2023-11-07 NOTE — Telephone Encounter (Signed)
 RTC to patient states is still having the pain in her right side.  Said that the 1 Tramadol does not help.  Has been taking 2.  Would like a call to see why she is in so much pain and needs a refill as she is out of the Tramadol since she has to take 2 at a time for relief.

## 2023-11-07 NOTE — Assessment & Plan Note (Signed)
 On rosuvastatin 20 mg daily. No active concerns with the medications. - Continue rosuvastatin 20 mg daily

## 2023-11-07 NOTE — Telephone Encounter (Signed)
 Pt calling back states she is pain, requesting to speak with a nurse. She wants to know what is causing her pain. Please call back.

## 2023-11-07 NOTE — Assessment & Plan Note (Addendum)
 Patient presents due to right sided pain for the past week.  Patient was initially seen in the office a week ago and was prescribed tramadol for pain control.  Reports she has been doubling up her tramadol dose to control her pain and has almost run out.  On exam today ,patient report her pain is 8 out of 10 and it gets worse when she walks. Reports pain is better when she stays still.  She has a history of abnormal Pap smear but a follow-up assessment and biopsy with OB/GYN was reassuring with no evidence of malignancy was seen at that time.  I worry  that this patient might have gallbladder issues versus appendicitis versus hepatitis but CT of the abdomen done on 10/15/2023 was reassuring as well.It did however showed stable uterine fibroid and stable indeterminate hepatic lesions, favored as hemangiomas.  I wonder if these these hepatic hemangiomas are causing her pain even though its less likely. Hemangiomas rarely causes pain. Since the liver lesions on the CT scan was indeterminate, I will follow-up with an MRI to further characterize this hepatic lesions.  Negative Faber test on exam, low concerns for hip pathology. Per chat review, it seems like this patient has had multiple ED visits for flank pain but no etiology has been identified.  -Will recommend patient takes Tylenol and NSAIDs for pain control and follow-up with her once the MRI read is back.

## 2023-11-07 NOTE — Progress Notes (Signed)
 CC: Ride sided pain   HPI:  [name] is a 68 y.o. female living with a history stated below and presents today for right side pain.  Patient reports she has been having right-sided pain for the past couple of days and had to double on her tramadol dose for adequate pain control.  Patient request that her tramadol be refilled.  I requested to see the patient in the office to further assess her pain and see if tramadol is indicated at this time.  Please see problem based assessment and plan for additional details.  Past Medical History:  Diagnosis Date   Arthritis    Diabetes mellitus without complication (HCC)    Hyperlipidemia    Hypertension    Vitreous floaters of both eyes 08/03/2017    Current Outpatient Medications on File Prior to Visit  Medication Sig Dispense Refill   acetaminophen-codeine (TYLENOL #3) 300-30 MG tablet Take 1 tablet by mouth every 6 (six) hours.     Blood Glucose Monitoring Suppl (ACCU-CHEK GUIDE) w/Device KIT See admin instructions.     empagliflozin (JARDIANCE) 25 MG TABS tablet Take 1 tablet (25 mg total) by mouth daily before breakfast. 30 tablet 11   glucose blood test strip Use four times daily with insulin as directed. 200 each 5   glucose monitoring kit (FREESTYLE) monitoring kit 1 each by Does not apply route as needed for other. 1 each 1   hydrochlorothiazide (HYDRODIURIL) 12.5 MG tablet Take 1 tablet (12.5 mg total) by mouth daily. 90 tablet 3   Lancets (FREESTYLE) lancets Use as instructed 100 each 12   latanoprost (XALATAN) 0.005 % ophthalmic solution Place 1 drop into both eyes at bedtime.     lidocaine (LIDODERM) 5 % Place 1 patch onto the skin daily. Remove & Discard patch within 12 hours or as directed by MD (Patient not taking: Reported on 10/31/2023) 30 patch 0   losartan (COZAAR) 50 MG tablet Take 1 tablet (50 mg total) by mouth daily. 90 tablet 3   rosuvastatin (CRESTOR) 20 MG tablet Take 1 tablet (20 mg total) by mouth daily. 90  tablet 3   traMADol (ULTRAM) 50 MG tablet Take 1 tablet (50 mg total) by mouth every 6 (six) hours as needed. 60 tablet 0   No current facility-administered medications on file prior to visit.    Family History  Problem Relation Age of Onset   Diabetes Mother    Colon cancer Neg Hx    Colon polyps Neg Hx    Esophageal cancer Neg Hx    Stomach cancer Neg Hx    Rectal cancer Neg Hx    Breast cancer Neg Hx     Social History   Socioeconomic History   Marital status: Single    Spouse name: Not on file   Number of children: Not on file   Years of education: Not on file   Highest education level: Not on file  Occupational History   Not on file  Tobacco Use   Smoking status: Former    Current packs/day: 0.00    Types: Cigarettes    Quit date: 09/16/2008    Years since quitting: 15.1   Smokeless tobacco: Never  Vaping Use   Vaping status: Never Used  Substance and Sexual Activity   Alcohol use: Yes    Alcohol/week: 0.0 standard drinks of alcohol    Comment: Occasionally wine.   Drug use: No   Sexual activity: Not Currently    Birth  control/protection: Surgical  Other Topics Concern   Not on file  Social History Narrative   Not on file   Social Drivers of Health   Financial Resource Strain: Low Risk  (04/24/2022)   Overall Financial Resource Strain (CARDIA)    Difficulty of Paying Living Expenses: Not hard at all  Food Insecurity: No Food Insecurity (10/31/2023)   Hunger Vital Sign    Worried About Running Out of Food in the Last Year: Never true    Ran Out of Food in the Last Year: Never true  Transportation Needs: No Transportation Needs (10/31/2023)   PRAPARE - Administrator, Civil Service (Medical): No    Lack of Transportation (Non-Medical): No  Physical Activity: Insufficiently Active (07/24/2022)   Exercise Vital Sign    Days of Exercise per Week: 3 days    Minutes of Exercise per Session: 30 min  Stress: No Stress Concern Present (04/24/2022)    [employer] of Occupational Health - Occupational Stress Questionnaire    Feeling of Stress : Not at all  Social Connections: Moderately Integrated (04/24/2022)   Social Connection and Isolation Panel [NHANES]    Frequency of Communication with Friends and Family: Twice a week    Frequency of Social Gatherings with Friends and Family: Twice a week    Attends Religious Services: More than 4 times per year    Active Member of [employer] or Organizations: Yes    Attends Engineer, structural: More than 4 times per year    Marital Status: Never married  Intimate Partner Violence: Not At Risk (04/29/2023)   Humiliation, Afraid, Rape, and Kick questionnaire    Fear of Current or Ex-Partner: No    Emotionally Abused: No    Physically Abused: No    Sexually Abused: No    Review of Systems: ROS negative except for what is noted on the assessment and plan.  Vitals:   11/07/23 1105  BP: 134/89  Pulse: (!) 106  Temp: 98.1 F (36.7 C)  TempSrc: Oral  SpO2: 97%  Weight: 203 lb 9.6 oz (92.4 kg)  Height: 5\' 5"  (1.651 m)    Physical Exam: Constitutional: well-appearing woman, sitting in chair , in no acute distress Cardiovascular: regular rate and rhythm, no m/r/g Pulmonary/Chest: normal work of breathing on room air, lungs clear to auscultation bilaterally Abdominal: Site of pain is non-erythematous with no bruises.No underlying masses appreciated.No CVA tenderness noted. MSK: Negative Faber test  Neurological: alert & oriented x 3, no focal deficit Skin: warm and dry Psych: normal mood and behavior  Assessment & Plan:   Right sided abdominal pain Patient presents due to right sided pain for the past week.  Patient was initially seen in the office a week ago and was prescribed tramadol for pain control.  Reports she has been doubling up her tramadol dose to control her pain and has almost run out.  On exam today ,patient report her pain is 8 out of 10 and it gets worse when she  walks. Reports pain is better when she stays still.  She has a history of abnormal Pap smear but a follow-up assessment and biopsy with OB/GYN was reassuring with no evidence of malignancy was seen at that time.  I worry  that this patient might have gallbladder issues versus appendicitis versus hepatitis but CT of the abdomen done on 10/15/2023 was reassuring as well.It did however showed stable uterine fibroid and stable indeterminate hepatic lesions, favored as hemangiomas.  I wonder  if these these hepatic hemangiomas are causing her pain even though its less likely. Hemangiomas rarely causes pain. Since the liver lesions on the CT scan was indeterminate, I will follow-up with an MRI to further characterize this hepatic lesions.  Negative Faber test on exam, low concerns for hip pathology. Per chat review, it seems like this patient has had multiple ED visits for flank pain but no etiology has been identified.  -Will recommend patient takes Tylenol and NSAIDs for pain control and follow-up with her once the MRI read is back.  Hyperlipidemia associated with type 2 diabetes mellitus (HCC) On rosuvastatin 20 mg daily. No active concerns with the medications. - Continue rosuvastatin 20 mg daily     Patient discussed with Dr. Lavonna Monarch, M.D [facility] Phone: [phone] Date 11/07/2023 Time 1:12 PM

## 2023-11-17 NOTE — Progress Notes (Signed)
 Internal Medicine Clinic Attending  Case discussed with the resident at the time of the visit.  We reviewed the resident's history and exam and pertinent patient test results.  I agree with the assessment, diagnosis, and plan of care documented in the resident's note.

## 2023-11-19 ENCOUNTER — Telehealth: Payer: Self-pay | Admitting: *Deleted

## 2023-11-19 NOTE — Telephone Encounter (Signed)
 Mammogram appointment  02-24-2024 @ 7:00 am /appointment already scheduled.

## 2023-11-19 NOTE — Telephone Encounter (Signed)
 DEXA appointmennt November 3.2025 @ 11:30 am to arrive 11:10 am / appointment mailed to the patient /information regarding the 75.00 no show fee and to contact the breast center to reschedule or to cancel the appointment 24 hour prior appointment.

## 2023-11-24 ENCOUNTER — Other Ambulatory Visit: Payer: Self-pay | Admitting: Student

## 2023-11-24 DIAGNOSIS — I10 Essential (primary) hypertension: Secondary | ICD-10-CM

## 2023-11-24 MED ORDER — HYDROCHLOROTHIAZIDE 12.5 MG PO TABS: 12.5000 mg | ORAL_TABLET | Freq: Every day | ORAL | 3 refills | Status: AC

## 2023-11-24 NOTE — Telephone Encounter (Signed)
 Last Fill: 07/26/22  Last OV: 07/30/23 Next OV: None Scheduled  Routing to provider for review/authorization.

## 2023-11-24 NOTE — Telephone Encounter (Signed)
 Copied from CRM [id number]. Topic: Clinical - Medication Refill >> Nov 24, 2023  3:28 PM Louie Boston wrote: Most Recent Primary Care Visit:  Provider: Kathleen Lime  Department: [facility]  Visit Type: OPEN ESTABLISHED  Date: 11/07/2023  Medication: hydrochlorothiazide (HYDRODIURIL) 12.5 MG tablet  Has the patient contacted their pharmacy? No (Agent: If no, request that the patient contact the pharmacy for the refill. If patient does not wish to contact the pharmacy document the reason why and proceed with request.) (Agent: If yes, when and what did the pharmacy advise?)  Patient pill bottle shows no refills. Patient called to request refill.   Is this the correct pharmacy for this prescription? Yes If no, delete pharmacy and type the correct one.  This is the patient's preferred pharmacy:  [facility] [address] ([address]), [place] - [address] [place] ([place]) [place] [address] Phone: [phone] Fax: [phone]   Has the prescription been filled recently? No  Is the patient out of the medication? Yes  Has the patient been seen for an appointment in the last year OR does the patient have an upcoming appointment? Yes  Can we respond through MyChart? Yes  Agent: Please be advised that Rx refills may take up to 3 business days. We ask that you follow-up with your pharmacy.

## 2023-11-28 ENCOUNTER — Other Ambulatory Visit: Payer: Self-pay

## 2023-11-28 ENCOUNTER — Emergency Department (HOSPITAL_COMMUNITY)
Admission: EM | Admit: 2023-11-28 | Discharge: 2023-11-28 | Disposition: A | Discharge status: Home / Self Care | Attending: Emergency Medicine | Admitting: Emergency Medicine

## 2023-11-28 ENCOUNTER — Encounter (HOSPITAL_COMMUNITY): Payer: Self-pay

## 2023-11-28 ENCOUNTER — Emergency Department (HOSPITAL_COMMUNITY)

## 2023-11-28 DIAGNOSIS — I700 Atherosclerosis of aorta: Secondary | ICD-10-CM | POA: Insufficient documentation

## 2023-11-28 DIAGNOSIS — R932 Abnormal findings on diagnostic imaging of liver and biliary tract: Secondary | ICD-10-CM | POA: Diagnosis not present

## 2023-11-28 DIAGNOSIS — K4090 Unilateral inguinal hernia, without obstruction or gangrene, not specified as recurrent: Secondary | ICD-10-CM | POA: Diagnosis not present

## 2023-11-28 DIAGNOSIS — M549 Dorsalgia, unspecified: Secondary | ICD-10-CM | POA: Diagnosis not present

## 2023-11-28 DIAGNOSIS — M5442 Lumbago with sciatica, left side: Secondary | ICD-10-CM | POA: Diagnosis not present

## 2023-11-28 DIAGNOSIS — M5136 Other intervertebral disc degeneration, lumbar region with discogenic back pain only: Secondary | ICD-10-CM | POA: Diagnosis not present

## 2023-11-28 DIAGNOSIS — M79605 Pain in left leg: Secondary | ICD-10-CM | POA: Diagnosis not present

## 2023-11-28 DIAGNOSIS — M47816 Spondylosis without myelopathy or radiculopathy, lumbar region: Secondary | ICD-10-CM | POA: Diagnosis not present

## 2023-11-28 DIAGNOSIS — K769 Liver disease, unspecified: Secondary | ICD-10-CM | POA: Diagnosis not present

## 2023-11-28 DIAGNOSIS — R109 Unspecified abdominal pain: Secondary | ICD-10-CM | POA: Diagnosis not present

## 2023-11-28 DIAGNOSIS — M545 Low back pain, unspecified: Secondary | ICD-10-CM | POA: Diagnosis present

## 2023-11-28 LAB — CBC
HCT: 49.2 % — ABNORMAL HIGH (ref 36.0–46.0)
Hemoglobin: 16.2 g/dL — ABNORMAL HIGH (ref 12.0–15.0)
MCH: 28.6 pg (ref 26.0–34.0)
MCHC: 32.9 g/dL (ref 30.0–36.0)
MCV: 86.8 fL (ref 80.0–100.0)
Platelets: 261 10*3/uL (ref 150–400)
RBC: 5.67 MIL/uL — ABNORMAL HIGH (ref 3.87–5.11)
RDW: 14.2 % (ref 11.5–15.5)
WBC: 9.4 10*3/uL (ref 4.0–10.5)
nRBC: 0 % (ref 0.0–0.2)

## 2023-11-28 LAB — URINALYSIS, ROUTINE W REFLEX MICROSCOPIC
Bacteria, UA: NONE SEEN
Bilirubin Urine: NEGATIVE
Glucose, UA: >=500 mg/dL — AB
Hgb urine dipstick: NEGATIVE
Ketones, ur: 5 mg/dL — AB
Leukocytes,Ua: NEGATIVE
Nitrite: NEGATIVE
Protein, ur: NEGATIVE mg/dL
Specific Gravity, Urine: 1.037 — ABNORMAL HIGH (ref 1.005–1.030)
pH: 6 (ref 5.0–8.0)

## 2023-11-28 LAB — COMPREHENSIVE METABOLIC PANEL
ALT: 42 U/L (ref 0–44)
AST: 23 U/L (ref 15–41)
Albumin: 3.8 g/dL (ref 3.5–5.0)
Alkaline Phosphatase: 85 U/L (ref 38–126)
Anion gap: 13 (ref 5–15)
BUN: 15 mg/dL (ref 8–23)
CO2: 24 mmol/L (ref 22–32)
Calcium: 9.4 mg/dL (ref 8.9–10.3)
Chloride: 101 mmol/L (ref 98–111)
Creatinine, Ser: 0.66 mg/dL (ref 0.44–1.00)
GFR, Estimated: >60 mL/min (ref >60)
Glucose, Bld: 113 mg/dL — ABNORMAL HIGH (ref 70–99)
Potassium: 4.1 mmol/L (ref 3.5–5.1)
Sodium: 138 mmol/L (ref 135–145)
Total Bilirubin: 0.6 mg/dL (ref 0.0–1.2)
Total Protein: 7 g/dL (ref 6.5–8.1)

## 2023-11-28 LAB — LIPASE, BLOOD: Lipase: 27 U/L (ref 11–51)

## 2023-11-28 MED ORDER — KETOROLAC TROMETHAMINE 15 MG/ML IJ SOLN
15.0000 mg | Freq: Once | INTRAMUSCULAR | Status: AC
  Administered 2023-11-28: 15 mg via INTRAMUSCULAR
  Filled 2023-11-28: qty 1

## 2023-11-28 MED ORDER — OXYCODONE HCL 5 MG PO TABS
5.0000 mg | ORAL_TABLET | Freq: Once | ORAL | Status: AC
  Administered 2023-11-28: 5 mg via ORAL
  Filled 2023-11-28: qty 1

## 2023-11-28 MED ORDER — DICLOFENAC SODIUM 1 % EX GEL: 4.0000 g | Freq: Four times a day (QID) | CUTANEOUS | 0 refills | Status: DC

## 2023-11-28 MED ORDER — ACETAMINOPHEN 500 MG PO TABS
1000.0000 mg | ORAL_TABLET | Freq: Once | ORAL | Status: AC
  Administered 2023-11-28: 1000 mg via ORAL
  Filled 2023-11-28: qty 2

## 2023-11-28 NOTE — ED Provider Notes (Signed)
 Patient signed to be by Dr. Adela Lank pending results of CT renal stone which did not show any acute findings.  Patient will be discharged   Lorre Nick, MD 11/28/23 1721

## 2023-11-28 NOTE — Discharge Instructions (Signed)

## 2023-11-28 NOTE — ED Triage Notes (Signed)
 Pt c/o falling 5 days ago. Pt started having left flank pain and leg pain yesterday. Pt denies numbness or tingling.

## 2023-11-28 NOTE — ED Notes (Signed)
 ED Provider at bedside.

## 2023-11-28 NOTE — ED Provider Notes (Signed)
 [place] EMERGENCY DEPARTMENT AT [facility] Provider Note   CSN: [id number] Arrival date & time: 11/28/23  1250     History  Chief Complaint  Patient presents with   Colleen Williams Height is a 68 y.o. female.  68 yo F with a chief complaints of left-sided back and leg pain.  This been going on for a couple days.  Seems to come and go.  Nothing that she knows seems to make it better or worse.  She has little bit of a burning sensation to her lower abdomen.  Denies urinary symptoms denies fevers denies nausea vomiting.  Denies loss of bowel or bladder denies loss of sensation denies numbness or weakness to the leg.  She did fall about 4 days ago.  Denied any injury.  Was not having any back pain or leg pain at that time.   Fall       Home Medications Prior to Admission medications   Medication Sig Start Date End Date Taking? Authorizing Provider  diclofenac Sodium (VOLTAREN) 1 % GEL Apply 4 g topically 4 (four) times daily. 11/28/23  Yes Melene Plan, DO  acetaminophen (TYLENOL) 500 MG tablet Take 2 tablets (1,000 mg total) by mouth every 6 (six) hours as needed. 11/07/23 11/06/24  Kathleen Lime, MD  acetaminophen-codeine (TYLENOL #3) 300-30 MG tablet Take 1 tablet by mouth every 6 (six) hours. 04/29/22   [provider]  Blood Glucose Monitoring Suppl (ACCU-CHEK GUIDE) w/Device KIT See admin instructions. 01/21/22   [provider]  empagliflozin (JARDIANCE) 25 MG TABS tablet Take 1 tablet (25 mg total) by mouth daily before breakfast. 07/30/23   Colbert Coyer, Priscila, MD  glucose blood test strip Use four times daily with insulin as directed. 10/28/22   Evlyn Kanner, MD  glucose monitoring kit (FREESTYLE) monitoring kit 1 each by Does not apply route as needed for other. 01/21/22   Ellison Carwin, MD  hydrochlorothiazide (HYDRODIURIL) 12.5 MG tablet Take 1 tablet (12.5 mg total) by mouth daily. 11/24/23   Philomena Doheny, MD  ibuprofen  (ADVIL) 200 MG tablet Take 1 tablet (200 mg total) by mouth every 6 (six) hours as needed. 11/07/23   Kathleen Lime, MD  Lancets (FREESTYLE) lancets Use as instructed 05/06/23   Hassan Rowan, [place], MD  latanoprost (XALATAN) 0.005 % ophthalmic solution Place 1 drop into both eyes at bedtime. 06/14/23   [provider]  lidocaine (LIDODERM) 5 % Place 1 patch onto the skin daily. Remove & Discard patch within 12 hours or as directed by MD Patient not taking: Reported on 10/31/2023 10/06/22   Ernie Avena, MD  losartan (COZAAR) 50 MG tablet Take 1 tablet (50 mg total) by mouth daily. 08/04/23   Philomena Doheny, MD  rosuvastatin (CRESTOR) 20 MG tablet Take 1 tablet (20 mg total) by mouth daily. 01/27/23 01/22/24  Rocky Morel, DO  traMADol (ULTRAM) 50 MG tablet Take 1 tablet (50 mg total) by mouth every 6 (six) hours as needed. 10/30/23 11/29/23  Manuela Neptune, MD      Allergies    Patient has no known allergies.    Review of Systems   Review of Systems  Physical Exam Updated Vital Signs BP (!) 137/90 (BP Location: Right Arm)   Pulse (!) 102   Temp (!) 97.4 F (36.3 C) (Oral)   Resp 16   Ht 5\' 5"  (1.651 m)   Wt 92.5 kg   LMP  (LMP Unknown)   SpO2 99%  BMI 33.95 kg/m  Physical Exam Vitals and nursing note reviewed.  Constitutional:      General: She is not in acute distress.    Appearance: She is well-developed. She is not diaphoretic.  HENT:     Head: Normocephalic and atraumatic.  Eyes:     Pupils: Pupils are equal, round, and reactive to light.  Cardiovascular:     Rate and Rhythm: Normal rate and regular rhythm.     Heart sounds: No murmur heard.    No friction rub. No gallop.  Pulmonary:     Effort: Pulmonary effort is normal.     Breath sounds: No wheezing or rales.  Abdominal:     General: There is no distension.     Palpations: Abdomen is soft.     Tenderness: There is no abdominal tenderness.  Musculoskeletal:        General:  No tenderness.     Cervical back: Normal range of motion and neck supple.     Comments: Pulse motor and sensation intact to the left lower extremity.  Reflexes are 2+ and equal.  There is no clonus.  Negative straight leg raise test.  No obvious midline spinal tenderness as well as her deformities.  Skin:    General: Skin is warm and dry.  Neurological:     Mental Status: She is alert and oriented to person, place, and time.  Psychiatric:        Behavior: Behavior normal.     ED Results / Procedures / Treatments   Labs (all labs ordered are listed, but only abnormal results are displayed) Labs Reviewed  COMPREHENSIVE METABOLIC PANEL - Abnormal; Notable for the following components:      Result Value   Glucose, Bld 113 (*)    All other components within normal limits  CBC - Abnormal; Notable for the following components:   RBC 5.67 (*)    Hemoglobin 16.2 (*)    HCT 49.2 (*)    All other components within normal limits  URINALYSIS, ROUTINE W REFLEX MICROSCOPIC - Abnormal; Notable for the following components:   APPearance HAZY (*)    Specific Gravity, Urine 1.037 (*)    Glucose, UA >=500 (*)    Ketones, ur 5 (*)    All other components within normal limits  LIPASE, BLOOD    EKG None  Radiology CT L-SPINE NO CHARGE Result Date: 11/28/2023 CLINICAL DATA:  Larey Seat 5 days ago with back and flank pain. Left leg pain. EXAM: CT LUMBAR SPINE WITHOUT CONTRAST TECHNIQUE: Multidetector CT imaging of the lumbar spine was performed without intravenous contrast administration. Multiplanar CT image reconstructions were also generated. RADIATION DOSE REDUCTION: This exam was performed according to the departmental dose-optimization program which includes automated exposure control, adjustment of the mA and/or kV according to patient size and/or use of iterative reconstruction technique. COMPARISON:  Radiography 06/11/2010.  CT abdomen 10/15/2023. FINDINGS: Segmentation: 5 lumbar type vertebral  bodies. Alignment: Normal Vertebrae: No regional fracture or focal bone lesion. Paraspinal and other soft tissues: Aortic atherosclerotic calcification. Disc levels: No significant disc level finding at T12-L1 or L1-2. L2-3: Mild bulging of the disc. Bilateral facet and ligamentous hypertrophy. Mild stenosis but no likely neural compression. L3-4: Bulging of the disc more prominent towards the left. Facet and ligamentous hypertrophy. Mild stenosis of the left lateral recess and foramen on the left but no definite neural compression. L4-5: Chronic disc degeneration with loss of disc height and vacuum phenomenon. Endplate osteophytes and bulging of the  disc. Facet degeneration and hypertrophy. Narrowing of the lateral recesses and foramina, right worse than left. Neural compression could occur at this level, more likely on the right. L5-S1: Mild bulging of the disc. Bilateral facet osteoarthritis. No compressive canal or foraminal narrowing. 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 IMPRESSION: 1. No acute or traumatic finding. 2. L4-5: Chronic disc degeneration with loss of disc height and vacuum phenomenon. Endplate osteophytes and bulging of the disc. Facet degeneration and hypertrophy. Narrowing of the lateral recesses and foramina, right worse than left. Neural compression could occur at this level, more likely on the right. 3. L3-4: Bulging of the disc more prominent towards the left. Facet and ligamentous hypertrophy. Mild stenosis of the left lateral recess and foramen on the left but no definite neural compression. 4. L5-S1: Mild bulging of the disc. Bilateral facet osteoarthritis. No compressive canal or foraminal narrowing. 5. 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 Electronically Signed   By: Paulina Fusi M.D.   On: 11/28/2023 15:24    Procedures Procedures    Medications Ordered in ED Medications  acetaminophen (TYLENOL) tablet 1,000 mg (1,000 mg Oral Given 11/28/23 1353)  ketorolac  (TORADOL) 15 MG/ML injection 15 mg (15 mg Intramuscular Given 11/28/23 1354)  oxyCODONE (Oxy IR/ROXICODONE) immediate release tablet 5 mg (5 mg Oral Given 11/28/23 1353)    ED Course/ Medical Decision Making/ A&P                                 Medical Decision Making Amount and/or Complexity of Data Reviewed Labs: ordered. Radiology: ordered.  Risk OTC drugs. Prescription drug management.   68 yo F with a chief complaint of left-sided low back pain and left leg pain.  By history and physical it sounds like the patient is having a radiculopathy.  No obvious rash.  She is describing some abdominal discomfort.  Will obtain CT imaging.  Treat symptoms.  Lab work.  Reassess.  No significant lab abnormality.  No acute anemia.    CT L spine with some nerve impingement on the left.  Consistent with her symptoms.  Awaiting CT stone study.   Signed out to Dr. Freida Busman, please see their note for further details of care in the ED.  The patients results and plan were reviewed and discussed.   Any x-rays performed were independently reviewed by myself.   Differential diagnosis were considered with the presenting HPI.  Medications  acetaminophen (TYLENOL) tablet 1,000 mg (1,000 mg Oral Given 11/28/23 1353)  ketorolac (TORADOL) 15 MG/ML injection 15 mg (15 mg Intramuscular Given 11/28/23 1354)  oxyCODONE (Oxy IR/ROXICODONE) immediate release tablet 5 mg (5 mg Oral Given 11/28/23 1353)    Vitals:   11/28/23 1304 11/28/23 1307  BP:  (!) 137/90  Pulse:  (!) 102  Resp:  16  Temp:  (!) 97.4 F (36.3 C)  TempSrc:  Oral  SpO2:  99%  Weight: 92.5 kg   Height: 5\' 5"  (1.651 m)     Final diagnoses:  Acute left-sided low back pain with left-sided sciatica           Final Clinical Impression(s) / ED Diagnoses Final diagnoses:  Acute left-sided low back pain with left-sided sciatica    Rx / DC Orders ED Discharge Orders          Ordered    diclofenac Sodium (VOLTAREN) 1 % GEL  4  times daily  11/28/23 1537              Melene Plan, DO 11/28/23 1544

## 2023-11-28 NOTE — ED Notes (Signed)
 Patient transported to CT

## 2023-12-01 ENCOUNTER — Telehealth: Payer: Self-pay

## 2023-12-01 ENCOUNTER — Ambulatory Visit (HOSPITAL_COMMUNITY): Admission: RE | Admit: 2023-12-01 | Payer: 59 | Source: Ambulatory Visit

## 2023-12-01 NOTE — Transitions of Care (Post Inpatient/ED Visit) (Unsigned)
   12/01/2023  Name: Colleen Williams MRN: [id number] DOB: [birth year]  Today's TOC FU Call Status: Today's TOC FU Call Status:: Unsuccessful Call (1st Attempt) Unsuccessful Call (1st Attempt) Date: 12/01/23  Attempted to reach the patient regarding the most recent Inpatient/ED visit.  Follow Up Plan: Additional outreach attempts will be made to reach the patient to complete the Transitions of Care (Post Inpatient/ED visit) call.   Signature Karena Addison, LPN [facility] Nurse Health Advisor Direct Dial [phone]

## 2023-12-04 NOTE — Transitions of Care (Post Inpatient/ED Visit) (Signed)
   12/04/2023  Name: Colleen Williams MRN: [id number] DOB: 13-Aug-1956  Today's TOC FU Call Status: Today's TOC FU Call Status:: Unsuccessful Call (3rd Attempt) Unsuccessful Call (1st Attempt) Date: 12/01/23 Unsuccessful Call (2nd Attempt) Date: 12/04/23 Unsuccessful Call (3rd Attempt) Date: 12/04/23  Attempted to reach the patient regarding the most recent Inpatient/ED visit.  Follow Up Plan: No further outreach attempts will be made at this time. We have been unable to contact the patient.  Signature Karena Addison, LPN [facility] Nurse Health Advisor Direct Dial [phone]

## 2023-12-04 NOTE — Transitions of Care (Post Inpatient/ED Visit) (Signed)
   12/04/2023  Name: Colleen Williams MRN: [id number] DOB: 24-Mar-1956  Today's TOC FU Call Status: Today's TOC FU Call Status:: Unsuccessful Call (2nd Attempt) Unsuccessful Call (1st Attempt) Date: 12/01/23 Unsuccessful Call (2nd Attempt) Date: 12/04/23  Attempted to reach the patient regarding the most recent Inpatient/ED visit.  Follow Up Plan: Additional outreach attempts will be made to reach the patient to complete the Transitions of Care (Post Inpatient/ED visit) call.   Signature Karena Addison, LPN [facility] Nurse Health Advisor Direct Dial [phone]

## 2023-12-19 DIAGNOSIS — H401231 Low-tension glaucoma, bilateral, mild stage: Secondary | ICD-10-CM | POA: Diagnosis not present

## 2024-01-12 ENCOUNTER — Ambulatory Visit: Payer: Self-pay

## 2024-01-12 ENCOUNTER — Ambulatory Visit (INDEPENDENT_AMBULATORY_CARE_PROVIDER_SITE_OTHER): Admitting: Student

## 2024-01-12 VITALS — BP 136/87 | HR 97 | Temp 97.7°F | Ht 65.0 in | Wt 207.7 lb

## 2024-01-12 DIAGNOSIS — E119 Type 2 diabetes mellitus without complications: Secondary | ICD-10-CM | POA: Diagnosis not present

## 2024-01-12 DIAGNOSIS — M79605 Pain in left leg: Secondary | ICD-10-CM

## 2024-01-12 MED ORDER — EMPAGLIFLOZIN 25 MG PO TABS
25.0000 mg | ORAL_TABLET | Freq: Every day | ORAL | 11 refills | Status: AC
Start: 2024-01-12 — End: ?

## 2024-01-12 MED ORDER — CYCLOBENZAPRINE HCL 10 MG PO TABS: 10.0000 mg | ORAL_TABLET | Freq: Two times a day (BID) | ORAL | 0 refills | Status: DC | PRN

## 2024-01-12 MED ORDER — NAPROXEN 500 MG PO TABS: 500.0000 mg | ORAL_TABLET | Freq: Two times a day (BID) | ORAL | 0 refills | Status: DC

## 2024-01-12 NOTE — Telephone Encounter (Signed)
 Copied from CRM [id number]. Topic: Clinical - Red Word Triage >> Jan 12, 2024  8:08 AM Colleen Williams B wrote: [facility] that prompted transfer to Nurse Triage: left leg pain in the upper thigh area coupled with some weakness    Chief Complaint: Left thigh pain and weakness Symptoms: Above Frequency: For awhile but getting worse Pertinent Negatives: Patient denies  Disposition: [] ED /[] Urgent Care (no appt availability in office) / [x] Appointment(In office/virtual)/ []  [place] [place] [facility]/ [] Home Care/ [] Refused Recommended Disposition /[] [facility]/ []  Follow-up with PCP Additional Notes: Agrees with appointment.  Reason for Disposition  [1] SEVERE pain (e.g., excruciating, unable to do any normal activities) AND [2] not improved after 2 hours of pain medicine  Answer Assessment - Initial Assessment Questions 1. ONSET: "When did the pain start?"      Sunday 2. LOCATION: "Where is the pain located?"      Left thigh, front 3. PAIN: "How bad is the pain?"    (Scale 1-10; or mild, moderate, severe)   -  MILD (1-3): doesn't interfere with normal activities    -  MODERATE (4-7): interferes with normal activities (e.g., work or school) or awakens from sleep, limping    -  SEVERE (8-10): excruciating pain, unable to do any normal activities, unable to walk     8 4. WORK OR EXERCISE: "Has there been any recent work or exercise that involved this part of the body?"      no 5. CAUSE: "What do you think is causing the leg pain?"     unsure 6. OTHER SYMPTOMS: "Do you have any other symptoms?" (e.g., chest pain, back pain, breathing difficulty, swelling, rash, fever, numbness, weakness)     weakness 7. PREGNANCY: "Is there any chance you are pregnant?" "When was your last menstrual period?"     No  Protocols used: Leg Pain-A-AH

## 2024-01-12 NOTE — Progress Notes (Signed)
 Acute Office Visit  Subjective:     Patient ID: Colleen Williams, female    DOB: 06-26-1956, 68 y.o.   MRN: [id number]  Chief Complaint  Patient presents with   Thigh pain    Patient is a 68 y.o. with a past medical history stated below who presents today for left leg pain that she believes is associated with a fall in March for which she was assessed at the [facility] ED. She is here today because her leg pain has worsened. She would also like to get a refill for her Jardiance . Please see problem based assessment and plan for additional details.     Review of Systems  Constitutional:  Negative for fever.  Cardiovascular:  Negative for chest pain and leg swelling.  Genitourinary:  Negative for dysuria.  Neurological:  Negative for focal weakness.       Left leg pain that travels from her back to the ankle.       Objective:    BP 136/87 (BP Location: Right Arm, Patient Position: Sitting, Cuff Size: Small)   Pulse 97   Temp 97.7 F (36.5 C) (Oral)   Ht 5\' 5"  (1.651 m)   Wt 207 lb 11.2 oz (94.2 kg)   LMP  (LMP Unknown)   SpO2 98%   BMI 34.56 kg/m  BP Readings from Last 3 Encounters:  01/12/24 136/87  11/28/23 (!) 149/96  11/07/23 134/89   Wt Readings from Last 3 Encounters:  01/12/24 207 lb 11.2 oz (94.2 kg)  11/28/23 204 lb (92.5 kg)  11/07/23 203 lb 9.6 oz (92.4 kg)   Physical Exam HENT:     Head: Normocephalic and atraumatic.  Cardiovascular:     Rate and Rhythm: Regular rhythm.     Pulses: Normal pulses.     Heart sounds: Normal heart sounds.  Pulmonary:     Effort: Pulmonary effort is normal.  Abdominal:     Palpations: Abdomen is soft.  Musculoskeletal:     Cervical back: Normal range of motion and neck supple. No rigidity or tenderness.     Comments: Strength in left leg 3/5 compared to right side when asked to lift against gravity/resistance. Some tenderness to deep palpation the left of midline in the lower back. Negative straight leg lift. Sensation  and pulses intact on left lower extremity. No swelling observed. Gait was observed to be slow and careful.   Skin:    General: Skin is warm.  Neurological:     General: No focal deficit present.     Mental Status: She is alert.  Psychiatric:        Mood and Affect: Mood normal.    No results found for any visits on 01/12/24.     Assessment & Plan:   Problem List Items Addressed This Visit     Type 2 diabetes mellitus (HCC)   Patient seen for diabetes follow up on 10/30/23 at [facility]. A1c at that time 6.8%, planned to continue with Jardiance  25 mg daily and Rosuvastatin  20 mg daily. Denies issues, requesting Jardiance  refill. Will continue with current plan.       Relevant Medications   empagliflozin  (JARDIANCE ) 25 MG TABS tablet   Left leg pain - Primary   Patient states she fell around the second week of March, landed on her left side, did not hit her head. States her left leg seemed to give out. She was evaluated at [facility] ED on 3/14 for left-sided back and leg pain.  Her exam at the time showed normal pulses, sensation, and reflexes in the left lower extremity, left leg raise test negative, no obvious deformity. CT imaging showed lumbar spine with some nerve impingement on the left consistent with symptoms. She was given Tylenol  1000 mg, Toradol  injection, and Oxy 5 mg PO x 1 with some improvement in symptoms. Patient states she was not given or advised on pain relief after discharge. Has been trying to do Tylenol  and Voltaren  gel but is worried that she is now having difficulty lifting up her left leg to step up on surfaces and her pain always seems to be at around 6-7/10.   Today she denies any fevers, nausea/vomiting, numbness, or weight loss concerning for infection, malignancy, or spinal cord compression. She did show decreased strength on left side compared to the right with LLE 3/5 strength. Also endorsed some tenderness to deep palpation the left of midline in the lower back.  Sensation and pulses intact on left lower extremity. No swelling observed. She was observed walking around the room carefully and in some discomfort. Did not appear to be in acute distress.   Reviewed imaging findings from her ED visit and pain management options. Through shared decision making, patient agreed to starting a muscle relaxant with anti-inflammatory therapy (Cr 0.66 and eGFR > 60 in March) for a short period of time, as well as physical therapy evaluation. She can continue to take Tylenol  and applying the Voltaren  gel. Patient given return precautions and will return to clinic as needed.  Plan - START taking cyclobenzaprine  (Flexeril ) 10 mg tablet ONE time at bedtime for ONE week. If no issues can take ONE 10 mg tablet during the day AND ONE time at bedtime for TWO more weeks.  - START taking Naproxen  (Naprosyn ) 500 mg twice a day with a meal for TWO weeks.  - CONTINUE taking Tylenol  500-650 mg every 6 to 8 hours as needed.  - CONTINUE applying Voltaren  gel to affected areas as needed.  - Physical therapy ordered today. - Reviewed return precautions.       Relevant Orders   Ambulatory referral to Physical Therapy   Meds ordered this encounter  Medications   empagliflozin  (JARDIANCE ) 25 MG TABS tablet    Sig: Take 1 tablet (25 mg total) by mouth daily before breakfast.    Dispense:  30 tablet    Refill:  11   cyclobenzaprine  (FLEXERIL ) 10 MG tablet    Sig: Take 1 tablet (10 mg total) by mouth 2 (two) times daily as needed for muscle spasms. *Start by taking 1 tablet by mouth once daily at bedtime. If no issues, can take 1 tablet up to twice a day as needed.    Dispense:  21 tablet    Refill:  0   naproxen  (NAPROSYN ) 500 MG tablet    Sig: Take 1 tablet (500 mg total) by mouth 2 (two) times daily with a meal.    Dispense:  28 tablet    Refill:  0   Patient discussed with Dr. Jarvis Mesa. Return 6-8 weeks, for if symptoms worsen or fail to improve.  Tawan Corkern Arellano Zameza,  MD PGY-1, [facility]

## 2024-01-12 NOTE — Patient Instructions (Signed)
 Thank you, [name] for allowing us  to provide your care today. Today we discussed your left leg pain.   I have ordered the following labs for you:  Lab Orders  No laboratory test(s) ordered today     Tests ordered today:  None  Referrals ordered today:    Referral Orders         Ambulatory referral to Physical Therapy      I have ordered the following medication/changed the following medications:   Stop the following medications: Medications Discontinued During This Encounter  Medication Reason   acetaminophen -codeine (TYLENOL  #3) 300-30 MG tablet    ibuprofen  (ADVIL ) 200 MG tablet    empagliflozin  (JARDIANCE ) 25 MG TABS tablet Reorder     Start the following medications: Meds ordered this encounter  Medications   empagliflozin  (JARDIANCE ) 25 MG TABS tablet    Sig: Take 1 tablet (25 mg total) by mouth daily before breakfast.    Dispense:  30 tablet    Refill:  11   cyclobenzaprine  (FLEXERIL ) 10 MG tablet    Sig: Take 1 tablet (10 mg total) by mouth 2 (two) times daily as needed for muscle spasms. *Start by taking 1 tablet by mouth once daily at bedtime. If no issues, can take 1 tablet up to twice a day as needed.    Dispense:  21 tablet    Refill:  0   naproxen  (NAPROSYN ) 500 MG tablet    Sig: Take 1 tablet (500 mg total) by mouth 2 (two) times daily with a meal.    Dispense:  28 tablet    Refill:  0     Follow up: 6-8 weeks (or sooner) as needed   Remember:   For pain:  START taking cyclobenzaprine  (Flexeril ) 10 mg tablet ONE time at bedtime for ONE week. If no issues with making you sleepy or unsteady you can take ONE 10 mg tablet during the day AND ONE time at bedtime for TWO more weeks. This should help with muscle tightness and pain in your left leg.   START taking Naproxen  (Naprosyn ) 500 mg twice a day with a meal for TWO weeks. Your kidney function is good based on your March lab results, but we do not want you to take this medication for an  extended period of time.   CONTINUE taking Tylenol  500-650 mg every 6 to 8 hours as needed.   CONTINUE applying Voltaren  gel to affected areas as needed.   You will get a call to help set up your physical therapy evaluation.   Please call us  if your pain worsens, you develop a fever, you develop any changes in bathroom habits such as having accidents, or you have another fall.   Should you have any questions or concerns please call the internal medicine clinic at [phone].     Colleen Shad Arellano Zameza, MD PGY-1 Internal Medicine Teaching [facility]

## 2024-01-12 NOTE — Telephone Encounter (Signed)
 Patient is scheduled to see pcp this morning.

## 2024-01-13 DIAGNOSIS — M79605 Pain in left leg: Secondary | ICD-10-CM | POA: Insufficient documentation

## 2024-01-13 NOTE — Assessment & Plan Note (Signed)
 Patient states she fell around the second week of March, landed on her left side, did not hit her head. States her left leg seemed to give out. She was evaluated at [facility] ED on 3/14 for left-sided back and leg pain. Her exam at the time showed normal pulses, sensation, and reflexes in the left lower extremity, left leg raise test negative, no obvious deformity. CT imaging showed lumbar spine with some nerve impingement on the left consistent with symptoms. She was given Tylenol  1000 mg, Toradol  injection, and Oxy 5 mg PO x 1 with some improvement in symptoms. Patient states she was not given or advised on pain relief after discharge. Has been trying to do Tylenol  and Voltaren  gel but is worried that she is now having difficulty lifting up her left leg to step up on surfaces and her pain always seems to be at around 6-7/10.   Today she denies any fevers, nausea/vomiting, numbness, or weight loss concerning for infection, malignancy, or spinal cord compression. She did show decreased strength on left side compared to the right with LLE 3/5 strength. Also endorsed some tenderness to deep palpation the left of midline in the lower back. Sensation and pulses intact on left lower extremity. No swelling observed. She was observed walking around the room carefully and in some discomfort. Did not appear to be in acute distress.   Reviewed imaging findings from her ED visit and pain management options. Through shared decision making, patient agreed to starting a muscle relaxant with anti-inflammatory therapy (Cr 0.66 and eGFR > 60 in March) for a short period of time, as well as physical therapy evaluation. She can continue to take Tylenol  and applying the Voltaren  gel. Patient given return precautions and will return to clinic as needed.  Plan - START taking cyclobenzaprine  (Flexeril ) 10 mg tablet ONE time at bedtime for ONE week. If no issues can take ONE 10 mg tablet during the day AND ONE time at bedtime for  TWO more weeks.  - START taking Naproxen  (Naprosyn ) 500 mg twice a day with a meal for TWO weeks.  - CONTINUE taking Tylenol  500-650 mg every 6 to 8 hours as needed.  - CONTINUE applying Voltaren  gel to affected areas as needed.  - Physical therapy ordered today. - Reviewed return precautions.

## 2024-01-13 NOTE — Assessment & Plan Note (Signed)
 Patient seen for diabetes follow up on 10/30/23 at [facility]. A1c at that time 6.8%, planned to continue with Jardiance  25 mg daily and Rosuvastatin  20 mg daily. Denies issues, requesting Jardiance  refill. Will continue with current plan.

## 2024-01-13 NOTE — Assessment & Plan Note (Signed)
>>  ASSESSMENT AND PLAN FOR LEFT LEG PAIN WRITTEN ON 01/13/2024  9:22 AM BY ARELLANO ZAMEZA, PRISCILA, MD  Patient states she fell around the second week of March, landed on her left side, did not hit her head. States her left leg seemed to give out. She was evaluated at [facility] ED on 3/14 for left-sided back and leg pain. Her exam at the time showed normal pulses, sensation, and reflexes in the left lower extremity, left leg raise test negative, no obvious deformity. CT imaging showed lumbar spine with some nerve impingement on the left consistent with symptoms. She was given Tylenol  1000 mg, Toradol  injection, and Oxy 5 mg PO x 1 with some improvement in symptoms. Patient states she was not given or advised on pain relief after discharge. Has been trying to do Tylenol  and Voltaren  gel but is worried that she is now having difficulty lifting up her left leg to step up on surfaces and her pain always seems to be at around 6-7/10.   Today she denies any fevers, nausea/vomiting, numbness, or weight loss concerning for infection, malignancy, or spinal cord compression. She did show decreased strength on left side compared to the right with LLE 3/5 strength. Also endorsed some tenderness to deep palpation the left of midline in the lower back. Sensation and pulses intact on left lower extremity. No swelling observed. She was observed walking around the room carefully and in some discomfort. Did not appear to be in acute distress.   Reviewed imaging findings from her ED visit and pain management options. Through shared decision making, patient agreed to starting a muscle relaxant with anti-inflammatory therapy (Cr 0.66 and eGFR > 60 in March) for a short period of time, as well as physical therapy evaluation. She can continue to take Tylenol  and applying the Voltaren  gel. Patient given return precautions and will return to clinic as needed.  Plan - START taking cyclobenzaprine  (Flexeril ) 10 mg tablet ONE time at  bedtime for ONE week. If no issues can take ONE 10 mg tablet during the day AND ONE time at bedtime for TWO more weeks.  - START taking Naproxen  (Naprosyn ) 500 mg twice a day with a meal for TWO weeks.  - CONTINUE taking Tylenol  500-650 mg every 6 to 8 hours as needed.  - CONTINUE applying Voltaren  gel to affected areas as needed.  - Physical therapy ordered today. - Reviewed return precautions.

## 2024-01-14 NOTE — Addendum Note (Signed)
 Addended by: Byan Poplaski L on: 01/14/2024 11:37 AM   Modules accepted: Level of Service

## 2024-01-14 NOTE — Progress Notes (Signed)
 Internal Medicine Clinic Attending  Case discussed with the resident at the time of the visit.  We reviewed the resident's history and exam and pertinent patient test results.  I agree with the assessment, diagnosis, and plan of care documented in the resident's note.

## 2024-01-16 ENCOUNTER — Ambulatory Visit: Attending: Internal Medicine

## 2024-01-16 ENCOUNTER — Other Ambulatory Visit: Payer: Self-pay

## 2024-01-16 DIAGNOSIS — M79662 Pain in left lower leg: Secondary | ICD-10-CM | POA: Diagnosis not present

## 2024-01-16 DIAGNOSIS — R262 Difficulty in walking, not elsewhere classified: Secondary | ICD-10-CM | POA: Insufficient documentation

## 2024-01-16 DIAGNOSIS — M6281 Muscle weakness (generalized): Secondary | ICD-10-CM | POA: Diagnosis not present

## 2024-01-16 DIAGNOSIS — M79605 Pain in left leg: Secondary | ICD-10-CM | POA: Insufficient documentation

## 2024-01-16 NOTE — Therapy (Signed)
 OUTPATIENT PHYSICAL THERAPY LOWER EXTREMITY EVALUATION   Patient Name: Colleen Williams MRN: [id number] DOB:1956-06-24, 67 y.o., female Today's Date: 01/16/2024  END OF SESSION:  PT End of Session - 01/16/24 0937     Visit Number 1    Number of Visits 13    Date for PT Re-Evaluation 03/05/24    Authorization Type [employer] D[employer]PLETE    Authorization - Visit Number 1    Authorization - Number of Visits 27    PT Start Time 0931    PT Stop Time 1015    PT Time Calculation (min) 44 min    Activity Tolerance Patient tolerated treatment well    Behavior During Therapy [facility] for tasks assessed/performed             Past Medical History:  Diagnosis Date   Arthritis    Diabetes mellitus without complication (HCC)    Hyperlipidemia    Hypertension    Vitreous floaters of both eyes 08/03/2017   Past Surgical History:  Procedure Laterality Date   COLONOSCOPY     TUBAL LIGATION     Patient Active Problem List   Diagnosis Date Noted   Left leg pain 01/13/2024   Right sided abdominal pain 11/07/2023   Thickened endometrium 10/31/2023   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   Adjustment disorder 10/30/2023   Morbid obesity (HCC) 04/24/2022   Back pain 03/15/2020   Hyperlipidemia associated with type 2 diabetes mellitus (HCC) 11/12/2019   Essential hypertension 08/03/2017   Type 2 diabetes mellitus (HCC) 01/30/2017   Osteoarthritis of ankle and foot 07/30/2016   Healthcare maintenance 06/12/2015    PCP: Arellano Zameza, Priscila, MD   REFERRING PROVIDER: Sherol Dixie, MD   REFERRING DIAG: [phone] (ICD-10-CM) - Left leg pain   THERAPY DIAG:  Pain in left lower leg  Muscle weakness (generalized)  Difficulty in walking, not elsewhere classified  Rationale for Evaluation and Treatment: Rehabilitation  ONSET DATE: Over a month  SUBJECTIVE:   SUBJECTIVE STATEMENT: Pt reports her L leg gave out on her and she fell backwards at  church. She notes she was not having an issue with the L leg prior to the fall. After fall she started experiencing L thigh and lower leg pain, and currently is having difficulty lifting her L LE.Denies NT.  PERTINENT HISTORY: DM2, high BMI PAIN:  Are you having pain? Yes: NPRS scale: 0/10. Took muscle relaxer last night Pain location: L thigh and lower leg Pain description: ache, sharp Aggravating factors: Trying to eep, sometimes during the day  Relieving factors: Muscle relaxer Ave pain is a 6/10  PRECAUTIONS: None  RED FLAGS: None   WEIGHT BEARING RESTRICTIONS: No  FALLS:  Has patient fallen in last 6 months? Yes. Number of falls 1 Reason for referral   LIVING ENVIRONMENT: Lives with: lives alone Lives in: House/apartment Stairs: No Has following equipment at home: None  OCCUPATION: Not working  PLOF: Independent  PATIENT GOALS: Getting rid of the pain  NEXT MD VISIT: Not scheduled  OBJECTIVE:  Note: Objective measures were completed at Evaluation unless otherwise noted.  DIAGNOSTIC FINDINGS:  11/28/23 MRI Lumbar spine IMPRESSION: 1. No acute or traumatic finding. 2. L4-5: Chronic disc degeneration with loss of disc height and vacuum phenomenon. Endplate osteophytes and bulging of the disc. Facet degeneration and hypertrophy. Narrowing of the lateral recesses and foramina, right worse than left. Neural compression could occur at this level, more likely on the right. 3. L3-4: Bulging  of the disc more prominent towards the left. Facet and ligamentous hypertrophy. Mild stenosis of the left lateral recess and foramen on the left but no definite neural compression. 4. L5-S1: Mild bulging of the disc. Bilateral facet osteoarthritis. No compressive canal or foraminal narrowing. 5. 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  PATIENT SURVEYS:  LEFS 13/80=16%  COGNITION: Overall cognitive status: Within functional limits for tasks  assessed     SENSATION: WFL  EDEMA:  None observed  MUSCLE LENGTH: Hamstrings: Right WNLs deg; Left WNLs deg Andy Bannister test: Right NT deg; Left NT deg  POSTURE: rounded shoulders and forward head  PALPATION: TTP  LOWER EXTREMITY ROM: WFLs Active ROM Right eval Left eval  Hip flexion    Hip extension    Hip abduction    Hip adduction    Hip internal rotation    Hip external rotation    Knee flexion    Knee extension    Ankle dorsiflexion    Ankle plantarflexion    Ankle inversion    Ankle eversion     (Blank rows = not tested)  LOWER EXTREMITY MMT:  MMT Right eval Left eval  Hip flexion 4+ 2  Hip extension    Hip abduction 4+ 3-  Hip adduction    Hip internal rotation    Hip external rotation 4+ 4+  Knee flexion 5 4  Knee extension 5 4  Ankle dorsiflexion    Ankle plantarflexion    Ankle inversion    Ankle eversion     (Blank rows = not tested)  LOWER EXTREMITY SPECIAL TESTS:  SLR; nedgative  FUNCTIONAL TESTS:  5 times sit to stand: TBA 2 minute walk test: TBA  GAIT: Distance walked: 200' Assistive device utilized: None Level of assistance: Complete Independence Comments: WNLs                                                                                                                TREATMENT DATE:  [facility] Adult PT Treatment:                                                DATE: 01/16/24 Therapeutic Exercise: Developed, instructed in, and pt completed therex as noted in HEP   PATIENT EDUCATION:  Education details: Eval findings, POC, HEP Person educated: Patient Education method: Explanation, Demonstration, Tactile cues, and Verbal cues Education comprehension: verbalized understanding, returned demonstration, verbal cues required, and tactile cues required  HOME EXERCISE PROGRAM: Access Code: [email or web address] URL: [email or web address] Date: 01/16/2024 Prepared by: Liborio Reeds  Exercises - Standing March with Counter Support  - 1  x daily - 7 x weekly - 2-3 sets - 10 reps - Standing Hip Flexion with Counter Support  - 1 x daily - 7 x weekly - 2-3 sets - 10 reps - Standing Hip Abduction with Counter Support  - 1 x daily - 7 x weekly -  2-3 sets - 10 reps  ASSESSMENT:  CLINICAL IMPRESSION: Patient is a 68 y.o. female who was seen today for physical therapy evaluation and treatment for M79.605 (ICD-10-CM) - Left leg pain. REFERRAL NOTE: Patient had a ground level fall in March, CT scan with osteoarthritis and pinched nerve on left leading to left leg pain and sciatica. Patient is now having difficulties lifting up her left leg, especially to step up. In need of evaluation for safety and pain management.  Pt presents to pt with trunk movements WNLs and did not reproduce pain. SLR test, L and R is negative. L hip flexion and abduction were found to be weak. Gait pattern was WNLs. A HEP to address L LE weakness was initiated. Pt will benefit from skilled PT to address impairments to optimize L LE function with less pain.   OBJECTIVE IMPAIRMENTS: decreased activity tolerance, difficulty walking, decreased strength, obesity, and pain.   ACTIVITY LIMITATIONS: carrying, lifting, bending, standing, squatting, and locomotion level  PARTICIPATION LIMITATIONS: meal prep, cleaning, laundry, community activity, and church  PERSONAL FACTORS: Past/current experiences, Time since onset of injury/illness/exacerbation, and 1-2 comorbidities: DM2, high BMI  are also affecting patient's functional outcome.   REHAB POTENTIAL: Good  CLINICAL DECISION MAKING: Evolving/moderate complexity  EVALUATION COMPLEXITY: Moderate   GOALS:   SHORT TERM GOALS = LTGs  LONG TERM GOALS: Target date: 03/05/24  Pt will be Ind in a final HEP to maintain achieved LOF  Baseline: started Goal status: INITIAL  2.  Increase hip flexion and abduction strength to 4/5 fro improved function with ADLs Baseline:  Goal status: INITIAL  3.  Improve 5xSTS by  MCID of 5" and by MCID of 60ft as indication of improved functional mobility  Baseline: TBA Goal status: INITIAL  4.  Pt's LEFS score will improved to 40% or greater as indication of improved function  Baseline: 16% Goal status: INITIAL   PLAN:  PT FREQUENCY: 2x/week  PT DURATION: 6 weeks  PLANNED INTERVENTIONS: 97164- PT Re-evaluation, 97110-Therapeutic exercises, 97530- Therapeutic activity, 97112- Neuromuscular re-education, 97535- Self Care, 72536- Manual therapy, [phone]- Gait training, [phone]- Aquatic Therapy, [phone]- Electrical stimulation (unattended), Patient/Family education, Balance training, Stair training, Taping, Dry Needling, Joint mobilization, Spinal mobilization, Cryotherapy, and Moist heat  PLAN FOR NEXT SESSION: Assess 5xSTS and ; assess response to HEP; progress therex as indicated; use of modalities, manual therapy; and TPDN as indicated.    Ellis Koffler MS, PT 01/17/24 11:41 PM

## 2024-01-17 NOTE — Therapy (Incomplete)
 OUTPATIENT PHYSICAL THERAPY LOWER EXTREMITY EVALUATION   Patient Name: Colleen Williams MRN: [id number] DOB:March 26, 1956, 68 y.o., female Today's Date: 01/16/2024  END OF SESSION:  PT End of Session - 01/16/24 0937     Visit Number 1    Number of Visits 13    Date for PT Re-Evaluation 03/05/24    Authorization Type [employer] D[employer] COMPLETE    Authorization - Visit Number 1    Authorization - Number of Visits 27    PT Start Time [phone]    PT Stop Time 1015    PT Time Calculation (min) 44 min    Activity Tolerance Patient tolerated treatment well    Behavior During Therapy WFL for tasks assessed/performed             Past Medical History:  Diagnosis Date  . Arthritis   . Diabetes mellitus without complication (HCC)   . Hyperlipidemia   . Hypertension   . Vitreous floaters of both eyes 08/03/2017   Past Surgical History:  Procedure Laterality Date  . COLONOSCOPY    . TUBAL LIGATION     Patient Active Problem List   Diagnosis Date Noted  . Left leg pain 01/13/2024  . Right sided abdominal pain 11/07/2023  . Thickened endometrium 10/31/2023  . 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 Adjustment disorder 10/30/2023  . Morbid obesity (HCC) 04/24/2022  . Back pain 03/15/2020  . Hyperlipidemia associated with type 2 diabetes mellitus (HCC) 11/12/2019  . Essential hypertension 08/03/2017  . Type 2 diabetes mellitus (HCC) 01/30/2017  . Osteoarthritis of ankle and foot 07/30/2016  . Healthcare maintenance 06/12/2015    PCP: Arellano Zameza, Priscila, MD   REFERRING PROVIDER: Sherol Dixie, MD   REFERRING DIAG: [phone] (ICD-10-CM) - Left leg pain   THERAPY DIAG:  Pain in left lower leg  Muscle weakness (generalized)  Difficulty in walking, not elsewhere classified  Rationale for Evaluation and Treatment: Rehabilitation  ONSET DATE: Over a month  SUBJECTIVE:   SUBJECTIVE STATEMENT: Pt reports her L leg gave out on her and  she fell backwards at church. She notes she was not having an issue with the L leg prior to the fall. After fall she started experiencing L thigh and lower leg pain, and currently is having difficulty lifting her L LE.Denies NT.  PERTINENT HISTORY: DM2, high BMI PAIN:  Are you having pain? Yes: NPRS scale: 0/10. Took muscle relaxer last night Pain location: L thigh and lower leg Pain description: ache, sharp Aggravating factors: Trying to eep, sometimes during the day  Relieving factors: Muscle relaxer Ave pain is a 6/10  PRECAUTIONS: None  RED FLAGS: None   WEIGHT BEARING RESTRICTIONS: No  FALLS:  Has patient fallen in last 6 months? Yes. Number of falls 1 Reason for referral   LIVING ENVIRONMENT: Lives with: lives alone Lives in: House/apartment Stairs: No Has following equipment at home: None  OCCUPATION: Not working  PLOF: Independent  PATIENT GOALS: Getting rid of the pain  NEXT MD VISIT: Not scheduled  OBJECTIVE:  Note: Objective measures were completed at Evaluation unless otherwise noted.  DIAGNOSTIC FINDINGS:  11/28/23 MRI Lumbar spine IMPRESSION: 1. No acute or traumatic finding. 2. L4-5: Chronic disc degeneration with loss of disc height and vacuum phenomenon. Endplate osteophytes and bulging of the disc. Facet degeneration and hypertrophy. Narrowing of the lateral recesses and foramina, right worse than left. Neural compression could occur at this level, more likely on the right. 3. L3-4: Bulging  of the disc more prominent towards the left. Facet and ligamentous hypertrophy. Mild stenosis of the left lateral recess and foramen on the left but no definite neural compression. 4. L5-S1: Mild bulging of the disc. Bilateral facet osteoarthritis. No compressive canal or foraminal narrowing. 5. 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  PATIENT SURVEYS:  LEFS 13/80=16%  COGNITION: Overall cognitive status: Within functional limits for tasks  assessed     SENSATION: WFL  EDEMA:  None observed  MUSCLE LENGTH: Hamstrings: Right WNLs deg; Left WNLs deg Andy Bannister test: Right NT deg; Left NT deg  POSTURE: rounded shoulders and forward head  PALPATION: TTP  LOWER EXTREMITY ROM: WFLs Active ROM Right eval Left eval  Hip flexion    Hip extension    Hip abduction    Hip adduction    Hip internal rotation    Hip external rotation    Knee flexion    Knee extension    Ankle dorsiflexion    Ankle plantarflexion    Ankle inversion    Ankle eversion     (Blank rows = not tested)  LOWER EXTREMITY MMT:  MMT Right eval Left eval  Hip flexion 4+ 2  Hip extension    Hip abduction 4+ 3-  Hip adduction    Hip internal rotation    Hip external rotation 4+ 4+  Knee flexion 5 4  Knee extension 5 4  Ankle dorsiflexion    Ankle plantarflexion    Ankle inversion    Ankle eversion     (Blank rows = not tested)  LOWER EXTREMITY SPECIAL TESTS:  SLR; nedgative  FUNCTIONAL TESTS:  5 times sit to stand: TBA 2 minute walk test: TBA  GAIT: Distance walked: 200' Assistive device utilized: None Level of assistance: Complete Independence Comments: WNLs                                                                                                                TREATMENT DATE:  [facility] Adult PT Treatment:                                                DATE: 01/16/24 Therapeutic Exercise: Developed, instructed in, and pt completed therex as noted in HEP   PATIENT EDUCATION:  Education details: Eval findings, POC, HEP Person educated: Patient Education method: Explanation, Demonstration, Tactile cues, and Verbal cues Education comprehension: verbalized understanding, returned demonstration, verbal cues required, and tactile cues required  HOME EXERCISE PROGRAM: Access Code: [email or web address] URL: [email or web address] Date: 01/16/2024 Prepared by: Liborio Reeds  Exercises - Standing March with Counter Support  - 1  x daily - 7 x weekly - 2-3 sets - 10 reps - Standing Hip Flexion with Counter Support  - 1 x daily - 7 x weekly - 2-3 sets - 10 reps - Standing Hip Abduction with Counter Support  - 1 x daily - 7 x weekly -  2-3 sets - 10 reps  ASSESSMENT:  CLINICAL IMPRESSION: Patient is a 68 y.o. female who was seen today for physical therapy evaluation and treatment for M79.605 (ICD-10-CM) - Left leg pain. REFERRAL NOTE: Patient had a ground level fall in March, CT scan with osteoarthritis and pinched nerve on left leading to left leg pain and sciatica. Patient is now having difficulties lifting up her left leg, especially to step up. In need of evaluation for safety and pain management.  Pt presents to pt with trunk movements WNLs and did not reproduce pain. SLR test, L and R is negative. L hip flexion and abduction were found to be weak. Gait pattern was WNLs. A HEP to address L LE weakness was initiated. Pt will benefit from skilled PT to address impairments to optimize L LE function with less pain.   OBJECTIVE IMPAIRMENTS: decreased activity tolerance, difficulty walking, decreased strength, obesity, and pain.   ACTIVITY LIMITATIONS: carrying, lifting, bending, standing, squatting, and locomotion level  PARTICIPATION LIMITATIONS: meal prep, cleaning, laundry, community activity, and church  PERSONAL FACTORS: Past/current experiences, Time since onset of injury/illness/exacerbation, and 1-2 comorbidities: DM2, high BMI  are also affecting patient's functional outcome.   REHAB POTENTIAL: Good  CLINICAL DECISION MAKING: Evolving/moderate complexity  EVALUATION COMPLEXITY: Moderate   GOALS:   SHORT TERM GOALS = LTGs  LONG TERM GOALS: Target date: 03/05/24  Pt will be Ind in a final HEP to maintain achieved LOF  Baseline: started Goal status: INITIAL  2.  Increase hip flexion and abduction strength to 4/5 fro improved function with ADLs Baseline:  Goal status: INITIAL  3.  Improve 5xSTS by  MCID of 5" and by MCID of 99ft as indication of improved functional mobility  Baseline: TBA Goal status: INITIAL  4.  Pt's FOTO score will improved to the predicted value of % as indication of improved function  Baseline:  Goal status: INITIAL  5.  *** Baseline:  Goal status: INITIAL  6.  *** Baseline:  Goal status: INITIAL   PLAN:  PT FREQUENCY: {rehab frequency:25116}  PT DURATION: {rehab duration:25117}  PLANNED INTERVENTIONS: {rehab planned interventions:25118::"97110-Therapeutic exercises","97530- Therapeutic [phone]- Neuromuscular re-education","97535- Self JXBJ","47829- Manual therapy"}  PLAN FOR NEXT SESSION: ***   Emillee Talsma MS, PT 01/17/24 11:41 PM

## 2024-01-19 ENCOUNTER — Encounter: Admitting: Physical Therapy

## 2024-01-19 ENCOUNTER — Telehealth: Payer: Self-pay | Admitting: Physical Therapy

## 2024-01-19 NOTE — Telephone Encounter (Signed)
 Patient did not show for her 1st PT treatment today at 11:00.  I asked that she call to get rescheduled if she can and I remindedher of her next appt.  I also reminded her of our attendance policy.  This is her 1st no show.   Marci Setter, PT 01/19/24 11:30 AM Phone: [phone] Fax: [phone]

## 2024-01-19 NOTE — Therapy (Deleted)
 OUTPATIENT PHYSICAL THERAPY LOWER EXTREMITY EVALUATION   Patient Name: Colleen Williams MRN: [id number] DOB:12-06-1955, 68 y.o., female Today's Date: 01/19/2024  END OF SESSION:    Past Medical History:  Diagnosis Date   Arthritis    Diabetes mellitus without complication (HCC)    Hyperlipidemia    Hypertension    Vitreous floaters of both eyes 08/03/2017   Past Surgical History:  Procedure Laterality Date   COLONOSCOPY     TUBAL LIGATION     Patient Active Problem List   Diagnosis Date Noted   Left leg pain 01/13/2024   Right sided abdominal pain 11/07/2023   Thickened endometrium 10/31/2023   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  Rationale for Evaluation and Treatment: Rehabilitation  ONSET DATE: Over a month  SUBJECTIVE:   SUBJECTIVE STATEMENT: Pt reports her L leg gave out on her and she fell backwards at church. She notes she was not having an issue with the L leg prior to the fall. After fall she started experiencing L thigh and lower leg pain, and currently is having difficulty lifting her L LE.Denies NT.  PERTINENT HISTORY: DM2, high BMI PAIN:  Are you having pain? Yes: NPRS scale: 0/10. Took muscle relaxer last night Pain location: L thigh and lower leg Pain description: ache, sharp Aggravating factors: Trying to eep, sometimes during the day  Relieving factors: Muscle relaxer Ave pain is a 6/10  PRECAUTIONS: None  RED  FLAGS: None   WEIGHT BEARING RESTRICTIONS: No  FALLS:  Has patient fallen in last 6 months? Yes. Number of falls 1 Reason for referral   LIVING ENVIRONMENT: Lives with: lives alone Lives in: House/apartment Stairs: No Has following equipment at home: None  OCCUPATION: Not working  PLOF: Independent  PATIENT GOALS: Getting rid of the pain  NEXT MD VISIT: Not scheduled  OBJECTIVE:  Note: Objective measures were completed at Evaluation unless otherwise noted.  DIAGNOSTIC FINDINGS:  11/28/23 MRI Lumbar spine IMPRESSION: 1. No acute or traumatic finding. 2. L4-5: Chronic disc degeneration with loss of disc height and vacuum phenomenon. Endplate osteophytes and bulging of the disc. Facet degeneration and hypertrophy. Narrowing of the lateral recesses and foramina, right worse than left. Neural compression could occur at this level, more likely on the right. 3. L3-4: Bulging of the disc more prominent towards the left. Facet and ligamentous hypertrophy. Mild stenosis of the left lateral recess and foramen on the left but no definite neural compression. 4. L5-S1: Mild bulging of the disc. Bilateral facet osteoarthritis. No compressive canal or foraminal narrowing. 5. 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  PATIENT SURVEYS:  LEFS 13/80=16%  COGNITION: Overall cognitive status: Within functional limits for tasks assessed     SENSATION: WFL  EDEMA:  None observed  MUSCLE LENGTH: Hamstrings: Right WNLs deg; Left WNLs deg Andy Bannister test: Right NT deg; Left NT deg  POSTURE: rounded shoulders and forward head  PALPATION: TTP  LOWER EXTREMITY ROM: WFLs Active ROM Right eval Left eval  Hip flexion    Hip extension    Hip abduction    Hip adduction    Hip internal rotation    Hip external rotation    Knee flexion    Knee extension    Ankle dorsiflexion    Ankle plantarflexion    Ankle inversion    Ankle eversion     (Blank rows = not tested)  LOWER  EXTREMITY MMT:  MMT Right eval Left eval  Hip flexion 4+ 2  Hip extension    Hip abduction 4+ 3-  Hip adduction    Hip internal rotation    Hip external rotation 4+ 4+  Knee flexion 5 4  Knee extension 5 4  Ankle dorsiflexion    Ankle plantarflexion    Ankle inversion    Ankle eversion     (Blank rows = not tested)  LOWER EXTREMITY SPECIAL TESTS:  SLR; nedgative  FUNCTIONAL TESTS:  5 times sit to stand: TBA 2 minute walk test: TBA  GAIT: Distance walked: 200' Assistive device utilized: None Level of assistance: Complete Independence Comments: WNLs                                                                                                                TREATMENT DATE:  [facility] Adult PT Treatment:                                                DATE: 01/16/24 Therapeutic Exercise: Developed, instructed in, and pt completed therex as noted in HEP   PATIENT EDUCATION:  Education details: Eval findings, POC, HEP Person educated: Patient Education method: Explanation, Demonstration, Tactile cues, and Verbal cues Education comprehension: verbalized understanding, returned demonstration, verbal cues required, and tactile cues required  HOME EXERCISE PROGRAM: Access Code: [email or web address] URL: [email or web address] Date: 01/16/2024 Prepared by: Liborio Reeds  Exercises - Standing March with Counter Support  - 1 x daily - 7 x weekly - 2-3 sets - 10 reps - Standing Hip Flexion with Counter Support  - 1 x daily - 7 x weekly - 2-3 sets - 10 reps - Standing Hip Abduction with Counter Support  - 1 x daily - 7 x weekly - 2-3 sets - 10 reps  ASSESSMENT:  CLINICAL IMPRESSION: Patient is a 68 y.o. female who was seen today for physical therapy evaluation and treatment for M79.605 (ICD-10-CM) - Left leg pain. REFERRAL NOTE: Patient had a ground level fall in March, CT scan with osteoarthritis and pinched nerve on left leading to left leg pain and sciatica. Patient is now  having difficulties lifting up her left leg, especially to step up. In need of evaluation for safety and pain management.  Pt presents to pt with trunk movements WNLs and did not reproduce pain. SLR test, L and R is negative. L hip flexion and abduction were found to be weak. Gait pattern was WNLs. A HEP  to address L LE weakness was initiated. Pt will benefit from skilled PT to address impairments to optimize L LE function with less pain.   OBJECTIVE IMPAIRMENTS: decreased activity tolerance, difficulty walking, decreased strength, obesity, and pain.   ACTIVITY LIMITATIONS: carrying, lifting, bending, standing, squatting, and locomotion level  PARTICIPATION LIMITATIONS: meal prep, cleaning, laundry, community activity, and church  PERSONAL FACTORS: Past/current experiences, Time since onset of injury/illness/exacerbation, and 1-2 comorbidities: DM2, high BMI  are also affecting patient's functional outcome.   REHAB POTENTIAL: Good  CLINICAL DECISION MAKING: Evolving/moderate complexity  EVALUATION COMPLEXITY: Moderate   GOALS:   SHORT TERM GOALS = LTGs  LONG TERM GOALS: Target date: 03/05/24  Pt will be Ind in a final HEP to maintain achieved LOF  Baseline: started Goal status: INITIAL  2.  Increase hip flexion and abduction strength to 4/5 fro improved function with ADLs Baseline:  Goal status: INITIAL  3.  Improve 5xSTS by MCID of 5" and by MCID of 32ft as indication of improved functional mobility  Baseline: TBA Goal status: INITIAL  4.  Pt's LEFS score will improved to 40% or greater as indication of improved function  Baseline: 16% Goal status: INITIAL   PLAN:  PT FREQUENCY: 2x/week  PT DURATION: 6 weeks  PLANNED INTERVENTIONS: 97164- PT Re-evaluation, 97110-Therapeutic exercises, 97530- Therapeutic activity, 97112- Neuromuscular re-education, 97535- Self Care, 16109- Manual therapy, [phone]- Gait training, [phone]- Aquatic Therapy, [phone]- Electrical stimulation  (unattended), Patient/Family education, Balance training, Stair training, Taping, Dry Needling, Joint mobilization, Spinal mobilization, Cryotherapy, and Moist heat  PLAN FOR NEXT SESSION: Assess 5xSTS and ; assess response to HEP; progress therex as indicated; use of modalities, manual therapy; and TPDN as indicated.    Allen Ralls MS, PT 01/19/24 8:04 AM

## 2024-01-23 ENCOUNTER — Ambulatory Visit

## 2024-01-26 ENCOUNTER — Other Ambulatory Visit: Payer: Self-pay | Admitting: Student

## 2024-01-26 DIAGNOSIS — E1169 Type 2 diabetes mellitus with other specified complication: Secondary | ICD-10-CM

## 2024-01-26 NOTE — Telephone Encounter (Signed)
 Medication sent to pharmacy

## 2024-01-28 ENCOUNTER — Encounter: Payer: Self-pay | Admitting: Physical Therapy

## 2024-01-28 ENCOUNTER — Ambulatory Visit: Admitting: Physical Therapy

## 2024-01-28 DIAGNOSIS — M79662 Pain in left lower leg: Secondary | ICD-10-CM

## 2024-01-28 DIAGNOSIS — M6281 Muscle weakness (generalized): Secondary | ICD-10-CM

## 2024-01-28 DIAGNOSIS — M79605 Pain in left leg: Secondary | ICD-10-CM | POA: Diagnosis not present

## 2024-01-28 DIAGNOSIS — R262 Difficulty in walking, not elsewhere classified: Secondary | ICD-10-CM

## 2024-01-28 NOTE — Therapy (Signed)
 OUTPATIENT PHYSICAL THERAPY LOWER EXTREMITY NOTE   Patient Name: Colleen Williams MRN: [id number] DOB:[birth year], 68 y.o., female Today's Date: 01/28/2024  END OF SESSION:  PT End of Session - 01/28/24 1019     Visit Number 2    Number of Visits 13    Date for PT Re-Evaluation 03/05/24    Authorization Type [employer]    Authorization - Visit Number 2    Authorization - Number of Visits 27    PT Start Time 1018    PT Stop Time 1100    PT Time Calculation (min) 42 min    Behavior During Therapy [facility] 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   Adjustment disorder 10/30/2023   Morbid obesity (HCC) 04/24/2022   Back pain 03/15/2020   Hyperlipidemia associated with type 2 diabetes mellitus (HCC) 11/12/2019   Essential hypertension 08/03/2017   Type 2 diabetes mellitus (HCC) 01/30/2017   Osteoarthritis of ankle and foot 07/30/2016   Healthcare maintenance 06/12/2015    PCP: Arellano Zameza, Priscila, MD   REFERRING PROVIDER: Sherol Dixie, MD   REFERRING DIAG: [phone] (ICD-10-CM) - Left leg pain   THERAPY DIAG:  Pain in left lower leg  Muscle weakness (generalized)  Difficulty in walking, not elsewhere classified  Rationale for Evaluation and Treatment: Rehabilitation  ONSET DATE: Over a month  SUBJECTIVE:   SUBJECTIVE STATEMENT: Pt reports her L leg gave out on her and she fell backwards at church. She notes she was not having an issue with the L  leg prior to the fall. After fall she started experiencing L thigh and lower leg pain, and currently is having difficulty lifting her L LE.Denies NT.  PERTINENT HISTORY: DM2, high BMI PAIN:  Are you having pain? Yes: NPRS scale: 0/10. Took muscle relaxer last night Pain location: L thigh and lower leg Pain description: ache, sharp Aggravating factors: Trying to eep, sometimes during the day  Relieving factors: Muscle relaxer Avg pain is a 6/10  PRECAUTIONS: None  RED FLAGS: None   WEIGHT BEARING RESTRICTIONS: No  FALLS:  Has patient fallen in last 6 months? Yes. Number of falls 1 Reason for referral   LIVING ENVIRONMENT: Lives with: lives alone Lives in: House/apartment Stairs: No Has following equipment at home: None  OCCUPATION: Not working  PLOF: Independent  PATIENT GOALS: Getting rid of the pain  NEXT MD VISIT: Not scheduled  OBJECTIVE:  Note: Objective measures were completed at Evaluation unless otherwise noted.  DIAGNOSTIC FINDINGS:  11/28/23 MRI Lumbar spine IMPRESSION: 1. No acute or traumatic finding. 2. L4-5: Chronic disc degeneration with loss of disc height and vacuum phenomenon. Endplate osteophytes and bulging of the disc. Facet degeneration and hypertrophy. Narrowing of the lateral recesses and foramina, right worse than left. Neural compression could occur at this level, more likely on the right. 3. L3-4: Bulging of the disc more prominent towards the left.  Facet and ligamentous hypertrophy. Mild stenosis of the left lateral recess and foramen on the left but no definite neural compression. 4. L5-S1: Mild bulging of the disc. Bilateral facet osteoarthritis. No compressive canal or foraminal narrowing. 5. 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  PATIENT SURVEYS:  LEFS 13/80=16%  COGNITION: Overall cognitive status: Within functional limits for tasks assessed     SENSATION: WFL  EDEMA:  None observed  MUSCLE LENGTH: Hamstrings: Right  WNLs deg; Left WNLs deg Andy Bannister test: Right NT deg; Left NT deg  POSTURE: rounded shoulders and forward head  PALPATION: TTP  TRUNK AROM:  Flexion , etension WNL no pain   Rt and Lt. Lateral flexion  LOWER EXTREMITY ROM: WFLs Active ROM Right eval Left eval  Hip flexion    Hip extension    Hip abduction    Hip adduction    Hip internal rotation    Hip external rotation    Knee flexion    Knee extension    Ankle dorsiflexion    Ankle plantarflexion    Ankle inversion    Ankle eversion     (Blank rows = not tested)  LOWER EXTREMITY MMT:  MMT Right eval Left eval  Hip flexion 4+ 2  Hip extension    Hip abduction 4+ 3-  Hip adduction    Hip internal rotation    Hip external rotation 4+ 4+  Knee flexion 5 4  Knee extension 5 4  Ankle dorsiflexion    Ankle plantarflexion    Ankle inversion    Ankle eversion     (Blank rows = not tested)  LOWER EXTREMITY SPECIAL TESTS:  SLR; nedgative  FUNCTIONAL TESTS:  5 times sit to stand: 19 sec  2 minute walk test: TBA  GAIT: Distance walked: 200' Assistive device utilized: None Level of assistance: Complete Independence Comments: WNLs                                                                                                                TREATMENT DATE:   [facility] Adult PT Treatment:                                                DATE: 01/28/24 Manual Therapy: Hamstring stretch, PROM hips in supine  LAD LLE x 6 varying angles  Therapeutic Activity: Supine unable to SLR Sidelying clam , unable to hip abduct in sidelying  Standing hip flexor stretch  Stand hip abd, march x 15 each  Supine hip flexor LLE for 1 min  Quad set x 10, 5 sec  Ball squeeze  x 10  Bridging  x 10  Self Care: Lumbar myotome referral pattern.    [facility] Adult PT Treatment:  DATE: 01/16/24 Therapeutic Exercise: Developed, instructed in, and pt completed therex as noted in HEP   PATIENT  EDUCATION:  Education details: Eval findings, POC, HEP Person educated: Patient Education method: Explanation, Demonstration, Tactile cues, and Verbal cues Education comprehension: verbalized understanding, returned demonstration, verbal cues required, and tactile cues required  HOME EXERCISE PROGRAM: Access Code: [email or web address] URL: [email or web address] Date: 01/28/2024 Prepared by: Marci Setter  Exercises - Standing March with Counter Support  - 1 x daily - 7 x weekly - 2-3 sets - 10 reps - Standing Hip Flexion with Counter Support  - 1 x daily - 7 x weekly - 2-3 sets - 10 reps - Standing Hip Abduction with Counter Support  - 1 x daily - 7 x weekly - 2-3 sets - 10 reps - Bridge  - 1 x daily - 7 x weekly - 2 sets - 10 reps - 5 hold - Hooklying Single Knee to Chest  - 1 x daily - 7 x weekly - 1 sets - 3-5 reps - 30 hold - Supine Posterior Pelvic Tilt  - 1 x daily - 7 x weekly - 2 sets - 10 reps - 5 hold  ASSESSMENT:  CLINICAL IMPRESSION:  Patient with bilateral walking left more than right lower extremity fatigue and thigh pain.  She has no pain in her back but I do believe her symptoms are coming from her lumbar spine.  She has decreased motor coordination needing heavy cues for standing exercises she was given supine exercises as well to work on core stability and strength.  She is considering purchasing a cane for better stability with gait.   Patient is a 68 y.o. female who was seen today for physical therapy evaluation and treatment for M79.605 (ICD-10-CM) - Left leg pain. REFERRAL NOTE: Patient had a ground level fall in March, CT scan with osteoarthritis and pinched nerve on left leading to left leg pain and sciatica. Patient is now having difficulties lifting up her left leg, especially to step up. In need of evaluation for safety and pain management.  Pt presents to pt with trunk movements WNLs and did not reproduce pain. SLR test, L and R is negative. L hip flexion and  abduction were found to be weak. Gait pattern was WNLs. A HEP to address L LE weakness was initiated. Pt will benefit from skilled PT to address impairments to optimize L LE function with less pain.   OBJECTIVE IMPAIRMENTS: decreased activity tolerance, difficulty walking, decreased strength, obesity, and pain.   ACTIVITY LIMITATIONS: carrying, lifting, bending, standing, squatting, and locomotion level  PARTICIPATION LIMITATIONS: meal prep, cleaning, laundry, community activity, and church  PERSONAL FACTORS: Past/current experiences, Time since onset of injury/illness/exacerbation, and 1-2 comorbidities: DM2, high BMI are also affecting patient's functional outcome.   REHAB POTENTIAL: Good  CLINICAL DECISION MAKING: Evolving/moderate complexity  EVALUATION COMPLEXITY: Moderate   GOALS:   SHORT TERM GOALS = LTGs  LONG TERM GOALS: Target date: 03/05/24  Pt will be Ind in a final HEP to maintain achieved LOF  Baseline: started Goal status: INITIAL  2.  Increase hip flexion and abduction strength to 4/5 fro improved function with ADLs Baseline:  Goal status: INITIAL  3.  Improve 5xSTS by MCID of 5" and by MCID of 99ft as indication of improved functional mobility  Baseline: TBA Goal status: INITIAL  4.  Pt's LEFS score will improved to 40% or greater as indication of improved function  Baseline: 16% Goal status: INITIAL    PLAN:  PT FREQUENCY: 2x/week  PT DURATION: 6 weeks  PLANNED INTERVENTIONS: 97164- PT Re-evaluation, 97110-Therapeutic exercises, 97530- Therapeutic activity, 97112- Neuromuscular re-education, 97535- Self Care, 96045- Manual therapy, [phone]- Gait training, [phone]- Aquatic Therapy, [phone]- Electrical stimulation (unattended), Patient/Family education, Balance training, Stair training, Taping, Dry Needling, Joint mobilization, Spinal mobilization, Cryotherapy, and Moist heat  PLAN FOR NEXT SESSION: ; assess response to HEP; progress therex as  indicated; use of modalities, manual therapy; and TPDN as indicated.   Marci Setter, PT 01/28/24 11:02 AM Phone: [phone] Fax: [phone]

## 2024-01-29 NOTE — Therapy (Deleted)
 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  Rationale for Evaluation and Treatment: Rehabilitation  ONSET DATE: Over a month  SUBJECTIVE:   SUBJECTIVE STATEMENT: Pt reports her L leg gave out on her and she fell backwards at church. She notes she was not having an issue with the L leg prior to the fall. After fall she started experiencing L thigh and lower leg pain, and currently is having difficulty lifting her L LE.Denies NT.  PERTINENT HISTORY: DM2, high BMI PAIN:  Are you having pain? Yes: NPRS scale: 0/10. Took muscle relaxer last night Pain location: L thigh and lower leg Pain description: ache, sharp Aggravating factors: Trying to eep, sometimes during the day  Relieving factors: Muscle relaxer Avg pain is a 6/10  PRECAUTIONS: None  RED  FLAGS: None   WEIGHT BEARING RESTRICTIONS: No  FALLS:  Has patient fallen in last 6 months? Yes. Number of falls 1 Reason for referral   LIVING ENVIRONMENT: Lives with: lives alone Lives in: House/apartment Stairs: No Has following equipment at home: None  OCCUPATION: Not working  PLOF: Independent  PATIENT GOALS: Getting rid of the pain  NEXT MD VISIT: Not scheduled  OBJECTIVE:  Note: Objective measures were completed at Evaluation unless otherwise noted.  DIAGNOSTIC FINDINGS:  11/28/23 MRI Lumbar spine IMPRESSION: 1. No acute or traumatic finding. 2. L4-5: Chronic disc degeneration with loss of disc height and vacuum phenomenon. Endplate osteophytes and bulging of the disc. Facet degeneration and hypertrophy. Narrowing of the lateral recesses and foramina, right worse than left. Neural compression could occur at this level, more likely on the right. 3. L3-4: Bulging of the disc more prominent towards the left. Facet and ligamentous hypertrophy. Mild stenosis of the left lateral recess and foramen on the left but no definite neural compression. 4. L5-S1: Mild bulging of the disc. Bilateral facet osteoarthritis. No compressive canal or foraminal narrowing. 5. 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  PATIENT SURVEYS:  LEFS 13/80=16%  COGNITION: Overall cognitive status: Within functional limits for tasks assessed     SENSATION: WFL  EDEMA:  None observed  MUSCLE LENGTH: Hamstrings: Right WNLs deg; Left WNLs deg Andy Bannister test: Right NT deg; Left NT deg  POSTURE: rounded shoulders and forward head  PALPATION: TTP  TRUNK AROM:  Flexion , etension WNL no pain  Rt and Lt. Lateral flexion  LOWER EXTREMITY ROM: WFLs Active ROM Right eval Left eval  Hip flexion    Hip extension    Hip abduction    Hip adduction    Hip internal rotation    Hip external rotation    Knee flexion    Knee extension    Ankle dorsiflexion    Ankle plantarflexion    Ankle  inversion    Ankle eversion     (Blank rows = not tested)  LOWER EXTREMITY MMT:  MMT Right eval Left eval  Hip flexion 4+ 2  Hip extension    Hip abduction 4+ 3-  Hip adduction    Hip internal rotation    Hip external rotation 4+ 4+  Knee flexion 5 4  Knee extension 5 4  Ankle dorsiflexion    Ankle plantarflexion    Ankle inversion    Ankle eversion     (Blank rows = not tested)  LOWER EXTREMITY SPECIAL TESTS:  SLR; nedgative  FUNCTIONAL TESTS:  5 times sit to stand: 19 sec  2 minute walk test: TBA  GAIT: Distance walked: 200' Assistive device utilized: None Level of assistance: Complete Independence Comments: WNLs                                                                                                                TREATMENT DATE:    [facility] Adult PT Treatment:                                                DATE: 01/30/24 Therapeutic Exercise: *** Manual Therapy: *** Neuromuscular re-ed: *** Therapeutic Activity: *** Modalities: *** Self Care: ***  [facility] Adult PT Treatment:                                                DATE: 01/28/24 Manual Therapy: Hamstring stretch, PROM hips in supine  LAD LLE x 6 varying angles  Therapeutic Activity: Supine unable to SLR Sidelying clam , unable to hip abduct in sidelying  Standing hip flexor stretch  Stand hip abd, march x 15 each  Supine hip flexor LLE for 1 min  Quad set x 10, 5 sec  Ball squeeze  x 10  Bridging  x 10  Self Care: Lumbar myotome referral pattern.    [facility] Adult PT Treatment:                                                DATE: 01/16/24 Therapeutic Exercise: Developed, instructed in, and pt completed therex as noted in HEP   PATIENT EDUCATION:  Education details: Eval  findings, POC, HEP Person educated: Patient Education method: Explanation, Demonstration, Tactile cues, and Verbal cues Education comprehension: verbalized understanding, returned demonstration, verbal cues required, and  tactile cues required  HOME EXERCISE PROGRAM: Access Code: [email or web address] URL: [email or web address] Date: 01/28/2024 Prepared by: Marci Setter  Exercises - Standing March with Counter Support  - 1 x daily - 7 x weekly - 2-3 sets - 10 reps - Standing Hip Flexion with Counter Support  - 1 x daily - 7 x weekly - 2-3 sets - 10 reps - Standing Hip Abduction with Counter Support  - 1 x daily - 7 x weekly - 2-3 sets - 10 reps - Bridge  - 1 x daily - 7 x weekly - 2 sets - 10 reps - 5 hold - Hooklying Single Knee to Chest  - 1 x daily - 7 x weekly - 1 sets - 3-5 reps - 30 hold - Supine Posterior Pelvic Tilt  - 1 x daily - 7 x weekly - 2 sets - 10 reps - 5 hold  ASSESSMENT:  CLINICAL IMPRESSION:  Patient with bilateral walking left more than right lower extremity fatigue and thigh pain.  She has no pain in her back but I do believe her symptoms are coming from her lumbar spine.  She has decreased motor coordination needing heavy cues for standing exercises she was given supine exercises as well to work on core stability and strength.  She is considering purchasing a cane for better stability with gait.   Patient is a 68 y.o. female who was seen today for physical therapy evaluation and treatment for M79.605 (ICD-10-CM) - Left leg pain. REFERRAL NOTE: Patient had a ground level fall in March, CT scan with osteoarthritis and pinched nerve on left leading to left leg pain and sciatica. Patient is now having difficulties lifting up her left leg, especially to step up. In need of evaluation for safety and pain management.  Pt presents to pt with trunk movements WNLs and did not reproduce pain. SLR test, L and R is negative. L hip flexion and abduction were found to be weak. Gait pattern was WNLs. A HEP to address L LE weakness was initiated. Pt will benefit from skilled PT to address impairments to optimize L LE function with less pain.   OBJECTIVE IMPAIRMENTS: decreased activity tolerance,  difficulty walking, decreased strength, obesity, and pain.   ACTIVITY LIMITATIONS: carrying, lifting, bending, standing, squatting, and locomotion level  PARTICIPATION LIMITATIONS: meal prep, cleaning, laundry, community activity, and church  PERSONAL FACTORS: Past/current experiences, Time since onset of injury/illness/exacerbation, and 1-2 comorbidities: DM2, high BMI are also affecting patient's functional outcome.   REHAB POTENTIAL: Good  CLINICAL DECISION MAKING: Evolving/moderate complexity  EVALUATION COMPLEXITY: Moderate   GOALS:   SHORT TERM GOALS = LTGs  LONG TERM GOALS: Target date: 03/05/24  Pt will be Ind in a final HEP to maintain achieved LOF  Baseline: started Goal status: INITIAL  2.  Increase hip flexion and abduction strength to 4/5 fro improved function with ADLs Baseline:  Goal status: INITIAL  3.  Improve 5xSTS by MCID of 5" and by MCID of 35ft as indication of improved functional mobility  Baseline: TBA Goal status: INITIAL  4.  Pt's LEFS score will improved to 40% or greater as indication of improved function  Baseline: 16% Goal status: INITIAL    PLAN:  PT FREQUENCY: 2x/week  PT DURATION: 6 weeks  PLANNED INTERVENTIONS: 97164- PT Re-evaluation, 97110-Therapeutic exercises, 97530- Therapeutic activity, 97112- Neuromuscular re-education,  96045- Self Care, 40981- Manual therapy, [id number]- Gait training, [phone]- Aquatic Therapy, [phone]- Electrical stimulation (unattended), Patient/Family education, Balance training, Stair training, Taping, Dry Needling, Joint mobilization, Spinal mobilization, Cryotherapy, and Moist heat  PLAN FOR NEXT SESSION: ; assess response to HEP; progress therex as indicated; use of modalities, manual therapy; and TPDN as indicated.   Marci Setter, PT 01/29/24 10:30 AM Phone: [phone] Fax: [phone]

## 2024-01-30 ENCOUNTER — Ambulatory Visit: Admitting: Physical Therapy

## 2024-02-02 ENCOUNTER — Ambulatory Visit: Admitting: Physical Therapy

## 2024-02-02 ENCOUNTER — Telehealth: Payer: Self-pay | Admitting: Physical Therapy

## 2024-02-02 NOTE — Telephone Encounter (Signed)
 Left voice mail regarding no show and attendance policy. Left one more appointment on schedule and requested call back if she would like to cancel or reschedule.

## 2024-02-04 ENCOUNTER — Encounter: Payer: Self-pay | Admitting: Physical Therapy

## 2024-02-04 ENCOUNTER — Ambulatory Visit: Admitting: Physical Therapy

## 2024-02-04 DIAGNOSIS — M79662 Pain in left lower leg: Secondary | ICD-10-CM | POA: Diagnosis not present

## 2024-02-04 DIAGNOSIS — M6281 Muscle weakness (generalized): Secondary | ICD-10-CM

## 2024-02-04 DIAGNOSIS — M79605 Pain in left leg: Secondary | ICD-10-CM | POA: Diagnosis not present

## 2024-02-04 DIAGNOSIS — R262 Difficulty in walking, not elsewhere classified: Secondary | ICD-10-CM

## 2024-02-04 NOTE — Therapy (Signed)
 OUTPATIENT PHYSICAL THERAPY LOWER EXTREMITY NOTE   Patient Name: Colleen Williams MRN: [id number] DOB:Nov 22, 1955, 68 y.o., female Today's Date: 02/04/2024  END OF SESSION:  PT End of Session - 02/04/24 1012     Visit Number 3    Number of Visits 13    Date for PT Re-Evaluation 03/05/24    Authorization Type [employer]    Authorization - Visit Number 3    Authorization - Number of Visits 27    PT Start Time 1015    PT Stop Time 1100    PT Time Calculation (min) 45 min               Past Medical History:  Diagnosis Date   Arthritis    Diabetes mellitus without complication (HCC)    Hyperlipidemia    Hypertension    Vitreous floaters of both eyes 08/03/2017   Past Surgical History:  Procedure Laterality Date   COLONOSCOPY     TUBAL LIGATION     Patient Active Problem List   Diagnosis Date Noted   Left leg pain 01/13/2024   Right sided abdominal pain 11/07/2023   Thickened endometrium 10/31/2023   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   Adjustment disorder 10/30/2023   Morbid obesity (HCC) 04/24/2022   Back pain 03/15/2020   Hyperlipidemia associated with type 2 diabetes mellitus (HCC) 11/12/2019   Essential hypertension 08/03/2017   Type 2 diabetes mellitus (HCC) 01/30/2017   Osteoarthritis of ankle and foot 07/30/2016   Healthcare maintenance 06/12/2015    PCP: Arellano Zameza, Priscila, MD   REFERRING PROVIDER: Sherol Dixie, MD   REFERRING DIAG: [phone] (ICD-10-CM) - Left leg pain   THERAPY DIAG:  Pain in left lower leg  Muscle weakness (generalized)  Difficulty in walking, not elsewhere classified  Rationale for Evaluation and Treatment: Rehabilitation  ONSET DATE: Over a month  SUBJECTIVE:   SUBJECTIVE STATEMENT: 02/04/24: No pain on arrival. Unable to climb stairs. Maybe over doing the exercises. I over slept last visit.    Pt reports her L leg gave out on her and she fell backwards at  church. She notes she was not having an issue with the L leg prior to the fall. After fall she started experiencing L thigh and lower leg pain, and currently is having difficulty lifting her L LE.Denies NT.  PERTINENT HISTORY: DM2, high BMI PAIN:  Are you having pain? Yes: NPRS scale: 0/10. Took muscle relaxer last night Pain location: L thigh and lower leg Pain description: ache, sharp Aggravating factors: Trying to eep, sometimes during the day  Relieving factors: Muscle relaxer Avg pain is a 6/10  PRECAUTIONS: None  RED FLAGS: None   WEIGHT BEARING RESTRICTIONS: No  FALLS:  Has patient fallen in last 6 months? Yes. Number of falls 1 Reason for referral   LIVING ENVIRONMENT: Lives with: lives alone Lives in: House/apartment Stairs: No Has following equipment at home: None  OCCUPATION: Not working  PLOF: Independent  PATIENT GOALS: Getting rid of the pain  NEXT MD VISIT: Not scheduled  OBJECTIVE:  Note: Objective measures were completed at Evaluation unless otherwise noted.  DIAGNOSTIC FINDINGS:  11/28/23 MRI Lumbar spine IMPRESSION: 1. No acute or traumatic finding. 2. L4-5: Chronic disc degeneration with loss of disc height and vacuum phenomenon. Endplate osteophytes and bulging of the disc. Facet degeneration and hypertrophy. Narrowing of the lateral recesses and foramina, right worse than left. Neural compression could occur at this level, more likely on  the right. 3. L3-4: Bulging of the disc more prominent towards the left. Facet and ligamentous hypertrophy. Mild stenosis of the left lateral recess and foramen on the left but no definite neural compression. 4. L5-S1: Mild bulging of the disc. Bilateral facet osteoarthritis. No compressive canal or foraminal narrowing. 5. 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  PATIENT SURVEYS:  LEFS 13/80=16%  COGNITION: Overall cognitive status: Within functional limits for tasks  assessed     SENSATION: WFL  EDEMA:  None observed  MUSCLE LENGTH: Hamstrings: Right WNLs deg; Left WNLs deg Andy Bannister test: Right NT deg; Left NT deg  POSTURE: rounded shoulders and forward head  PALPATION: TTP  TRUNK AROM:  Flexion , etension WNL no pain   Rt and Lt. Lateral flexion  LOWER EXTREMITY ROM: WFLs Active ROM Right eval Left eval  Hip flexion    Hip extension    Hip abduction    Hip adduction    Hip internal rotation    Hip external rotation    Knee flexion    Knee extension    Ankle dorsiflexion    Ankle plantarflexion    Ankle inversion    Ankle eversion     (Blank rows = not tested)  LOWER EXTREMITY MMT:  MMT Right eval Left eval  Hip flexion 4+ 2  Hip extension    Hip abduction 4+ 3-  Hip adduction    Hip internal rotation    Hip external rotation 4+ 4+  Knee flexion 5 4  Knee extension 5 4  Ankle dorsiflexion    Ankle plantarflexion    Ankle inversion    Ankle eversion     (Blank rows = not tested)  LOWER EXTREMITY SPECIAL TESTS:  SLR; nedgative  FUNCTIONAL TESTS:  5 times sit to stand: 19 sec  2 minute walk test: TBA 02/04/24: 2 MWT:  392 feet   GAIT: Distance walked: 200' Assistive device utilized: None Level of assistance: Complete Independence Comments: WNLs                                                                                                                TREATMENT DATE:    [facility] Adult PT Treatment:                                                DATE: 02/04/24  Therapeutic Activity: 2 MWT STS x 10  Standing hip SLR 10 x 2 each  Standing hip abduction 5 x 2 each  Standing heel raise x 10  Bridge 10 x 2  LTR  Supine clam green band  PPT  Supine ball squeeze with PPT  [facility] Adult PT Treatment:  DATE: 01/28/24 Manual Therapy: Hamstring stretch, PROM hips in supine  LAD LLE x 6 varying angles  Therapeutic Activity: Supine unable to SLR Sidelying clam ,  unable to hip abduct in sidelying  Standing hip flexor stretch  Stand hip abd, march x 15 each  Supine hip flexor LLE for 1 min  Quad set x 10, 5 sec  Ball squeeze  x 10  Bridging  x 10  Self Care: Lumbar myotome referral pattern.    [facility] Adult PT Treatment:                                                DATE: 01/16/24 Therapeutic Exercise: Developed, instructed in, and pt completed therex as noted in HEP   PATIENT EDUCATION:  Education details: Eval findings, POC, HEP Person educated: Patient Education method: Explanation, Demonstration, Tactile cues, and Verbal cues Education comprehension: verbalized understanding, returned demonstration, verbal cues required, and tactile cues required  HOME EXERCISE PROGRAM: Access Code: [email or web address] URL: [email or web address] Date: 01/28/2024 Prepared by: Marci Setter  Exercises - Standing March with Counter Support  - 1 x daily - 7 x weekly - 2-3 sets - 10 reps - Standing Hip Flexion with Counter Support  - 1 x daily - 7 x weekly - 2-3 sets - 10 reps - Standing Hip Abduction with Counter Support  - 1 x daily - 7 x weekly - 2-3 sets - 10 reps - Bridge  - 1 x daily - 7 x weekly - 2 sets - 10 reps - 5 hold - Hooklying Single Knee to Chest  - 1 x daily - 7 x weekly - 1 sets - 3-5 reps - 30 hold - Supine Posterior Pelvic Tilt  - 1 x daily - 7 x weekly - 2 sets - 10 reps - 5 hold Added - Sit to stand with control  - 2-3 x daily - 7 x weekly - 1-2 sets - 10 reps   ASSESSMENT:  CLINICAL IMPRESSION:  Pt reports compliance with HEP and no pain on arrival. Does have ongoing left thigh pain. Feels muscles burning in bilateral thighs during standing hip exercises. Captured 2 MWT at 392 feet without AD. Pt with question about using SPC so trial of clinic cane and correct use and adjustment discussed in clinic. Continued with general lumbopelvic mobility and activation with pt doing well, just fatigues and has muscle burning throughout session.  Will continue to progress as tolerated.     Patient is a 68 y.o. female who was seen today for physical therapy evaluation and treatment for M79.605 (ICD-10-CM) - Left leg pain. REFERRAL NOTE: Patient had a ground level fall in March, CT scan with osteoarthritis and pinched nerve on left leading to left leg pain and sciatica. Patient is now having difficulties lifting up her left leg, especially to step up. In need of evaluation for safety and pain management.  Pt presents to pt with trunk movements WNLs and did not reproduce pain. SLR test, L and R is negative. L hip flexion and abduction were found to be weak. Gait pattern was WNLs. A HEP to address L LE weakness was initiated. Pt will benefit from skilled PT to address impairments to optimize L LE function with less pain.   OBJECTIVE IMPAIRMENTS: decreased activity tolerance, difficulty walking, decreased strength, obesity, and pain.   ACTIVITY LIMITATIONS: carrying, lifting, bending,  standing, squatting, and locomotion level  PARTICIPATION LIMITATIONS: meal prep, cleaning, laundry, community activity, and church  PERSONAL FACTORS: Past/current experiences, Time since onset of injury/illness/exacerbation, and 1-2 comorbidities: DM2, high BMI are also affecting patient's functional outcome.   REHAB POTENTIAL: Good  CLINICAL DECISION MAKING: Evolving/moderate complexity  EVALUATION COMPLEXITY: Moderate   GOALS:   SHORT TERM GOALS = LTGs  LONG TERM GOALS: Target date: 03/05/24  Pt will be Ind in a final HEP to maintain achieved LOF  Baseline: started Goal status: INITIAL  2.  Increase hip flexion and abduction strength to 4/5 fro improved function with ADLs Baseline:  Goal status: INITIAL  3.  Improve 5xSTS by MCID of 5" and by MCID of 15ft as indication of improved functional mobility  Baseline: TBA 01/28/24: 19 sec 02/04/24: 392 feet Goal status: ONGOING  4.  Pt's LEFS score will improved to 40% or greater as indication  of improved function  Baseline: 16% Goal status: INITIAL    PLAN:  PT FREQUENCY: 2x/week  PT DURATION: 6 weeks  PLANNED INTERVENTIONS: 97164- PT Re-evaluation, 97110-Therapeutic exercises, 97530- Therapeutic activity, 97112- Neuromuscular re-education, 97535- Self Care, 72536- Manual therapy, [phone]- Gait training, [phone]- Aquatic Therapy, [phone]- Electrical stimulation (unattended), Patient/Family education, Balance training, Stair training, Taping, Dry Needling, Joint mobilization, Spinal mobilization, Cryotherapy, and Moist heat  PLAN FOR NEXT SESSION:assess response to HEP; progress therex as indicated; use of modalities, manual therapy; and TPDN as indicated.   Gasper Karst, PTA 02/04/24 12:55 PM Phone: [phone] Fax: [phone]

## 2024-02-11 ENCOUNTER — Ambulatory Visit: Admitting: Physical Therapy

## 2024-02-13 ENCOUNTER — Ambulatory Visit: Admitting: Physical Therapy

## 2024-02-17 NOTE — Therapy (Signed)
 OUTPATIENT PHYSICAL THERAPY LOWER EXTREMITY NOTE   Patient Name: Colleen Williams MRN: [id number] DOB:19-Feb-1956, 68 y.o., female Today's Date: 02/18/2024  END OF SESSION:  PT End of Session - 02/18/24 1431     Visit Number 4    Number of Visits 13    Date for PT Re-Evaluation 03/05/24    Authorization Type [employer] DUAL COMPLETE    Authorization - Visit Number 4    Authorization - Number of Visits 27    PT Start Time 1420    PT Stop Time 1500    PT Time Calculation (min) 40 min                Past Medical History:  Diagnosis Date   Arthritis    Diabetes mellitus without complication (HCC)    Hyperlipidemia    Hypertension    Vitreous floaters of both eyes 08/03/2017   Past Surgical History:  Procedure Laterality Date   COLONOSCOPY     TUBAL LIGATION     Patient Active Problem List   Diagnosis Date Noted   Left leg pain 01/13/2024   Right sided abdominal pain 11/07/2023   Thickened endometrium 10/31/2023   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   Adjustment disorder 10/30/2023   Morbid obesity (HCC) 04/24/2022   Back pain 03/15/2020   Hyperlipidemia associated with type 2 diabetes mellitus (HCC) 11/12/2019   Essential hypertension 08/03/2017   Type 2 diabetes mellitus (HCC) 01/30/2017   Osteoarthritis of ankle and foot 07/30/2016   Healthcare maintenance 06/12/2015    PCP: Arellano Zameza, Priscila, MD   REFERRING PROVIDER: Sherol Dixie, MD   REFERRING DIAG: [phone] (ICD-10-CM) - Left leg pain   THERAPY DIAG:  Pain in left lower leg  Muscle weakness (generalized)  Difficulty in walking, not elsewhere classified  Rationale for Evaluation and Treatment: Rehabilitation  ONSET DATE: Over a month  SUBJECTIVE:   SUBJECTIVE STATEMENT: 02/18/24: Pt reports therapy is helping her and the cane she obtained is helping with her balance   Pt reports her L leg gave out on her and she fell backwards at church. She  notes she was not having an issue with the L leg prior to the fall. After fall she started experiencing L thigh and lower leg pain, and currently is having difficulty lifting her L LE.Denies NT.  PERTINENT HISTORY: DM2, high BMI PAIN:  Are you having pain? Yes: NPRS scale: 0/10. Took muscle relaxer last night Pain location: L thigh and lower leg Pain description: ache, sharp Aggravating factors: Trying to eep, sometimes during the day  Relieving factors: Muscle relaxer Avg pain is a 6/10  PRECAUTIONS: None  RED FLAGS: None   WEIGHT BEARING RESTRICTIONS: No  FALLS:  Has patient fallen in last 6 months? Yes. Number of falls 1 Reason for referral   LIVING ENVIRONMENT: Lives with: lives alone Lives in: House/apartment Stairs: No Has following equipment at home: None  OCCUPATION: Not working  PLOF: Independent  PATIENT GOALS: Getting rid of the pain  NEXT MD VISIT: Not scheduled  OBJECTIVE:  Note: Objective measures were completed at Evaluation unless otherwise noted.  DIAGNOSTIC FINDINGS:  11/28/23 MRI Lumbar spine IMPRESSION: 1. No acute or traumatic finding. 2. L4-5: Chronic disc degeneration with loss of disc height and vacuum phenomenon. Endplate osteophytes and bulging of the disc. Facet degeneration and hypertrophy. Narrowing of the lateral recesses and foramina, right worse than left. Neural compression could occur at this level, more likely on the right.  3. L3-4: Bulging of the disc more prominent towards the left. Facet and ligamentous hypertrophy. Mild stenosis of the left lateral recess and foramen on the left but no definite neural compression. 4. L5-S1: Mild bulging of the disc. Bilateral facet osteoarthritis. No compressive canal or foraminal narrowing. 5. 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  PATIENT SURVEYS:  LEFS 13/80=16%  COGNITION: Overall cognitive status: Within functional limits for tasks assessed     SENSATION: WFL  EDEMA:  None  observed  MUSCLE LENGTH: Hamstrings: Right WNLs deg; Left WNLs deg Andy Bannister test: Right NT deg; Left NT deg  POSTURE: rounded shoulders and forward head  PALPATION: TTP  TRUNK AROM:  Flexion , etension WNL no pain   Rt and Lt. Lateral flexion  LOWER EXTREMITY ROM: WFLs Active ROM Right eval Left eval  Hip flexion    Hip extension    Hip abduction    Hip adduction    Hip internal rotation    Hip external rotation    Knee flexion    Knee extension    Ankle dorsiflexion    Ankle plantarflexion    Ankle inversion    Ankle eversion     (Blank rows = not tested)  LOWER EXTREMITY MMT:  MMT Right eval Left eval  Hip flexion 4+ 2  Hip extension    Hip abduction 4+ 3-  Hip adduction    Hip internal rotation    Hip external rotation 4+ 4+  Knee flexion 5 4  Knee extension 5 4  Ankle dorsiflexion    Ankle plantarflexion    Ankle inversion    Ankle eversion     (Blank rows = not tested)  LOWER EXTREMITY SPECIAL TESTS:  SLR; nedgative  FUNCTIONAL TESTS:  5 times sit to stand: 19 sec  2 minute walk test: TBA 02/04/24: 2 MWT:  392 feet   GAIT: Distance walked: 200' Assistive device utilized: None Level of assistance: Complete Independence Comments: WNLs                                                                                                                TREATMENT DATE:  [facility] Adult PT Treatment:                                                DATE: 02/18/24 Therapeutic Activity: SKTC x1 30" LTR x5 5" Bridge 10 x 2  PPT x10 Supine clam green band x15  Supine ball squeeze with PPT x15 STS x 10   Standing hip abduction x15 each  Standing heel raise x15 SLS Tandem standing Self Care: Pt Ed for proper use [facility]   [facility] Adult PT Treatment:  DATE: 02/04/24 Therapeutic Activity: 2 MWT STS x 10  Standing hip SLR 10 x 2 each  Standing hip abduction 5 x 2 each  Standing heel raise x 10  Bridge 10 x 2  LTR   Supine clam green band  PPT  Supine ball squeeze with PPT  [facility] Adult PT Treatment:                                                DATE: 01/28/24 Manual Therapy: Hamstring stretch, PROM hips in supine  LAD LLE x 6 varying angles  Therapeutic Activity: Supine unable to SLR Sidelying clam , unable to hip abduct in sidelying  Standing hip flexor stretch  Stand hip abd, march x 15 each  Supine hip flexor LLE for 1 min  Quad set x 10, 5 sec  Ball squeeze  x 10  Bridging  x 10  Self Care: Lumbar myotome referral pattern.    [facility] Adult PT Treatment:                                                DATE: 01/16/24 Therapeutic Exercise: Developed, instructed in, and pt completed therex as noted in HEP   PATIENT EDUCATION:  Education details: Eval findings, POC, HEP Person educated: Patient Education method: Explanation, Demonstration, Tactile cues, and Verbal cues Education comprehension: verbalized understanding, returned demonstration, verbal cues required, and tactile cues required  HOME EXERCISE PROGRAM: Access Code: [email or web address] URL: [email or web address] Date: 02/18/2024 Prepared by: Liborio Reeds  Exercises - Standing March with Counter Support  - 1 x daily - 7 x weekly - 2-3 sets - 10 reps - Standing Hip Flexion with Counter Support  - 1 x daily - 7 x weekly - 2-3 sets - 10 reps - Standing Hip Abduction with Counter Support  - 1 x daily - 7 x weekly - 2-3 sets - 10 reps - Bridge  - 1 x daily - 7 x weekly - 2 sets - 10 reps - 5 hold - Hooklying Single Knee to Chest  - 1 x daily - 7 x weekly - 1 sets - 3-5 reps - 30 hold - Supine Posterior Pelvic Tilt  - 1 x daily - 7 x weekly - 2 sets - 10 reps - 5 hold - Sit to stand with control  - 2-3 x daily - 7 x weekly - 1-2 sets - 10 reps - Hooklying Clamshell with Resistance  - 1 x daily - 7 x weekly - 2 sets - 10 reps - 3 hold - Supine Hip Adduction Isometric with Ball  - 1 x daily - 7 x weekly - 2 sets - 10 reps - 3 hold -  Standing Single Leg Stance with Counter Support  - 1 x daily - 7 x weekly - 1 sets - 3 reps - 30 hold - Standing Tandem Balance with Counter Support  - 1 x daily - 7 x weekly - 1 sets - 3 reps - 30 hold  ASSESSMENT:  CLINICAL IMPRESSION:  Pt presents to PT using a SPC. Pt Ed was provided for proper use of SPC which pt returned demonstration. PT was completed for LE strengthening and balance. Pt's HEP was updated. Pt tolerated prescribed exercises  in PT today without adverse effects. Pt will continue to benefit from skilled PT to address impairments for improved function. Will assess LTGs the next PT visit.    Patient is a 68 y.o. female who was seen today for physical therapy evaluation and treatment for M79.605 (ICD-10-CM) - Left leg pain. REFERRAL NOTE: Patient had a ground level fall in March, CT scan with osteoarthritis and pinched nerve on left leading to left leg pain and sciatica. Patient is now having difficulties lifting up her left leg, especially to step up. In need of evaluation for safety and pain management.  Pt presents to pt with trunk movements WNLs and did not reproduce pain. SLR test, L and R is negative. L hip flexion and abduction were found to be weak. Gait pattern was WNLs. A HEP to address L LE weakness was initiated. Pt will benefit from skilled PT to address impairments to optimize L LE function with less pain.   OBJECTIVE IMPAIRMENTS: decreased activity tolerance, difficulty walking, decreased strength, obesity, and pain.   ACTIVITY LIMITATIONS: carrying, lifting, bending, standing, squatting, and locomotion level  PARTICIPATION LIMITATIONS: meal prep, cleaning, laundry, community activity, and church  PERSONAL FACTORS: Past/current experiences, Time since onset of injury/illness/exacerbation, and 1-2 comorbidities: DM2, high BMI are also affecting patient's functional outcome.   REHAB POTENTIAL: Good  CLINICAL DECISION MAKING: Evolving/moderate  complexity  EVALUATION COMPLEXITY: Moderate   GOALS:   SHORT TERM GOALS = LTGs  LONG TERM GOALS: Target date: 03/05/24  Pt will be Ind in a final HEP to maintain achieved LOF  Baseline: started Goal status: INITIAL  2.  Increase hip flexion and abduction strength to 4/5 fro improved function with ADLs Baseline:  Goal status: INITIAL  3.  Improve 5xSTS by MCID of 5" and by MCID of 59ft as indication of improved functional mobility  Baseline: TBA 01/28/24: 19 sec 02/04/24: 392 feet Goal status: ONGOING  4.  Pt's LEFS score will improved to 40% or greater as indication of improved function  Baseline: 16% Goal status: INITIAL    PLAN:  PT FREQUENCY: 2x/week  PT DURATION: 6 weeks  PLANNED INTERVENTIONS: 97164- PT Re-evaluation, 97110-Therapeutic exercises, 97530- Therapeutic activity, 97112- Neuromuscular re-education, 97535- Self Care, 16109- Manual therapy, [phone]- Gait training, [phone]- Aquatic Therapy, [phone]- Electrical stimulation (unattended), Patient/Family education, Balance training, Stair training, Taping, Dry Needling, Joint mobilization, Spinal mobilization, Cryotherapy, and Moist heat  PLAN FOR NEXT SESSION:assess response to HEP; progress therex as indicated; use of modalities, manual therapy; and TPDN as indicated.   Sopheap Basic MS, PT 02/18/24 3:03 PM

## 2024-02-18 ENCOUNTER — Ambulatory Visit: Attending: Internal Medicine

## 2024-02-18 DIAGNOSIS — M79662 Pain in left lower leg: Secondary | ICD-10-CM | POA: Insufficient documentation

## 2024-02-18 DIAGNOSIS — R262 Difficulty in walking, not elsewhere classified: Secondary | ICD-10-CM | POA: Insufficient documentation

## 2024-02-18 DIAGNOSIS — M6281 Muscle weakness (generalized): Secondary | ICD-10-CM | POA: Diagnosis not present

## 2024-02-23 ENCOUNTER — Ambulatory Visit: Admitting: Student

## 2024-02-23 VITALS — BP 114/79 | HR 82 | Temp 98.2°F | Ht 65.0 in | Wt 214.0 lb

## 2024-02-23 DIAGNOSIS — E785 Hyperlipidemia, unspecified: Secondary | ICD-10-CM

## 2024-02-23 DIAGNOSIS — M79605 Pain in left leg: Secondary | ICD-10-CM

## 2024-02-23 DIAGNOSIS — Z7984 Long term (current) use of oral hypoglycemic drugs: Secondary | ICD-10-CM

## 2024-02-23 DIAGNOSIS — E119 Type 2 diabetes mellitus without complications: Secondary | ICD-10-CM | POA: Diagnosis not present

## 2024-02-23 LAB — POCT GLYCOSYLATED HEMOGLOBIN (HGB A1C): Hemoglobin A1C: 6.7 % — AB (ref 4.0–5.6)

## 2024-02-23 LAB — GLUCOSE, CAPILLARY: Glucose-Capillary: 194 mg/dL — ABNORMAL HIGH (ref 70–99)

## 2024-02-23 NOTE — Progress Notes (Signed)
 CC: Follow-up on diabetes  HPI:  [name] is a 68 y.o. female living with a history stated below and presents today for follow-up on type 2 diabetes.  Please see problem based assessment and plan for additional details.  Past Medical History:  Diagnosis Date   Arthritis    Diabetes mellitus without complication (HCC)    Hyperlipidemia    Hypertension    Vitreous floaters of both eyes 08/03/2017    Current Outpatient Medications on File Prior to Visit  Medication Sig Dispense Refill   acetaminophen  (TYLENOL ) 500 MG tablet Take 2 tablets (1,000 mg total) by mouth every 6 (six) hours as needed. 100 tablet 0   Blood Glucose Monitoring Suppl (ACCU-CHEK GUIDE) w/Device KIT See admin instructions.     cyclobenzaprine  (FLEXERIL ) 10 MG tablet Take 1 tablet (10 mg total) by mouth 2 (two) times daily as needed for muscle spasms. *Start by taking 1 tablet by mouth once daily at bedtime. If no issues, can take 1 tablet up to twice a day as needed. 21 tablet 0   diclofenac  Sodium (VOLTAREN ) 1 % GEL Apply 4 g topically 4 (four) times daily. 100 g 0   empagliflozin  (JARDIANCE ) 25 MG TABS tablet Take 1 tablet (25 mg total) by mouth daily before breakfast. 30 tablet 11   glucose blood test strip Use four times daily with insulin as directed. 200 each 5   glucose monitoring kit (FREESTYLE) monitoring kit 1 each by Does not apply route as needed for other. 1 each 1   hydrochlorothiazide  (HYDRODIURIL ) 12.5 MG tablet Take 1 tablet (12.5 mg total) by mouth daily. 90 tablet 3   Lancets (FREESTYLE) lancets Use as instructed 100 each 12   latanoprost (XALATAN) 0.005 % ophthalmic solution Place 1 drop into both eyes at bedtime.     lidocaine  (LIDODERM ) 5 % Place 1 patch onto the skin daily. Remove & Discard patch within 12 hours or as directed by MD (Patient not taking: Reported on 10/31/2023) 30 patch 0   losartan  (COZAAR ) 50 MG tablet Take 1 tablet (50 mg total) by mouth daily. 90 tablet 3    naproxen  (NAPROSYN ) 500 MG tablet Take 1 tablet (500 mg total) by mouth 2 (two) times daily with a meal. 28 tablet 0   rosuvastatin  (CRESTOR ) 20 MG tablet Take 1 tablet by mouth once daily 90 tablet 0   No current facility-administered medications on file prior to visit.   Review of Systems: ROS negative except for what is noted on the assessment and plan.  Vitals:   02/23/24 1046  BP: 114/79  Pulse: 82  Temp: 98.2 F (36.8 C)  TempSrc: Oral  SpO2: 98%  Weight: 214 lb (97.1 kg)  Height: 5\' 5"  (1.651 m)    Last lipids Lab Results  Component Value Date   CHOL 130 10/28/2022   HDL 53 10/28/2022   LDLCALC 63 10/28/2022   TRIG 69 10/28/2022   CHOLHDL 2.5 10/28/2022   Last hemoglobin A1c Lab Results  Component Value Date   HGBA1C 6.7 (A) 02/23/2024   Physical Exam: Constitutional: NAD Cardiovascular: regular rate and rhythm, no m/r/g Pulmonary/Chest: Clear bilateral lungs, no wheezes. MSK: 4/5 strength in the left leg, negative straight leg test.  Normal sensation.  Assessment & Plan:   Patient discussed with Dr. Jarvis Mesa  Type 2 diabetes mellitus (HCC) Well-controlled type 2 diabetes, A1c today 6.7.  Home meds includes Jardiance  25 mg. - Continue with Jardiance  25 mg. - Follow-up in 3 months.  Left leg pain Leg pain has improved with physical therapy.  She is intermittently also using cyclobenzaprine , Tylenol  and Voltaren  gel.  On physical exam, left lower extremity strength improved as compared to prior. Plan: - Continue with physical therapy, Tylenol  and Voltaren  gel.  Hyperlipidemia - Continue with rosuvastatin  20 mg.  Healthcare maintenance screening. She declined pneumococcal vaccine.  She has mammogram and DEXA scan scheduled.  Marni Sins, MD [facility] Internal Medicine, PGY-1  Date 02/23/2024 Time 7:10 PM

## 2024-02-23 NOTE — Patient Instructions (Signed)
 It was a pleasure taking care of you today!    Your diabetes is well-controlled, continue to take Jardiance  25 mg.  2.  I am glad your leg pain has improved with physical therapy.  Continue with the physical therapy, you to use Voltaren  gel and Tylenol  for pain.   I have ordered the following labs for you:   Lab Orders         Glucose, capillary         POC Hbg A1C       Follow up: 3 months   Should you have any questions or concerns please call the internal medicine clinic at [phone].     Marni Sins, MD  [facility]

## 2024-02-23 NOTE — Assessment & Plan Note (Signed)
>>  ASSESSMENT AND PLAN FOR LEFT LEG PAIN WRITTEN ON 02/23/2024  7:10 PM BY CELESTINA CZAR, MD  Leg pain has improved with physical therapy.  She is intermittently also using cyclobenzaprine , Tylenol  and Voltaren  gel.  On physical exam, left lower extremity strength improved as compared to prior. Plan: - Continue with physical therapy, Tylenol  and Voltaren  gel.

## 2024-02-23 NOTE — Assessment & Plan Note (Signed)
 Well-controlled type 2 diabetes, A1c today 6.7.  Home meds includes Jardiance  25 mg. - Continue with Jardiance  25 mg. - Follow-up in 3 months.

## 2024-02-23 NOTE — Assessment & Plan Note (Signed)
 Leg pain has improved with physical therapy.  She is intermittently also using cyclobenzaprine , Tylenol  and Voltaren  gel.  On physical exam, left lower extremity strength improved as compared to prior. Plan: - Continue with physical therapy, Tylenol  and Voltaren  gel.

## 2024-02-24 ENCOUNTER — Ambulatory Visit: Payer: 59

## 2024-02-26 ENCOUNTER — Ambulatory Visit
Admission: RE | Admit: 2024-02-26 | Discharge: 2024-02-26 | Disposition: A | Discharge status: Home / Self Care | Source: Ambulatory Visit | Attending: Student in an Organized Health Care Education/Training Program | Admitting: Student in an Organized Health Care Education/Training Program

## 2024-02-26 DIAGNOSIS — Z0000 Encounter for general adult medical examination without abnormal findings: Secondary | ICD-10-CM

## 2024-02-26 DIAGNOSIS — Z1231 Encounter for screening mammogram for malignant neoplasm of breast: Secondary | ICD-10-CM | POA: Diagnosis not present

## 2024-03-04 ENCOUNTER — Encounter: Admitting: Physical Therapy

## 2024-03-08 ENCOUNTER — Telehealth: Payer: Self-pay | Admitting: *Deleted

## 2024-03-08 NOTE — Telephone Encounter (Signed)
 LVM for patient to return call regarding scheduling her dexa scan (previous appt. Canceled -08-15-2024 @ 11:00 am)

## 2024-03-24 ENCOUNTER — Other Ambulatory Visit: Payer: Self-pay | Admitting: Student

## 2024-03-24 ENCOUNTER — Telehealth: Payer: Self-pay | Admitting: *Deleted

## 2024-03-24 DIAGNOSIS — E1169 Type 2 diabetes mellitus with other specified complication: Secondary | ICD-10-CM

## 2024-03-24 NOTE — Telephone Encounter (Signed)
 Patient canceled dexa scan appt per [facility]  11-/30/2025 @ 11:00 am

## 2024-03-24 NOTE — Telephone Encounter (Signed)
 Medication discontinued

## 2024-03-30 ENCOUNTER — Encounter: Payer: Self-pay | Admitting: Student

## 2024-03-30 ENCOUNTER — Ambulatory Visit: Admitting: Student

## 2024-03-30 ENCOUNTER — Other Ambulatory Visit: Payer: Self-pay

## 2024-03-30 VITALS — BP 112/79 | HR 94 | Temp 97.6°F | Ht 65.0 in | Wt 211.0 lb

## 2024-03-30 DIAGNOSIS — N907 Vulvar cyst: Secondary | ICD-10-CM | POA: Diagnosis not present

## 2024-03-30 NOTE — Patient Instructions (Addendum)
 Thank you so much for coming to the clinic today!   The phone number for the OB/GYN doctor is [phone], ask for Dr. Fredirick.   If you have any questions please feel free to the call the clinic at anytime at [phone]. It was a pleasure seeing you!  Best, Dr. Reet Scharrer

## 2024-03-31 DIAGNOSIS — N907 Vulvar cyst: Secondary | ICD-10-CM | POA: Insufficient documentation

## 2024-03-31 NOTE — Progress Notes (Signed)
 Internal Medicine Clinic Attending  Case discussed with the resident at the time of the visit.  We reviewed the resident's history and exam and pertinent patient test results.  I agree with the assessment, diagnosis, and plan of care documented in the resident's note.    Lesion is not consistent with HSV, syphilis, and she has no evidence of infection. I agree this is likely a cyst, so recommend OB/GYN follow up to determine if it should be drained.

## 2024-03-31 NOTE — Progress Notes (Signed)
 CC: Vaginal Bump  HPI:  [name] is a 68 y.o. female living with a history stated below and presents today for a bump on right labial fold. Please see problem based assessment and plan for additional details.  Past Medical History:  Diagnosis Date   Arthritis    Diabetes mellitus without complication (HCC)    Hyperlipidemia    Hypertension    Vitreous floaters of both eyes 08/03/2017    Current Outpatient Medications on File Prior to Visit  Medication Sig Dispense Refill   acetaminophen  (TYLENOL ) 500 MG tablet Take 2 tablets (1,000 mg total) by mouth every 6 (six) hours as needed. 100 tablet 0   Blood Glucose Monitoring Suppl (ACCU-CHEK GUIDE) w/Device KIT See admin instructions.     cyclobenzaprine  (FLEXERIL ) 10 MG tablet Take 1 tablet (10 mg total) by mouth 2 (two) times daily as needed for muscle spasms. *Start by taking 1 tablet by mouth once daily at bedtime. If no issues, can take 1 tablet up to twice a day as needed. 21 tablet 0   diclofenac  Sodium (VOLTAREN ) 1 % GEL Apply 4 g topically 4 (four) times daily. 100 g 0   empagliflozin  (JARDIANCE ) 25 MG TABS tablet Take 1 tablet (25 mg total) by mouth daily before breakfast. 30 tablet 11   glucose blood test strip Use four times daily with insulin as directed. 200 each 5   glucose monitoring kit (FREESTYLE) monitoring kit 1 each by Does not apply route as needed for other. 1 each 1   hydrochlorothiazide  (HYDRODIURIL ) 12.5 MG tablet Take 1 tablet (12.5 mg total) by mouth daily. 90 tablet 3   Lancets (FREESTYLE) lancets Use as instructed 100 each 12   latanoprost (XALATAN) 0.005 % ophthalmic solution Place 1 drop into both eyes at bedtime.     lidocaine  (LIDODERM ) 5 % Place 1 patch onto the skin daily. Remove & Discard patch within 12 hours or as directed by MD (Patient not taking: Reported on 10/31/2023) 30 patch 0   losartan  (COZAAR ) 50 MG tablet Take 1 tablet (50 mg total) by mouth daily. 90 tablet 3   naproxen   (NAPROSYN ) 500 MG tablet Take 1 tablet (500 mg total) by mouth 2 (two) times daily with a meal. 28 tablet 0   rosuvastatin  (CRESTOR ) 20 MG tablet Take 1 tablet by mouth once daily 90 tablet 0   No current facility-administered medications on file prior to visit.    Family History  Problem Relation Age of Onset   Diabetes Mother    Colon cancer Neg Hx    Colon polyps Neg Hx    Esophageal cancer Neg Hx    Stomach cancer Neg Hx    Rectal cancer Neg Hx    Breast cancer Neg Hx     Social History   Socioeconomic History   Marital status: Single    Spouse name: Not on file   Number of children: Not on file   Years of education: Not on file   Highest education level: Not on file  Occupational History   Not on file  Tobacco Use   Smoking status: Former    Current packs/day: 0.00    Types: Cigarettes    Quit date: 09/16/2008    Years since quitting: 15.5   Smokeless tobacco: Never  Vaping Use   Vaping status: Never Used  Substance and Sexual Activity   Alcohol use: Yes    Alcohol/week: 0.0 standard drinks of alcohol    Comment: Occasionally  wine.   Drug use: No   Sexual activity: Not Currently    Birth control/protection: Surgical  Other Topics Concern   Not on file  Social History Narrative   Not on file   Social Drivers of Health   Financial Resource Strain: Low Risk  (04/24/2022)   Overall Financial Resource Strain (CARDIA)    Difficulty of Paying Living Expenses: Not hard at all  Food Insecurity: No Food Insecurity (10/31/2023)   Hunger Vital Sign    Worried About Running Out of Food in the Last Year: Never true    Ran Out of Food in the Last Year: Never true  Transportation Needs: No Transportation Needs (10/31/2023)   PRAPARE - Administrator, Civil Service (Medical): No    Lack of Transportation (Non-Medical): No  Physical Activity: Insufficiently Active (07/24/2022)   Exercise Vital Sign    Days of Exercise per Week: 3 days    Minutes of Exercise per  Session: 30 min  Stress: No Stress Concern Present (04/24/2022)   [employer] of Occupational Health - Occupational Stress Questionnaire    Feeling of Stress : Not at all  Social Connections: Moderately Integrated (04/24/2022)   Social Connection and Isolation Panel    Frequency of Communication with Friends and Family: Twice a week    Frequency of Social Gatherings with Friends and Family: Twice a week    Attends Religious Services: More than 4 times per year    Active Member of [employer] or Organizations: Yes    Attends Banker Meetings: More than 4 times per year    Marital Status: Never married  Intimate Partner Violence: Not At Risk (04/29/2023)   Humiliation, Afraid, Rape, and Kick questionnaire    Fear of Current or Ex-Partner: No    Emotionally Abused: No    Physically Abused: No    Sexually Abused: No    Review of Systems: ROS negative except for what is noted on the assessment and plan.  Vitals:   03/30/24 0913  BP: 112/79  Pulse: 94  Temp: 97.6 F (36.4 C)  TempSrc: Oral  SpO2: 98%  Weight: 211 lb (95.7 kg)  Height: 5' 5 (1.651 m)    Physical Exam Performed with Chaperone Present: Constitutional: well-appearing female  in no acute distress HENT: normocephalic atraumatic, mucous membranes moist Eyes: conjunctiva non-erythematous Neck: supple Cardiovascular: regular rate and rhythm, no m/r/g Pulmonary/Chest: normal work of breathing on room air, lungs clear to auscultation bilaterally Abdominal: soft, non-tender, non-distended MSK: normal bulk and tone GU: Small white raised area on right labial fold (see photo)    Assessment & Plan:   Epidermoid cyst of labia majora Pt presents for an acute visit for a bump on her right labial fold. She states that she was taking a bath and noticed the bump. She hasn't had any bleeding, or drainage from the area. It is not painful for her. She is not sexually active, and is not having menstrual periods. She  does have a history of benign endocervical polyps diagnosed and excised earlier this year. She denies any trauma to the area, or any systemic symptoms such as fevers or chills. No IV drug use.  On my exam, there is a small, white cyst-like area on her right labial fold that is non-tender to palpation.   Findings seem consistent with an epidermoid cyst, she does follow with OB/GYN and was advised to follow up with them for an incision and drainage of the area.  Plan:  - Number provided for OB/GYN to schedule an appt  Patient discussed with Dr. Machen  Eriona Kinchen, M.D. [facility] Internal Medicine, PGY-3 Pager: [phone] Date 03/31/2024 Time 7:47 AM

## 2024-03-31 NOTE — Assessment & Plan Note (Signed)
 Pt presents for an acute visit for a bump on her right labial fold. She states that she was taking a bath and noticed the bump. She hasn't had any bleeding, or drainage from the area. It is not painful for her. She is not sexually active, and is not having menstrual periods. She does have a history of benign endocervical polyps diagnosed and excised earlier this year. She denies any trauma to the area, or any systemic symptoms such as fevers or chills. No IV drug use.  On my exam, there is a small, white cyst-like area on her right labial fold that is non-tender to palpation.   Findings seem consistent with an epidermoid cyst, she does follow with OB/GYN and was advised to follow up with them for an incision and drainage of the area.   Plan:  - Number provided for OB/GYN to schedule an appt

## 2024-05-07 ENCOUNTER — Ambulatory Visit: Admitting: Obstetrics and Gynecology

## 2024-05-19 ENCOUNTER — Other Ambulatory Visit: Payer: Self-pay | Admitting: *Deleted

## 2024-05-19 ENCOUNTER — Ambulatory Visit (INDEPENDENT_AMBULATORY_CARE_PROVIDER_SITE_OTHER)

## 2024-05-19 VITALS — BP 141/84 | HR 100 | Temp 97.8°F | Ht 65.0 in | Wt 203.0 lb

## 2024-05-19 DIAGNOSIS — D751 Secondary polycythemia: Secondary | ICD-10-CM | POA: Diagnosis not present

## 2024-05-19 DIAGNOSIS — I10 Essential (primary) hypertension: Secondary | ICD-10-CM

## 2024-05-19 DIAGNOSIS — E119 Type 2 diabetes mellitus without complications: Secondary | ICD-10-CM | POA: Diagnosis not present

## 2024-05-19 DIAGNOSIS — E785 Hyperlipidemia, unspecified: Secondary | ICD-10-CM

## 2024-05-19 DIAGNOSIS — F419 Anxiety disorder, unspecified: Secondary | ICD-10-CM

## 2024-05-19 DIAGNOSIS — Z7984 Long term (current) use of oral hypoglycemic drugs: Secondary | ICD-10-CM

## 2024-05-19 DIAGNOSIS — E1169 Type 2 diabetes mellitus with other specified complication: Secondary | ICD-10-CM

## 2024-05-19 LAB — POCT GLYCOSYLATED HEMOGLOBIN (HGB A1C): HbA1c, POC (controlled diabetic range): 6.7 % (ref 0.0–7.0)

## 2024-05-19 LAB — GLUCOSE, CAPILLARY: Glucose-Capillary: 170 mg/dL — ABNORMAL HIGH (ref 70–99)

## 2024-05-19 MED ORDER — LOSARTAN POTASSIUM 50 MG PO TABS
50.0000 mg | ORAL_TABLET | Freq: Every day | ORAL | 3 refills | Status: AC
Start: 2024-05-19 — End: ?

## 2024-05-19 NOTE — Progress Notes (Signed)
 Established Patient Office Visit  Subjective   Patient ID: Colleen Williams, female    DOB: 1956/06/02  Age: 68 y.o. MRN: [id number]  Chief Complaint  Patient presents with   Wants lab work    Colleen Williams is a 68 year old female with a past medical history of type 2 diabetes mellitus, hyperlipidemia, hypertension, and arthritis who presents today for an acute visit to have her blood work drawn.  She presents with her grandson, Colleen Williams, today. She is anxious after conversations with her fellow church community members about the status of her liver and kidney functions, prompting her to come in for routine blood work today.  Grandson also mentions concerns for needing a handicap placard and following up on concerns with the patient's memory.  Please see below for problem based assessment and plan.    Review of Systems  Constitutional: Negative.   HENT: Negative.    Eyes: Negative.   Respiratory: Negative.    Cardiovascular: Negative.   Gastrointestinal: Negative.   Genitourinary: Negative.   Musculoskeletal: Negative.   Neurological: Negative.   Psychiatric/Behavioral: Negative.       Objective:    BP (!) 141/84 (BP Location: Right Arm, Patient Position: Sitting, Cuff Size: Small)   Pulse 100   Temp 97.8 F (36.6 C) (Oral)   Ht 5' 5 (1.651 m)   Wt 203 lb (92.1 kg)   LMP  (LMP Unknown)   SpO2 97%   BMI 33.78 kg/m  Physical Exam Constitutional:      Appearance: Normal appearance.  HENT:     Mouth/Throat:     Mouth: Mucous membranes are moist.  Eyes:     Pupils: Pupils are equal, round, and reactive to light.  Cardiovascular:     Rate and Rhythm: Normal rate and regular rhythm.     Pulses: Normal pulses.     Heart sounds: Normal heart sounds.  Pulmonary:     Effort: Pulmonary effort is normal.     Breath sounds: Normal breath sounds.  Abdominal:     General: Abdomen is flat. Bowel sounds are normal.     Palpations: Abdomen is soft.  Musculoskeletal:         General: Normal range of motion.     Cervical back: Normal range of motion.  Skin:    General: Skin is warm and dry.     Capillary Refill: Capillary refill takes less than 2 seconds.  Neurological:     General: No focal deficit present.     Mental Status: She is alert and oriented to person, place, and time.  Psychiatric:        Mood and Affect: Mood normal.        Behavior: Behavior normal.      Assessment & Plan:  Patient seen with Colleen Williams.  Type 2 Diabetes Mellitus Last A1c was almost 3 months ago, showing 6.7%. Patient has been taking her Jardiance  25mg  daily and has no concerns with side effects. Due to her concern for rechecking her bloodwork, patient is asking about having this drawn today, despite it being just shy of 3 months. Discussed with her it would have to be an out-of-pocket pay, for which she is okay with. --Check  A1c  History of Polycythemia Patient's hemoglobin has been elevated over the last few years. No current follow up with hematology, but patient is not voicing any concerns today. She would like her hemoglobin to be rechecked today. Will continue to monitor and assess for causes.  Consider hypoxia, diuretic use, or polycythemia vera. -CBC   Hyperlipidemia on Statin Therapy Last lipid panel in 10/2022 shows LDL 63 and Total Cholesterol 130. Patient currently on Rosuvastatin  20mg  and has no concerns for side effects. Today's ASCVD score 19.3%. She would like to recheck her lipid panel today, as well as her liver enzymes with a CMP. -Lipid panel -CMP  Anxiety Patient's grandson notes that patient has been more anxious with situations adjacent to her health. The patient feels well overall, but the grandson states she has been discussing her health with church community members. He also notes this anxiety affects her mood greatly. Additionally, he and the patient's daughter are concerned for the patient's memory, and would like her to see a physician to  help assess her cognitive status.  - Appt with Colleen Williams for 9/16    Colleen Brave, DO Internal Medicine Resident, PGY-1 [facility] 1:59 PM 05/19/2024

## 2024-05-19 NOTE — Patient Instructions (Signed)
 Thank you, Colleen Williams for allowing us  to provide your care today. Today we discussed getting your labwork up-to-date.  I have ordered the following labs for you: Lab Orders         Comprehensive metabolic panel with GFR         CBC no Diff         Lipid panel         POC Hbg A1C      I will call if any are abnormal. All of your labs can be accessed through My Chart.   My Chart Access: [email or web address]  Please follow-up in: 3 months for your A1c to be rechecked.    We look forward to seeing you next time. Please call our clinic at [phone] if you have any questions or concerns. The best time to call is Monday-Friday from 9am-4pm, but there is someone available 24/7. If after hours or the weekend, call the main hospital number and ask for the Internal Medicine Resident On-Call. If you need medication refills, please notify your pharmacy one week in advance and they will send us  a request.   Thank you for letting us  take part in your care. Wishing you the best!  Kameron Blethen, DO Internal Medicine Resident, PGY-1 2:00 PM 05/19/2024 [name]

## 2024-05-20 ENCOUNTER — Ambulatory Visit: Payer: Self-pay

## 2024-05-20 LAB — COMPREHENSIVE METABOLIC PANEL WITH GFR
ALT: 27 IU/L (ref 0–32)
AST: 14 IU/L (ref 0–40)
Albumin: 4.3 g/dL (ref 3.9–4.9)
Alkaline Phosphatase: 114 IU/L (ref 44–121)
BUN/Creatinine Ratio: 14 (ref 12–28)
BUN: 10 mg/dL (ref 8–27)
Bilirubin Total: 0.5 mg/dL (ref 0.0–1.2)
CO2: 24 mmol/L (ref 20–29)
Calcium: 10.1 mg/dL (ref 8.7–10.3)
Chloride: 96 mmol/L (ref 96–106)
Creatinine, Ser: 0.7 mg/dL (ref 0.57–1.00)
Globulin, Total: 2.9 g/dL (ref 1.5–4.5)
Glucose: 165 mg/dL — ABNORMAL HIGH (ref 70–99)
Potassium: 3.7 mmol/L (ref 3.5–5.2)
Sodium: 138 mmol/L (ref 134–144)
Total Protein: 7.2 g/dL (ref 6.0–8.5)
eGFR: 95 mL/min/1.73 (ref >59)

## 2024-05-20 LAB — CBC
Hematocrit: 52.5 % — ABNORMAL HIGH (ref 34.0–46.6)
Hemoglobin: 17.2 g/dL — ABNORMAL HIGH (ref 11.1–15.9)
MCH: 28.8 pg (ref 26.6–33.0)
MCHC: 32.8 g/dL (ref 31.5–35.7)
MCV: 88 fL (ref 79–97)
Platelets: 262 x10E3/uL (ref 150–450)
RBC: 5.98 x10E6/uL — ABNORMAL HIGH (ref 3.77–5.28)
RDW: 13.1 % (ref 11.7–15.4)
WBC: 11.1 x10E3/uL — ABNORMAL HIGH (ref 3.4–10.8)

## 2024-05-20 LAB — LIPID PANEL
Chol/HDL Ratio: 2.7 ratio (ref 0.0–4.4)
Cholesterol, Total: 118 mg/dL (ref 100–199)
HDL: 44 mg/dL (ref >39)
LDL Chol Calc (NIH): 58 mg/dL (ref 0–99)
Triglycerides: 83 mg/dL (ref 0–149)
VLDL Cholesterol Cal: 16 mg/dL (ref 5–40)

## 2024-05-20 NOTE — Progress Notes (Signed)
 Reviewed labs. Attempted to call patient twice with no answer and inability to leave voicemail. Will monitor elevated hemoglobin, but patient to remain on statin and hypoglycemic  agents for now.

## 2024-05-21 NOTE — Progress Notes (Signed)
 Internal Medicine Clinic Attending  I was physically present during the key portions of the resident provided service and participated in the medical decision making of patient's management care. I reviewed pertinent patient test results.  The assessment, diagnosis, and plan were formulated together and I agree with the documentation in the resident's note.  Ms. Jalomo received a message from her church clergy that she was destined to develop organ failure.  Her family is concerned about the validity of this claim and an evaluation for geriatrics assessment/cognition is recommended.   Trudy Mliss Dragon, MD

## 2024-05-27 LAB — HM DIABETES EYE EXAM

## 2024-06-01 ENCOUNTER — Encounter: Payer: Self-pay | Admitting: Internal Medicine

## 2024-06-01 ENCOUNTER — Other Ambulatory Visit: Payer: Self-pay

## 2024-06-01 ENCOUNTER — Other Ambulatory Visit: Payer: Self-pay | Admitting: Dietician

## 2024-06-01 ENCOUNTER — Ambulatory Visit (INDEPENDENT_AMBULATORY_CARE_PROVIDER_SITE_OTHER): Admitting: Internal Medicine

## 2024-06-01 VITALS — BP 143/89 | HR 94 | Temp 97.6°F | Ht 65.0 in | Wt 207.0 lb

## 2024-06-01 DIAGNOSIS — D751 Secondary polycythemia: Secondary | ICD-10-CM

## 2024-06-01 DIAGNOSIS — M5442 Lumbago with sciatica, left side: Secondary | ICD-10-CM

## 2024-06-01 DIAGNOSIS — Z9181 History of falling: Secondary | ICD-10-CM

## 2024-06-01 DIAGNOSIS — F430 Acute stress reaction: Secondary | ICD-10-CM

## 2024-06-01 DIAGNOSIS — E119 Type 2 diabetes mellitus without complications: Secondary | ICD-10-CM

## 2024-06-01 DIAGNOSIS — G8929 Other chronic pain: Secondary | ICD-10-CM

## 2024-06-01 HISTORY — DX: Other chronic pain: G89.29

## 2024-06-01 MED ORDER — ACCU-CHEK GUIDE TEST VI STRP: ORAL_STRIP | 12 refills | Status: DC

## 2024-06-01 MED ORDER — ACCU-CHEK GUIDE W/DEVICE KIT: PACK | 0 refills | Status: AC

## 2024-06-01 MED ORDER — ACCU-CHEK SOFTCLIX LANCETS MISC: 12 refills | Status: AC

## 2024-06-01 NOTE — Patient Instructions (Signed)
 Colleen Williams,  I'm so relieved that this worrisome episode has passed and that you are doing so much better!  Thank you for spending time today to talk through how you're doing.    Next time you'll see your regular physician for follow up!  If you decide you'd like to visit with a therapist, we have an excellent one - Ms. Renda Pontes.  Please reach out any time.  Take care and stay well,  Dr. Trudy

## 2024-06-01 NOTE — Assessment & Plan Note (Addendum)
 The emotional stress of recent encounter with a church member (rising clergy) who prophesied Colleen Williams's impending serious illness/cancer/doom has resolved.  Colleen Williams recognizes it as an unfortunate event and has no plan to attend this church or interact with the church member again.  She is very reassured by the recent lab tests done to check her liver, kidney, blood counts and electrolytes and again understands that she doesn't have any symptomatic or physical findings of concern for cancer.  Her mood is now stable with no depression or anxiety.  She has no cognitive concerns at this time.  She has declined offer of therapist but is aware of our excellent clinician on staff should the need arise.  She will return to [facility] for routine care.  PHQ2 =0

## 2024-06-01 NOTE — Telephone Encounter (Signed)
 Nurse reports that patient states she has test strips but does not have a glucometer. Last meter accu chek guide was dispensed in 01/2022, strips for this meter in 05/06/23 and lancets in 01/16/2023. Request meter and supplies to match if doctor agrees that patient needs to check blood sugar at home.

## 2024-06-01 NOTE — Progress Notes (Unsigned)
 Comprehensive Geriatric Assessment - Initial Consultation  PCP: Edgardo Pontiff, DO  Referring Provider:  Dr. Edgardo Reason for Consultation: General assessment; concern about recent emotional response to life event, and whether underlying cognitive or mental health concerns are present.  Colleen Williams was evaluated in [facility] a couple of weeks ago, reporting intense anxiety and requesting a medical evaluation including labs, upon being told by a church clergy that this member had received a prophesy that [name] was going to succumb to a serious illness.  [name] had been feeling very well, but this information then caused her to lose her appetite and weight, and was unable to sleep.  She was beside herself with anxiety.  At this recent visit she had a reassuring exam.  CMP showed normal renal and liver function and normal electrolytes.  Her Hb was noted to be elevated (chronic in recent years) and wasn't felt to be a sign of serious illness (see below).  Since receiving the reassuring news,she has been feeling good, sleeping and eating ok, has even picked up weight after losing some pounds due to emotional distress.  Back to normal!     Interview opened to entertain any other concerns she may have.  Daughter asks if she would benefit from any assistance at home.  L knee makes it difficult to clean the house, and daughter notes that she has had a couple falls.  Walks with cane sometimes, especially when out - afraid she may fall.  She states it's because of her sciatic nerve - daughter states the pain happens very frequently.   Patient Active Problem List   Diagnosis Date Noted   Personal history of fall 06/01/2024   Epidermoid cyst of labia majora 03/31/2024   Status post cervical polyp removal 10/2023 10/30/2023   Acute adjustment disorder 10/30/2023   Morbid obesity (HCC) 04/24/2022   Chronic midline low back pain with left-sided sciatica 03/15/2020   Hyperlipidemia associated with type 2 diabetes  mellitus (HCC) 11/12/2019   Essential hypertension 08/03/2017   Well controlled type 2 diabetes mellitus without evidence of diabetic end organ manifestation (HCC) 01/30/2017   Osteoarthritis of ankle and foot 07/30/2016    Current Outpatient Medications:    acetaminophen  (TYLENOL ) 500 MG tablet, Take 2 tablets (1,000 mg total) by mouth every 6 (six) hours as needed., Disp: 100 tablet, Rfl: 0   Blood Glucose Monitoring Suppl (ACCU-CHEK GUIDE) w/Device KIT, See admin instructions., Disp: , Rfl:    diclofenac  Sodium (VOLTAREN ) 1 % GEL, Apply 4 g topically 4 (four) times daily., Disp: 100 g, Rfl: 0   empagliflozin  (JARDIANCE ) 25 MG TABS tablet, Take 1 tablet (25 mg total) by mouth daily before breakfast., Disp: 30 tablet, Rfl: 11   hydrochlorothiazide  (HYDRODIURIL ) 12.5 MG tablet, Take 1 tablet (12.5 mg total) by mouth daily., Disp: 90 tablet, Rfl: 3   latanoprost (XALATAN) 0.005 % ophthalmic solution, Place 1 drop into both eyes at bedtime., Disp: , Rfl:    losartan  (COZAAR ) 50 MG tablet, Take 1 tablet (50 mg total) by mouth daily., Disp: 90 tablet, Rfl: 3   rosuvastatin  (CRESTOR ) 20 MG tablet, Take 1 tablet by mouth once daily, Disp: 90 tablet, Rfl: 0  Social: Patient has been living alone in her home for many years.  Her daughter/daughter's family lives 3 minutes away, with whom she is very involved.  She has excellent family and social support.  Loves to fish with her brother and sister (sits in chair), most recently a couple weeks  ago.  Caught some brim.  Used to be in a Paediatric nurse, played softball in the day.   Does activities on her phone, keeps busy, visits family, walks.  Admits to isolating herself sometimes, to avoid harmful conversations in larger gatherings (she shares that she takes comments personally). Has a close friend, Colleen Williams, and they spend time together.  Formerly smoked cigarettes.  ADLs/IADLs: Independent in all ADLs and IADLs.  Some confusion with prescriptions - gets texts  from pharmacy, automatically thinks that the text means that something is ready to pick up.  Daughter helps with prescription management.  Ms. Williams remembers to take them as prescribed.  No problems with cooking, which she does for herself.  Likes to [employer].  Known for her deviled eggs.  Drives independently, not getting lost, no confusion, no fender benders.  Doesn't always have money for gas. Uses cell phone independently.  Does her own shopping.  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  She is never without support, though most adults in the family work during the day.  Geriatric ROS Appetite/Weight: Appetite and weight are both improved after the emotionally traumatic recent episode referenced above (weight loss, poor appetite) Elimination: No concerns with bowel or bladder function Mobility & Falls: Uses a cane in R hand when out in community (not in house) due to fear of falling and two falls in the past several months where her left leg gave out (no knee pain).  No concerns with balance.  She often will find herself needing to sit when doing standing/walking tasks, as the LLE bothers her.  Rests for a short while then she can continue.  Went to outpatient PT on [place], quit before completed, she didn't feel like she improved. She does walk around and does her home home exercise program.  Walks around [place] and outside. Sensory: No concerns with vision or hearing Pain: Back and left leg pain which she attributes to sciatica Sleep: No concerns, sleeps well Mood: Currently feels stable, positive.  No intrusive thoughts: PHQ2 =0 Cognition:  No concerns with memory or concentration reported by Ms. Pavlak or noted by daughter Colleen Williams  Objective BP (!) 143/89 (BP Location: Right Arm, Patient Position: Sitting, Cuff Size: Small)   Pulse 94   Temp 97.6 F (36.4 C) (Oral)   Ht 5' 5 (1.651 m)   Wt 207 lb (93.9 kg)   LMP  (LMP Unknown)   SpO2 100%   BMI  34.45 kg/m  Beautifully dressed and groomed, smiling energetic affect, quick in verbal responses, no language difficulty.  No tremor.  Arises from large exam chair by pushing up with arms.  Stable, balanced, smooth, quick gait without use of mobility aid.  Weakness of LLE is not apparent during walking.  Cognitive Assessment: declined; no concerns reported by patient or daughter  Assessment and Plan:  Personal history of fall  Chronic midline low back pain with left-sided sciatica  Well controlled type 2 diabetes mellitus without evidence of diabetic end organ manifestation (HCC) -     Microalbumin / creatinine urine ratio  Emotional crisis, acute reaction to stress Assessment & Plan: The emotional stress of recent encounter with a church member (rising clergy) who prophesied Ms. Yoshino's impending serious illness/cancer/doom has resolved.  Ms. Alanis recognizes it as an unfortunate event and has no plan to attend this church or interact with the church member again.  She is very reassured by the recent lab tests done to check  her liver, kidney, blood counts and electrolytes and again understands that she doesn't have any symptomatic or physical findings of concern for cancer.  Her mood is now stable with no depression or anxiety.  She has no cognitive concerns at this time.  She has declined offer of therapist but is aware of our excellent clinician on staff should the need arise.  She will return to [facility] for routine care.  PHQ2 =0   Polycythemia Assessment & Plan: Incidental on chart review at most recent visit and asymptomatic, her Hg has been gradually elevated since 2023.  Former smoker. No chronic lung or heart disease. No associated medications.  JAK2 could be assessed at a future visit. Ms. Alkins is very anxious about health concerns, to be taken into consideration when this is discussed with her.    No follow-ups on file.

## 2024-06-01 NOTE — Assessment & Plan Note (Addendum)
 Incidental on chart review at most recent visit and asymptomatic, her Hg has been gradually elevated since 2023.  Former smoker. No chronic lung or heart disease. No associated medications.  JAK2 could be assessed at a future visit. Colleen Williams is very anxious about health concerns, to be taken into consideration when this is discussed with her.

## 2024-06-02 LAB — MICROALBUMIN / CREATININE URINE RATIO
Creatinine, Urine: 33.1 mg/dL
Microalb/Creat Ratio: <9 mg/g{creat} (ref 0–29)
Microalbumin, Urine: <3 ug/mL

## 2024-06-07 ENCOUNTER — Ambulatory Visit: Admitting: Dietician

## 2024-06-07 NOTE — Progress Notes (Signed)
 BP Readings from Last 3 Encounters:  06/01/24 (!) 143/89  05/19/24 (!) 141/84  03/30/24 112/79   Wt Readings from Last 10 Encounters:  06/01/24 207 lb (93.9 kg)  05/19/24 203 lb (92.1 kg)  03/30/24 211 lb (95.7 kg)  02/23/24 214 lb (97.1 kg)  01/12/24 207 lb 11.2 oz (94.2 kg)  11/28/23 204 lb (92.5 kg)  11/07/23 203 lb 9.6 oz (92.4 kg)  10/31/23 205 lb 3.2 oz (93.1 kg)  10/30/23 206 lb 6.4 oz (93.6 kg)  10/15/23 202 lb (91.6 kg)   Lab Results  Component Value Date   HGBA1C 6.7 05/19/2024   HGBA1C 6.7 (A) 02/23/2024   HGBA1C 6.8 (A) 10/30/2023   HGBA1C 7.7 (A) 07/30/2023   HGBA1C 6.7 (A) 04/29/2023    Diabetes Self-Management Education  Visit Type: Follow-up (1st after inital in 03/2022)  Appt. Start Time: 1335 Appt. End Time: 1400  06/07/2024  [name], identified by name and date of birth, is a 68 y.o. female with a diagnosis of Diabetes:  Type 2  ASSESSMENT   She is decreasing her weight by making lifestyle changes. Her blood pressure is elevated.    Diabetes Self-Management Education - 06/07/24 1400       Visit Information   Visit Type Follow-up   1st after inital in 03/2022     Health Coping   How would you rate your overall health? Good      Psychosocial Assessment   Patient Belief/Attitude about Diabetes Motivated to manage diabetes    What is the hardest part about your diabetes right now, causing you the most concern, or is the most worrisome to you about your diabetes?   Checking blood sugar    Self-care barriers Lack of material resources;Lack of transportation;Low literacy    Self-management support Family;CDE visits;Doctor's office    Patient Concerns Monitoring;Glycemic Control    Special Needs Simplified materials    Preferred Learning Style Hands on    Learning Readiness Ready    How often do you need to have someone help you when you read instructions, pamphlets, or other written materials from your doctor or pharmacy? 4 - Often       Pre-Education Assessment   Patient understands monitoring blood glucose, interpreting and using results Needs Review      Complications   Last HgB A1C per patient/outside source 6.7 %    How often do you check your blood sugar? 1-2 times/day    Fasting Blood glucose range (mg/dL) 29-870    Postprandial Blood glucose range (mg/dL) 29-870;869-820    Number of hypoglycemic episodes per month 0    Number of hyperglycemic episodes ( >200mg /dL): Occasional    Can you tell when your blood sugar is high? No    Have you had a dilated eye exam in the past 12 months? Yes    Have you had a dental exam in the past 12 months? No   no teeth   Are you checking your feet? Yes    How many days per week are you checking your feet? 7      Dietary Intake   Breakfast deferred   pt interest was self monitoring only today     Activity / Exercise   Activity / Exercise Type --   pt interest was self monitoring only today     Patient Education   Previous Diabetes Education Yes    Monitoring Taught/evaluated SMBG meter.   accu chek guide meter, blood sugar was  129 2 hours after she drank an Ensure     Individualized Goals (developed by patient)   Monitoring  Test my blood glucose as discussed   every morning     Patient Self-Evaluation of Goals - Patient rates self as meeting previously set goals (% of time)   Monitoring 25 - 50% (sometimes)      Post-Education Assessment   Patient understands monitoring blood glucose, interpreting and using results Comprehends key points      Outcomes   Expected Outcomes Demonstrated interest in learning. Expect positive outcomes    Future DMSE 3-4 months    Program Status Not Completed      Subsequent Visit   Since your last visit have you continued or begun to take your medications as prescribed? Yes    Since your last visit have you had your blood pressure checked? Yes    Is your most recent blood pressure lower, unchanged, or higher since your last visit? Higher     Since your last visit have you experienced any weight changes? Loss    Weight Loss (lbs) 19   in the past two years, ~ 10# a year weight loss by lifestyle changes only   Since your last visit, are you checking your blood glucose at least once a day? No   forgot how to use her meter         Individualized Plan for Diabetes Self-Management Training:   Learning Objective:  Patient will have a greater understanding of diabetes self-management. Patient education plan is to attend individual and/or group sessions per assessed needs and concerns.   Plan:   There are no Patient Instructions on file for this visit.  Expected Outcomes:  Demonstrated interest in learning. Expect positive outcomes  Education material provided: Diabetes Resources  If problems or questions, patient to contact team via:  Phone  Future DSME appointment: 3-4 months Arland Hole, RD 06/07/2024 2:50 PM.

## 2024-06-07 NOTE — Patient Instructions (Signed)
 Hi Colleen Williams,  Check your blood sugar and write it in your book.  Please bring your meter and book to your visit in December.  Keep eating more vegetables, small amounts of fruits for snacks and whole grain bread, [place], beans, peanut butter and baked fish.   Try to take a walk for 30 minutes every day.  I will see you on December 23 after you see the doctor.  Jb Dulworth [phone]

## 2024-06-22 ENCOUNTER — Emergency Department (HOSPITAL_COMMUNITY)

## 2024-06-22 ENCOUNTER — Emergency Department (HOSPITAL_COMMUNITY)
Admission: EM | Admit: 2024-06-22 | Discharge: 2024-06-22 | Disposition: A | Discharge status: Home / Self Care | Attending: Emergency Medicine | Admitting: Emergency Medicine

## 2024-06-22 ENCOUNTER — Other Ambulatory Visit: Payer: Self-pay

## 2024-06-22 DIAGNOSIS — K5900 Constipation, unspecified: Secondary | ICD-10-CM | POA: Insufficient documentation

## 2024-06-22 DIAGNOSIS — Z79899 Other long term (current) drug therapy: Secondary | ICD-10-CM | POA: Insufficient documentation

## 2024-06-22 DIAGNOSIS — E876 Hypokalemia: Secondary | ICD-10-CM | POA: Insufficient documentation

## 2024-06-22 DIAGNOSIS — Z7984 Long term (current) use of oral hypoglycemic drugs: Secondary | ICD-10-CM | POA: Insufficient documentation

## 2024-06-22 DIAGNOSIS — D72829 Elevated white blood cell count, unspecified: Secondary | ICD-10-CM | POA: Insufficient documentation

## 2024-06-22 DIAGNOSIS — I10 Essential (primary) hypertension: Secondary | ICD-10-CM | POA: Diagnosis not present

## 2024-06-22 LAB — COMPREHENSIVE METABOLIC PANEL WITH GFR
ALT: 26 U/L (ref 0–44)
AST: 18 U/L (ref 15–41)
Albumin: 3.7 g/dL (ref 3.5–5.0)
Alkaline Phosphatase: 76 U/L (ref 38–126)
Anion gap: 11 (ref 5–15)
BUN: 17 mg/dL (ref 8–23)
CO2: 25 mmol/L (ref 22–32)
Calcium: 9.1 mg/dL (ref 8.9–10.3)
Chloride: 100 mmol/L (ref 98–111)
Creatinine, Ser: 0.84 mg/dL (ref 0.44–1.00)
GFR, Estimated: >60 mL/min (ref >60)
Glucose, Bld: 181 mg/dL — ABNORMAL HIGH (ref 70–99)
Potassium: 3.2 mmol/L — ABNORMAL LOW (ref 3.5–5.1)
Sodium: 136 mmol/L (ref 135–145)
Total Bilirubin: 0.7 mg/dL (ref 0.0–1.2)
Total Protein: 6.6 g/dL (ref 6.5–8.1)

## 2024-06-22 LAB — URINALYSIS, ROUTINE W REFLEX MICROSCOPIC
Bilirubin Urine: NEGATIVE
Glucose, UA: >=500 mg/dL — AB
Hgb urine dipstick: NEGATIVE
Ketones, ur: NEGATIVE mg/dL
Leukocytes,Ua: NEGATIVE
Nitrite: NEGATIVE
Protein, ur: NEGATIVE mg/dL
Specific Gravity, Urine: 1.015 (ref 1.005–1.030)
pH: 6 (ref 5.0–8.0)

## 2024-06-22 LAB — CBC
HCT: 49.2 % — ABNORMAL HIGH (ref 36.0–46.0)
Hemoglobin: 16.2 g/dL — ABNORMAL HIGH (ref 12.0–15.0)
MCH: 28.3 pg (ref 26.0–34.0)
MCHC: 32.9 g/dL (ref 30.0–36.0)
MCV: 85.9 fL (ref 80.0–100.0)
Platelets: 248 K/uL (ref 150–400)
RBC: 5.73 MIL/uL — ABNORMAL HIGH (ref 3.87–5.11)
RDW: 13.5 % (ref 11.5–15.5)
WBC: 11.6 K/uL — ABNORMAL HIGH (ref 4.0–10.5)
nRBC: 0 % (ref 0.0–0.2)

## 2024-06-22 LAB — URINALYSIS, MICROSCOPIC (REFLEX)
Bacteria, UA: NONE SEEN
RBC / HPF: NONE SEEN RBC/hpf (ref 0–5)
WBC, UA: NONE SEEN WBC/hpf (ref 0–5)

## 2024-06-22 LAB — LIPASE, BLOOD: Lipase: 23 U/L (ref 11–51)

## 2024-06-22 MED ORDER — LACTULOSE 10 GM/15ML PO SOLN
20.0000 g | Freq: Once | ORAL | Status: AC
  Administered 2024-06-22: 20 g via ORAL
  Filled 2024-06-22: qty 30

## 2024-06-22 MED ORDER — IOHEXOL 350 MG/ML SOLN
75.0000 mL | Freq: Once | INTRAVENOUS | Status: AC | PRN
  Administered 2024-06-22: 75 mL via INTRAVENOUS

## 2024-06-22 NOTE — ED Provider Notes (Signed)
 [place] [facility] DEPARTMENT AT [facility] Provider Note   CSN: [id number] Arrival date & time: 06/22/24  0203     Patient presents with: Constipation   Colleen Williams is a 68 y.o. female.   68 year old female presents today for concern of constipation.  She states her last bowel movement was 5 days ago.  At baseline she states she gets to have a bowel movement every other day.  She has tried drinking a tablespoon of olive oil, and tried some lemon juice.  Has not tried anything else prior to coming in.  Denies any significant abdominal discomfort, nausea, vomiting.  Still passing gas.  No difficulty urinating.  No other complaints.  The history is provided by the patient. No language interpreter was used.       Prior to Admission medications   Medication Sig Start Date End Date Taking? Authorizing Provider  Accu-Chek Softclix Lancets lancets Check blood sugar up to 7 times a week 06/01/24  Yes Nooruddin, Saad, MD  Bisacodyl (LAXATIVE PO) Take 1 tablet by mouth daily as needed (constipation).   Yes [provider]  Blood Glucose Monitoring Suppl (ACCU-CHEK GUIDE) w/Device KIT Check blood sugar up to 7 times a week 06/01/24  Yes Nooruddin, Saad, MD  empagliflozin  (JARDIANCE ) 25 MG TABS tablet Take 1 tablet (25 mg total) by mouth daily before breakfast. 01/12/24  Yes Arellano Zameza, Priscila, MD  glucose blood (ACCU-CHEK GUIDE TEST) test strip Check blood sugar up to7 times a week 06/01/24  Yes Nooruddin, Saad, MD  hydrochlorothiazide  (HYDRODIURIL ) 12.5 MG tablet Take 1 tablet (12.5 mg total) by mouth daily. 11/24/23  Yes Arellano Zameza, Priscila, MD  ibuprofen  (ADVIL ) 200 MG tablet Take 200 mg by mouth every 6 (six) hours as needed for mild pain (pain score 1-3).   Yes [provider]  latanoprost (XALATAN) 0.005 % ophthalmic solution Place 1 drop into both eyes at bedtime. 06/14/23  Yes [provider]  losartan  (COZAAR ) 50 MG tablet Take 1 tablet  (50 mg total) by mouth daily. 05/19/24  Yes Syeda, Raeeha, DO  OVER THE COUNTER MEDICATION Take 30 mLs by mouth once. Took olive oil 30ml orally one time for constipation   Yes [provider]  rosuvastatin  (CRESTOR ) 20 MG tablet Take 1 tablet by mouth once daily 01/26/24  Yes Arellano Zameza, Priscila, MD    Allergies: Patient has no known allergies.    Review of Systems  Constitutional:  Negative for chills.  Gastrointestinal:  Positive for constipation. Negative for abdominal pain, nausea and vomiting.  Genitourinary:  Negative for dyspareunia.  Neurological:  Negative for light-headedness.  All other systems reviewed and are negative.   Updated Vital Signs BP (!) 129/94 (BP Location: Right Arm)   Pulse 93   Temp 98.2 F (36.8 C) (Oral)   Resp 16   LMP  (LMP Unknown)   SpO2 100%   Physical Exam Vitals and nursing note reviewed.  Constitutional:      General: She is not in acute distress.    Appearance: Normal appearance. She is not ill-appearing.  HENT:     Head: Normocephalic and atraumatic.     Nose: Nose normal.  Eyes:     Conjunctiva/sclera: Conjunctivae normal.  Cardiovascular:     Rate and Rhythm: Normal rate and regular rhythm.  Pulmonary:     Effort: Pulmonary effort is normal. No respiratory distress.  Abdominal:     General: There is no distension.     Palpations: Abdomen  is soft.     Tenderness: There is no abdominal tenderness. There is no guarding.  Musculoskeletal:        General: No deformity. Normal range of motion.     Cervical back: Normal range of motion.  Skin:    Findings: No rash.  Neurological:     Mental Status: She is alert.     (all labs ordered are listed, but only abnormal results are displayed) Labs Reviewed  COMPREHENSIVE METABOLIC PANEL WITH GFR - Abnormal; Notable for the following components:      Result Value   Potassium 3.2 (*)    Glucose, Bld 181 (*)    All other components within normal limits  CBC - Abnormal;  Notable for the following components:   WBC 11.6 (*)    RBC 5.73 (*)    Hemoglobin 16.2 (*)    HCT 49.2 (*)    All other components within normal limits  URINALYSIS, ROUTINE W REFLEX MICROSCOPIC - Abnormal; Notable for the following components:   Glucose, UA >=500 (*)    All other components within normal limits  LIPASE, BLOOD  URINALYSIS, MICROSCOPIC (REFLEX)    EKG: None  Radiology: CT ABDOMEN PELVIS W CONTRAST Result Date: 06/22/2024 EXAM: CT ABDOMEN AND PELVIS WITH CONTRAST 06/22/2024 03:52:23 AM TECHNIQUE: CT of the abdomen and pelvis was performed with the administration of 75 mL of iohexol  (OMNIPAQUE ) 350 MG/ML injection. Multiplanar reformatted images are provided for review. Automated exposure control, iterative reconstruction, and/or weight-based adjustment of the mA/kV was utilized to reduce the radiation dose to as low as reasonably achievable. COMPARISON: CT abdomen and pelvis 11/28/2023 and earlier. CLINICAL HISTORY: 68 year old female with 1-week constipation, unresponsive to laxatives, and uncomfortable feeling of incomplete defecation. FINDINGS: LOWER CHEST: Lower lung volumes compared to March, minor dependent atelectasis of the lung bases. LIVER: Liver enhancement appears stable compared to CT abdomen and pelvis 02/17/2023, with hypodensity areas in the central liver including near the hepatic IVC. Puddling peripheral enhancement of the lesion on series 3 image 16 compatible with benign hemangioma (no follow up imaging recommended). GALLBLADDER AND BILE DUCTS: Negative gallbladder. No biliary ductal dilatation. SPLEEN: No acute abnormality. PANCREAS: No acute abnormality. ADRENAL GLANDS: No acute abnormality. KIDNEYS, URETERS AND BLADDER: No stones in the kidneys or ureters. No hydronephrosis. No perinephric or periureteral stranding. Urinary bladder is unremarkable. GI AND BOWEL: Moderate large bowel retained stool from the right colon to the mid sigmoid colon, similar but  increased compared to March. No large bowel inflammation. Normal appendix on series 3 image 68. 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 No associated inflammation. There is no bowel obstruction. PERITONEUM AND RETROPERITONEUM: No pneumoperitoneum, free fluid, or mesenteric inflammation identified. No pelvis free fluid. VASCULATURE: Calcified aortoiliac atherosclerosis. Portal venous system is patent. Aorta is normal in caliber. LYMPH NODES: No lymphadenopathy. REPRODUCTIVE ORGANS: No acute abnormality. BONES AND SOFT TISSUES: Chronic lumbar degeneration maximal at L4-L5. No acute osseous abnormality. IMPRESSION: 1. Moderate colonic stool burden from the right colon to mid sigmoid, increased from prior. 2. No superimposed acute or inflammatory abnormality. Electronically signed by: Helayne Hurst MD 06/22/2024 04:08 AM EDT RP Workstation: [id number]     Procedures   Medications Ordered in the ED  iohexol  (OMNIPAQUE ) 350 MG/ML injection 75 mL (75 mLs Intravenous Contrast Given 06/22/24 0353)  lactulose (CHRONULAC) 10 GM/15ML solution 20 g (20 g Oral Given 06/22/24 0647)  Medical Decision Making Risk Prescription drug management.   Medical Decision Making / ED Course   This patient presents to the ED for concern of constipation, this involves an extensive number of treatment options, and is a complaint that carries with it a high risk of complications and morbidity.  The differential diagnosis includes bowel obstruction, stool impaction, constipation  MDM: 68 year old female presents today for concern of constipation.  CT scan obtained and shows no acute intra-abdominal process.  Does show moderate to large stool burden.  No stool impaction, or bowel obstruction.  Lactulose given.  No improvement after the lactulose initially. Offered enema.  She was agreeable.  Prior to the enema patient had a  good bowel movement.  she is requesting discharge.  Bowel regimen discussed.  Discharged in stable condition.  Return precaution discussed  . Lab Tests: -I ordered, reviewed, and interpreted labs.   The pertinent results include:   Labs Reviewed  COMPREHENSIVE METABOLIC PANEL WITH GFR - Abnormal; Notable for the following components:      Result Value   Potassium 3.2 (*)    Glucose, Bld 181 (*)    All other components within normal limits  CBC - Abnormal; Notable for the following components:   WBC 11.6 (*)    RBC 5.73 (*)    Hemoglobin 16.2 (*)    HCT 49.2 (*)    All other components within normal limits  URINALYSIS, ROUTINE W REFLEX MICROSCOPIC - Abnormal; Notable for the following components:   Glucose, UA >=500 (*)    All other components within normal limits  LIPASE, BLOOD  URINALYSIS, MICROSCOPIC (REFLEX)      EKG  EKG Interpretation Date/Time:    Ventricular Rate:    PR Interval:    QRS Duration:    QT Interval:    QTC Calculation:   R Axis:      Text Interpretation:           Imaging Studies ordered: I ordered imaging studies including CT abdomen pelvis with contrast I independently visualized and interpreted imaging. I agree with the radiologist interpretation   Medicines ordered and prescription drug management: Meds ordered this encounter  Medications   iohexol  (OMNIPAQUE ) 350 MG/ML injection 75 mL   lactulose (CHRONULAC) 10 GM/15ML solution 20 g    -I have reviewed the patients home medicines and have made adjustments as needed     Reevaluation: After the interventions noted above, I reevaluated the patient and found that they have :resolved  Co morbidities that complicate the patient evaluation  Past Medical History:  Diagnosis Date   Arthritis    Chronic midline low back pain with left-sided sciatica 06/01/2024   Diabetes mellitus without complication (HCC)    Hyperlipidemia    Hypertension    Vitreous floaters of both eyes  08/03/2017      Dispostion: Discharged in stable condition.  Return precaution discussed.  Final diagnoses:  Constipation, unspecified constipation type    ED Discharge Orders     None          Hildegard Loge, PA-C 06/22/24 1043    Emil Share, DO 06/22/24 1050

## 2024-06-22 NOTE — Discharge Instructions (Signed)
 You are able to have a bowel movement in the emergency department.  No concerning findings on your CT scan other than constipation.  Please follow-up with your primary care doctor.  Take MiraLAX daily.  Return for any emergent symptoms.

## 2024-06-22 NOTE — ED Notes (Signed)
 Pt given discharge instructions and verbalized understanding. Opportunity given to ask questions.

## 2024-06-22 NOTE — ED Triage Notes (Signed)
 Pt is coming in for constipation that has been ongoing for around 1 week, she has tried some oral laxatives and olive oil but nothing has worked, she does not say she has any abd pain just the uncomfortable feeling of having to go to the bathroom but cannot. She is otherwise stable at this time with no chest pain or shortness of breath.

## 2024-06-22 NOTE — ED Notes (Signed)
 ED Provider at bedside.

## 2024-06-22 NOTE — ED Notes (Signed)
 Nurse entered room to give Soap suds enema and pt was standing at the door stating that she was not going to do the soap suds because she had just had a large BM. PT  reports feeling relief. Provider made aware.

## 2024-06-24 ENCOUNTER — Telehealth: Payer: Self-pay | Admitting: *Deleted

## 2024-06-24 NOTE — Telephone Encounter (Addendum)
 I called pt to schedule an appt., no answer; left message of office's return call. I also called Shahien, no answer-unable to leave a message, no vm.

## 2024-06-24 NOTE — Telephone Encounter (Signed)
 Copied from CRM [id number]. Topic: Clinical - Medical Advice >> Jun 24, 2024  9:14 AM Marda MATSU wrote: Darden would like a call back, last month pt Pelland was suppose to be seen regarding dementia at the time of the visit provider stated she was in a right state of mind. Darden says that the family would like to revisit the idea of her being tested for dementia.   Please advise.

## 2024-06-24 NOTE — Telephone Encounter (Signed)
 Copied from CRM [id number]. Topic: General - Other >> Jun 24, 2024 11:14 AM Debby BROCKS wrote: Reason for CRM: Patient's grandson Wilton missed a call from RN Gaetana Pan for an appointment schedule. He would like to speak with her directly and will be available the rest of the day

## 2024-06-24 NOTE — Telephone Encounter (Signed)
 Return call - no answer; left message pt needs to schedule an appt.

## 2024-06-27 ENCOUNTER — Ambulatory Visit
Admission: EM | Admit: 2024-06-27 | Discharge: 2024-06-27 | Disposition: A | Discharge status: Home / Self Care | Attending: Internal Medicine | Admitting: Internal Medicine

## 2024-06-27 DIAGNOSIS — K5909 Other constipation: Secondary | ICD-10-CM | POA: Diagnosis not present

## 2024-06-27 MED ORDER — LACTULOSE 10 GM/15ML PO SOLN
10.0000 g | Freq: Two times a day (BID) | ORAL | 0 refills | Status: AC | PRN
Start: 2024-06-27 — End: ?

## 2024-06-27 NOTE — Discharge Instructions (Addendum)
 Persistent constipation despite conservative management.  We can prescribe lactulose to help with bowel movements and if this is still not improving may need to consider going back to the emergency room for possible enema.  If you develop abdominal pain, nausea, vomiting or get to a point where you are unable to pass any bowel or gas then you need to return to the emergency room immediately.  We will treat with the following: Lactulose 15 mL twice daily as needed for constipation.  Once you are having bowel movements then recommend discontinuing. Make sure to stay hydrated by drinking plenty of water.  Can try MiraLAX 1 capful daily as well to help with bowel movements If you develop abdominal pain, nausea, vomiting or get to a point where you are unable to pass any bowel or gas then you need to return to the emergency room immediately. Can return to urgent care as needed

## 2024-06-27 NOTE — ED Provider Notes (Signed)
 [facility]    CSN: [id number] Arrival date & time: 06/27/24  1233      History   Chief Complaint Chief Complaint  Patient presents with   Constipation    HPI Colleen Williams is a 68 y.o. female.   68 year old female presents urgent care with complaints of constipation.  She has been dealing with this for about 2 weeks now.  She went to the emergency room on October 7.  She was given lactulose by mouth and was offered an enema but declined at that time.  The lactulose helped a little bit but not enough to get her moving again.  She is only having tiny little bowel movements at best.  She is very uncomfortable due to this.  She is passing gas.  She denies any nausea, vomiting, abdominal pain, fevers, chills, dysuria, hematuria.   Constipation Associated symptoms: no abdominal pain, no back pain, no dysuria, no fever and no vomiting     Past Medical History:  Diagnosis Date   Arthritis    Chronic midline low back pain with left-sided sciatica 06/01/2024   Diabetes mellitus without complication (HCC)    Hyperlipidemia    Hypertension    Vitreous floaters of both eyes 08/03/2017    Patient Active Problem List   Diagnosis Date Noted   Personal history of fall 06/01/2024   Polycythemia 06/01/2024   Epidermoid cyst of labia majora 03/31/2024   Status post cervical polyp removal 10/2023 10/30/2023   Emotional crisis, acute reaction to stress 10/30/2023   Morbid obesity (HCC) 04/24/2022   Chronic midline low back pain with left-sided sciatica 03/15/2020   Hyperlipidemia associated with type 2 diabetes mellitus (HCC) 11/12/2019   Essential hypertension 08/03/2017   Well controlled type 2 diabetes mellitus without evidence of diabetic end organ manifestation (HCC) 01/30/2017   Osteoarthritis of ankle and foot 07/30/2016    Past Surgical History:  Procedure Laterality Date   COLONOSCOPY     TUBAL LIGATION      OB History     Gravida  2   Para  2   Term   2   Preterm  0   AB  0   Living  2      SAB  0   IAB  0   Ectopic  0   Multiple  0   Live Births  2            Home Medications    Prior to Admission medications   Medication Sig Start Date End Date Taking? Authorizing Provider  lactulose (CHRONULAC) 10 GM/15ML solution Take 15 mLs (10 g total) by mouth 2 (two) times daily as needed for moderate constipation. 06/27/24  Yes Jonet Mathies A, PA-C  Accu-Chek Softclix Lancets lancets Check blood sugar up to 7 times a week 06/01/24   Nooruddin, Saad, MD  Bisacodyl (LAXATIVE PO) Take 1 tablet by mouth daily as needed (constipation).    [provider]  Blood Glucose Monitoring Suppl (ACCU-CHEK GUIDE) w/Device KIT Check blood sugar up to 7 times a week 06/01/24   Nooruddin, Saad, MD  empagliflozin  (JARDIANCE ) 25 MG TABS tablet Take 1 tablet (25 mg total) by mouth daily before breakfast. 01/12/24   Arellano Zameza, Priscila, MD  glucose blood (ACCU-CHEK GUIDE TEST) test strip Check blood sugar up to7 times a week 06/01/24   Nooruddin, Saad, MD  hydrochlorothiazide  (HYDRODIURIL ) 12.5 MG tablet Take 1 tablet (12.5 mg total) by mouth daily. 11/24/23   Arellano Zameza,  Priscila, MD  ibuprofen  (ADVIL ) 200 MG tablet Take 200 mg by mouth every 6 (six) hours as needed for mild pain (pain score 1-3).    [provider]  latanoprost (XALATAN) 0.005 % ophthalmic solution Place 1 drop into both eyes at bedtime. 06/14/23   [provider]  losartan  (COZAAR ) 50 MG tablet Take 1 tablet (50 mg total) by mouth daily. 05/19/24   Edgardo Pontiff, DO  OVER THE COUNTER MEDICATION Take 30 mLs by mouth once. Took olive oil 30ml orally one time for constipation    [provider]  rosuvastatin  (CRESTOR ) 20 MG tablet Take 1 tablet by mouth once daily 01/26/24   Arellano Zameza, Priscila, MD    Family History Family History  Problem Relation Age of Onset   Diabetes Mother    Colon cancer Neg Hx    Colon polyps Neg Hx     Esophageal cancer Neg Hx    Stomach cancer Neg Hx    Rectal cancer Neg Hx    Breast cancer Neg Hx     Social History Social History   Tobacco Use   Smoking status: Former    Current packs/day: 0.00    Types: Cigarettes    Quit date: 09/16/2008    Years since quitting: 15.7   Smokeless tobacco: Never  Vaping Use   Vaping status: Never Used  Substance Use Topics   Alcohol use: Yes    Alcohol/week: 0.0 standard drinks of alcohol    Comment: Occasionally wine.   Drug use: No     Allergies   Patient has no known allergies.   Review of Systems Review of Systems  Constitutional:  Negative for chills and fever.  HENT:  Negative for ear pain and sore throat.   Eyes:  Negative for pain and visual disturbance.  Respiratory:  Negative for cough and shortness of breath.   Cardiovascular:  Negative for chest pain and palpitations.  Gastrointestinal:  Positive for constipation. Negative for abdominal pain and vomiting.  Genitourinary:  Negative for dysuria and hematuria.  Musculoskeletal:  Negative for arthralgias and back pain.  Skin:  Negative for color change and rash.  Neurological:  Negative for seizures and syncope.  All other systems reviewed and are negative.    Physical Exam Triage Vital Signs ED Triage Vitals  Encounter Vitals Group     BP 06/27/24 1400 108/77     Girls Systolic BP Percentile --      Girls Diastolic BP Percentile --      Boys Systolic BP Percentile --      Boys Diastolic BP Percentile --      Pulse Rate 06/27/24 1400 (!) 102     Resp 06/27/24 1400 18     Temp 06/27/24 1400 97.6 F (36.4 C)     Temp Source 06/27/24 1400 Oral     SpO2 06/27/24 1400 96 %     Weight 06/27/24 1359 207 lb 0.2 oz (93.9 kg)     Height 06/27/24 1359 5' 6 (1.676 m)     Head Circumference --      Peak Flow --      Pain Score 06/27/24 1359 0     Pain Loc --      Pain Education --      Exclude from Growth Chart --    No data found.  Updated Vital Signs BP 108/77  (BP Location: Right Arm)   Pulse (!) 102   Temp 97.6 F (36.4 C) (Oral)  Resp 18   Ht 5' 6 (1.676 m)   Wt 207 lb 0.2 oz (93.9 kg)   LMP  (LMP Unknown)   SpO2 96%   BMI 33.41 kg/m   Visual Acuity Right Eye Distance:   Left Eye Distance:   Bilateral Distance:    Right Eye Near:   Left Eye Near:    Bilateral Near:     Physical Exam Vitals and nursing note reviewed.  Constitutional:      General: She is not in acute distress.    Appearance: She is well-developed.  HENT:     Head: Normocephalic and atraumatic.  Eyes:     Conjunctiva/sclera: Conjunctivae normal.  Cardiovascular:     Rate and Rhythm: Normal rate and regular rhythm.     Heart sounds: No murmur heard. Pulmonary:     Effort: Pulmonary effort is normal. No respiratory distress.     Breath sounds: Normal breath sounds.  Abdominal:     General: Bowel sounds are normal.     Palpations: Abdomen is soft. There is no mass.     Tenderness: There is no abdominal tenderness. There is no guarding or rebound.  Musculoskeletal:        General: No swelling.     Cervical back: Neck supple.  Skin:    General: Skin is warm and dry.     Capillary Refill: Capillary refill takes less than 2 seconds.  Neurological:     General: No focal deficit present.     Mental Status: She is alert.  Psychiatric:        Mood and Affect: Mood normal.      UC Treatments / Results  Labs (all labs ordered are listed, but only abnormal results are displayed) Labs Reviewed - No data to display  EKG   Radiology No results found.  Procedures Procedures (including critical care time)  Medications Ordered in UC Medications - No data to display  Initial Impression / Assessment and Plan / UC Course  I have reviewed the triage vital signs and the nursing notes.  Pertinent labs & imaging results that were available during my care of the patient were reviewed by me and considered in my medical decision making (see chart for  details).     Other constipation   Persistent constipation despite conservative management.  We can prescribe lactulose to help with bowel movements and if this is still not improving may need to consider going back to the emergency room for possible enema.  If you develop abdominal pain, nausea, vomiting or get to a point where you are unable to pass any bowel or gas then you need to return to the emergency room immediately.  We will treat with the following: Lactulose 15 mL twice daily as needed for constipation.  Once you are having bowel movements then recommend discontinuing. Make sure to stay hydrated by drinking plenty of water.  Can try MiraLAX 1 capful daily as well to help with bowel movements If you develop abdominal pain, nausea, vomiting or get to a point where you are unable to pass any bowel or gas then you need to return to the emergency room immediately. Can return to urgent care as needed  Final Clinical Impressions(s) / UC Diagnoses   Final diagnoses:  Other constipation     Discharge Instructions      Persistent constipation despite conservative management.  We can prescribe lactulose to help with bowel movements and if this is still not improving may need  to consider going back to the emergency room for possible enema.  If you develop abdominal pain, nausea, vomiting or get to a point where you are unable to pass any bowel or gas then you need to return to the emergency room immediately.  We will treat with the following: Lactulose 15 mL twice daily as needed for constipation.  Once you are having bowel movements then recommend discontinuing. Make sure to stay hydrated by drinking plenty of water.  Can try MiraLAX 1 capful daily as well to help with bowel movements If you develop abdominal pain, nausea, vomiting or get to a point where you are unable to pass any bowel or gas then you need to return to the emergency room immediately. Can return to urgent care as  needed    ED Prescriptions     Medication Sig Dispense Auth. Provider   lactulose (CHRONULAC) 10 GM/15ML solution Take 15 mLs (10 g total) by mouth 2 (two) times daily as needed for moderate constipation. 236 mL Teresa Almarie LABOR, [place]      PDMP not reviewed this encounter.   Teresa Almarie LABOR, PA-C 06/27/24 1429

## 2024-06-27 NOTE — ED Triage Notes (Signed)
 Patient reports feeling constipated. They went to the Emergency Department [facility] ED), where an enema was suggested, but they declined, thinking it would resolve on its own. However, the issue persists. Their last bowel movement was about a week ago and was very gassy. When they do have a bowel movement, it is in small, soft amounts, not hard. They experience some stomach discomfort, but no pain, nausea, or vomiting.

## 2024-06-29 ENCOUNTER — Other Ambulatory Visit: Payer: Self-pay

## 2024-06-29 DIAGNOSIS — E1169 Type 2 diabetes mellitus with other specified complication: Secondary | ICD-10-CM

## 2024-06-29 MED ORDER — ROSUVASTATIN CALCIUM 20 MG PO TABS: 20.0000 mg | ORAL_TABLET | Freq: Every day | ORAL | 0 refills | Status: DC

## 2024-06-29 NOTE — Telephone Encounter (Signed)
 Medication sent to pharmacy

## 2024-07-01 ENCOUNTER — Ambulatory Visit (INDEPENDENT_AMBULATORY_CARE_PROVIDER_SITE_OTHER): Admitting: Student

## 2024-07-01 ENCOUNTER — Encounter: Payer: Self-pay | Admitting: Student

## 2024-07-01 VITALS — BP 119/81 | HR 102 | Temp 97.5°F | Ht 66.0 in | Wt 201.3 lb

## 2024-07-01 DIAGNOSIS — Z79899 Other long term (current) drug therapy: Secondary | ICD-10-CM | POA: Diagnosis not present

## 2024-07-01 DIAGNOSIS — Z820 Family history of epilepsy and other diseases of the nervous system: Secondary | ICD-10-CM

## 2024-07-01 DIAGNOSIS — I10 Essential (primary) hypertension: Secondary | ICD-10-CM

## 2024-07-01 DIAGNOSIS — R4189 Other symptoms and signs involving cognitive functions and awareness: Secondary | ICD-10-CM

## 2024-07-01 DIAGNOSIS — Z87891 Personal history of nicotine dependence: Secondary | ICD-10-CM | POA: Diagnosis not present

## 2024-07-01 NOTE — Patient Instructions (Addendum)
 Thank you, Ms.Aamna L Rease for allowing us  to provide your care today. Today we discussed:  -Thank you for coming in today.  -Please let our office know if you have any concerns or questions.  -I will discuss information with your daughter and grandson.  -Blood work today, I will call with results. -I have ordered a MRI brain to further evaluate.   Follow up: 1-2 months    Should you have any questions or concerns please call the internal medicine clinic at [phone].    Mayli Covington, D.O. [facility]
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
## 2024-07-02 ENCOUNTER — Ambulatory Visit: Payer: Self-pay | Admitting: Student

## 2024-07-02 LAB — VITAMIN B12: Vitamin B-12: 556 pg/mL (ref 232–1245)

## 2024-07-02 LAB — RPR: RPR Ser Ql: NONREACTIVE

## 2024-07-02 LAB — TSH: TSH: 1.07 u[IU]/mL (ref 0.450–4.500)

## 2024-07-03 NOTE — Assessment & Plan Note (Addendum)
 Status: At goal. BP today 119/81. Prescribed losartan  25mg  and hydrochlorothiazide  12.5mg  daily. Had recent CMP that was stable, but noted K 3.2.     Plan -Continue: losartan  and hydrochlorothiazide   -Repeat BMP at next OV

## 2024-07-05 ENCOUNTER — Telehealth: Payer: Self-pay | Admitting: *Deleted

## 2024-07-05 NOTE — Progress Notes (Signed)
 Internal Medicine Clinic Attending  I was physically present during the key portions of the resident provided service and participated in the medical decision making of patient's management care. I reviewed pertinent patient test results.  The assessment, diagnosis, and plan were formulated together and I agree with the documentation in the resident's note.  Francesco Elsie NOVAK, MD

## 2024-07-05 NOTE — Telephone Encounter (Signed)
 Copied from CRM #[id number]. Topic: Clinical - Lab/Test Results >> Jul 05, 2024 11:29 AM DeAngela L wrote: Reason for CRM: patient returning missed call from Dr Elicia about Lab results   Pt call back number  [phone]   no notes for e2c2 to relay message to patient

## 2024-07-05 NOTE — Addendum Note (Signed)
 Addended by: ELICIA SHARPER on: 07/05/2024 05:21 PM   Modules accepted: Orders

## 2024-07-09 ENCOUNTER — Encounter (HOSPITAL_COMMUNITY): Payer: Self-pay

## 2024-07-09 ENCOUNTER — Emergency Department (HOSPITAL_COMMUNITY)
Admission: EM | Admit: 2024-07-09 | Discharge: 2024-07-10 | Disposition: A | Discharge status: Home / Self Care | Attending: Emergency Medicine | Admitting: Emergency Medicine

## 2024-07-09 ENCOUNTER — Emergency Department (HOSPITAL_COMMUNITY)

## 2024-07-09 ENCOUNTER — Other Ambulatory Visit: Payer: Self-pay

## 2024-07-09 DIAGNOSIS — Z79899 Other long term (current) drug therapy: Secondary | ICD-10-CM | POA: Insufficient documentation

## 2024-07-09 DIAGNOSIS — K5900 Constipation, unspecified: Secondary | ICD-10-CM | POA: Insufficient documentation

## 2024-07-09 DIAGNOSIS — R1031 Right lower quadrant pain: Secondary | ICD-10-CM | POA: Diagnosis present

## 2024-07-09 DIAGNOSIS — I10 Essential (primary) hypertension: Secondary | ICD-10-CM | POA: Insufficient documentation

## 2024-07-09 DIAGNOSIS — E119 Type 2 diabetes mellitus without complications: Secondary | ICD-10-CM | POA: Diagnosis not present

## 2024-07-09 DIAGNOSIS — D751 Secondary polycythemia: Secondary | ICD-10-CM | POA: Insufficient documentation

## 2024-07-09 DIAGNOSIS — Z7984 Long term (current) use of oral hypoglycemic drugs: Secondary | ICD-10-CM | POA: Insufficient documentation

## 2024-07-09 DIAGNOSIS — R9389 Abnormal findings on diagnostic imaging of other specified body structures: Secondary | ICD-10-CM | POA: Insufficient documentation

## 2024-07-09 LAB — COMPREHENSIVE METABOLIC PANEL WITH GFR
ALT: 37 U/L (ref 0–44)
AST: 25 U/L (ref 15–41)
Albumin: 3.8 g/dL (ref 3.5–5.0)
Alkaline Phosphatase: 79 U/L (ref 38–126)
Anion gap: 12 (ref 5–15)
BUN: 13 mg/dL (ref 8–23)
CO2: 23 mmol/L (ref 22–32)
Calcium: 9.5 mg/dL (ref 8.9–10.3)
Chloride: 101 mmol/L (ref 98–111)
Creatinine, Ser: 0.62 mg/dL (ref 0.44–1.00)
GFR, Estimated: >60 mL/min (ref >60)
Glucose, Bld: 116 mg/dL — ABNORMAL HIGH (ref 70–99)
Potassium: 3.8 mmol/L (ref 3.5–5.1)
Sodium: 136 mmol/L (ref 135–145)
Total Bilirubin: 0.9 mg/dL (ref 0.0–1.2)
Total Protein: 6.9 g/dL (ref 6.5–8.1)

## 2024-07-09 LAB — URINALYSIS, ROUTINE W REFLEX MICROSCOPIC
Bilirubin Urine: NEGATIVE
Glucose, UA: >=500 mg/dL — AB
Hgb urine dipstick: NEGATIVE
Ketones, ur: NEGATIVE mg/dL
Leukocytes,Ua: NEGATIVE
Nitrite: NEGATIVE
Protein, ur: NEGATIVE mg/dL
Specific Gravity, Urine: 1.015 (ref 1.005–1.030)
pH: 5 (ref 5.0–8.0)

## 2024-07-09 LAB — CBC WITH DIFFERENTIAL/PLATELET
Abs Immature Granulocytes: 0.03 K/uL (ref 0.00–0.07)
Basophils Absolute: 0.1 K/uL (ref 0.0–0.1)
Basophils Relative: 1 %
Eosinophils Absolute: 0.1 K/uL (ref 0.0–0.5)
Eosinophils Relative: 1 %
HCT: 49.3 % — ABNORMAL HIGH (ref 36.0–46.0)
Hemoglobin: 16.2 g/dL — ABNORMAL HIGH (ref 12.0–15.0)
Immature Granulocytes: 0 %
Lymphocytes Relative: 52 %
Lymphs Abs: 6.6 K/uL — ABNORMAL HIGH (ref 0.7–4.0)
MCH: 28.3 pg (ref 26.0–34.0)
MCHC: 32.9 g/dL (ref 30.0–36.0)
MCV: 86 fL (ref 80.0–100.0)
Monocytes Absolute: 0.7 K/uL (ref 0.1–1.0)
Monocytes Relative: 6 %
Neutro Abs: 4.9 K/uL (ref 1.7–7.7)
Neutrophils Relative %: 40 %
Platelets: 258 K/uL (ref 150–400)
RBC: 5.73 MIL/uL — ABNORMAL HIGH (ref 3.87–5.11)
RDW: 14 % (ref 11.5–15.5)
WBC: 12.4 K/uL — ABNORMAL HIGH (ref 4.0–10.5)
nRBC: 0 % (ref 0.0–0.2)

## 2024-07-09 LAB — LIPASE, BLOOD: Lipase: 25 U/L (ref 11–51)

## 2024-07-09 MED ORDER — IOHEXOL 350 MG/ML SOLN
75.0000 mL | Freq: Once | INTRAVENOUS | Status: AC | PRN
Start: 2024-07-09 — End: 2024-07-09
  Administered 2024-07-09: 75 mL via INTRAVENOUS

## 2024-07-09 NOTE — ED Provider Triage Note (Signed)
 Emergency Medicine Provider Triage Evaluation Note  Colleen Williams , a 68 y.o. female  was evaluated in triage.  Pt complains of right flank pain as well as right lower quadrant abdominal versus pelvic pain.  Patient denies fever, chills, chest pain, shortness of breath, nausea, vomiting, or urinary symptoms. Has no previous history of kidney stones.  Patient states that pain has been present for approximately 2 days and is episodic.  Patient does appreciate the pain is worse with prolonged sitting.  However denies pain being worse with movement.  Review of Systems  Positive: Right flank pain, right lower quadrant pelvic pain is abdominal pain Negative: EVAR, chills, nausea, vomiting, chest pain, shortness of breath, urinary symptoms  Physical Exam  BP (!) 141/88 (BP Location: Right Arm)   Pulse 94   Temp 98.4 F (36.9 C)   Resp 15   Ht 5' 6 (1.676 m)   Wt 92.5 kg   LMP  (LMP Unknown)   SpO2 99%   BMI 32.93 kg/m  Gen:   Awake, no distress   Resp:  Normal effort  MSK:   Moves extremities without difficulty  Other:  Tenderness with palpation of right pelvic area, no CVA tenderness, abdomen soft  Medical Decision Making  Medically screening exam initiated at 5:34 PM.  Appropriate orders placed.  Colleen Williams was informed that the remainder of the evaluation will be completed by another provider, this initial triage assessment does not replace that evaluation, and the importance of remaining in the ED until their evaluation is complete.  Orders: CBC, CMP, lipase, UA, CT abdomen pelvis with contrast   Colleen Terrall FALCON, PA-C 07/09/24 1736

## 2024-07-09 NOTE — ED Triage Notes (Signed)
 First Nurse Note: Pt presents with 2 days of intermittent R flank pain. Pain is worse with prolonged sitting. She denies N/V/D, or urinary symptoms.

## 2024-07-10 MED ORDER — BISACODYL 5 MG PO TBEC
5.0000 mg | DELAYED_RELEASE_TABLET | Freq: Once | ORAL | Status: AC
  Administered 2024-07-10: 5 mg via ORAL
  Filled 2024-07-10: qty 1

## 2024-07-10 MED ORDER — NAPROXEN 375 MG PO TABS
375.0000 mg | ORAL_TABLET | Freq: Two times a day (BID) | ORAL | 0 refills | Status: AC
Start: 2024-07-10 — End: ?

## 2024-07-10 MED ORDER — TRAMADOL HCL 50 MG PO TABS: 50.0000 mg | ORAL_TABLET | Freq: Four times a day (QID) | ORAL | 0 refills | Status: DC | PRN

## 2024-07-10 NOTE — ED Provider Notes (Signed)
 [place] EMERGENCY DEPARTMENT AT [facility] Provider Note   CSN: [id number] Arrival date & time: 07/09/24  1604     Patient presents with: Flank Pain   Colleen Williams is a 68 y.o. female.   The history is provided by the patient.  Flank Pain   She has history of hypertension, diabetes, hyperlipidemia, chronic back pain and comes in complaining of pain in the right lower abdomen radiating to the right flank for the last 3 days.  Pain is constant.  Nothing makes it better, nothing makes it worse.  She denies nausea or vomiting.  She denies fever or chills.  She denies dysuria or urinary urgency or frequency or tenesmus or dysuria.  She denies any vaginal discharge or bleeding.  She does endorse constipation and states it has been approximately 4 days since her last bowel movement, and on that occasion the stool was very hard.  She has been taking acetaminophen  which only gives slight relief of pain.    Prior to Admission medications   Medication Sig Start Date End Date Taking? Authorizing Provider  Accu-Chek Softclix Lancets lancets Check blood sugar up to 7 times a week 06/01/24   Nooruddin, Saad, MD  Bisacodyl (LAXATIVE PO) Take 1 tablet by mouth daily as needed (constipation).    [provider]  Blood Glucose Monitoring Suppl (ACCU-CHEK GUIDE) w/Device KIT Check blood sugar up to 7 times a week 06/01/24   Nooruddin, Saad, MD  empagliflozin  (JARDIANCE ) 25 MG TABS tablet Take 1 tablet (25 mg total) by mouth daily before breakfast. 01/12/24   Arellano Zameza, Priscila, MD  glucose blood (ACCU-CHEK GUIDE TEST) test strip Check blood sugar up to7 times a week 06/01/24   Nooruddin, Saad, MD  hydrochlorothiazide  (HYDRODIURIL ) 12.5 MG tablet Take 1 tablet (12.5 mg total) by mouth daily. 11/24/23   Arellano Zameza, Priscila, MD  ibuprofen  (ADVIL ) 200 MG tablet Take 200 mg by mouth every 6 (six) hours as needed for mild pain (pain score 1-3).    [provider]   lactulose (CHRONULAC) 10 GM/15ML solution Take 15 mLs (10 g total) by mouth 2 (two) times daily as needed for moderate constipation. 06/27/24   White, Elizabeth A, PA-C  latanoprost (XALATAN) 0.005 % ophthalmic solution Place 1 drop into both eyes at bedtime. 06/14/23   [provider]  losartan  (COZAAR ) 50 MG tablet Take 1 tablet (50 mg total) by mouth daily. 05/19/24   Edgardo Pontiff, DO  OVER THE COUNTER MEDICATION Take 30 mLs by mouth once. Took olive oil 30ml orally one time for constipation    [provider]  rosuvastatin  (CRESTOR ) 20 MG tablet Take 1 tablet (20 mg total) by mouth daily. 06/29/24   Syeda, Raeeha, DO    Allergies: Patient has no known allergies.    Review of Systems  Genitourinary:  Positive for flank pain.  All other systems reviewed and are negative.   Updated Vital Signs BP 109/73 (BP Location: Right Arm)   Pulse (!) 102   Temp 98.2 F (36.8 C)   Resp 18   Ht 5' 6 (1.676 m)   Wt 92.5 kg   LMP  (LMP Unknown)   SpO2 95%   BMI 32.93 kg/m   Physical Exam Vitals and nursing note reviewed.   68 year old female, resting comfortably and in no acute distress. Vital signs are significant for borderline elevated heart rate. Oxygen saturation is 95%, which is normal. Head is normocephalic and atraumatic. PERRLA, EOMI.  Back is nontender and there is no CVA tenderness. Lungs are clear without rales, wheezes, or rhonchi. Chest is nontender. Heart has regular rate and rhythm without murmur. Abdomen is soft, flat, with mild to moderate right lower quadrant tenderness.  There is no rebound or guarding. Extremities have no cyanosis or edema, full range of motion is present. Skin is warm and dry without rash. Neurologic: Mental status is normal, cranial nerves are intact, moves all extremities equally.  (all labs ordered are listed, but only abnormal results are displayed) Labs Reviewed  COMPREHENSIVE METABOLIC PANEL WITH GFR - Abnormal; Notable for  the following components:      Result Value   Glucose, Bld 116 (*)    All other components within normal limits  CBC WITH DIFFERENTIAL/PLATELET - Abnormal; Notable for the following components:   WBC 12.4 (*)    RBC 5.73 (*)    Hemoglobin 16.2 (*)    HCT 49.3 (*)    Lymphs Abs 6.6 (*)    All other components within normal limits  URINALYSIS, ROUTINE W REFLEX MICROSCOPIC - Abnormal; Notable for the following components:   Glucose, UA >=500 (*)    Bacteria, UA RARE (*)    All other components within normal limits  LIPASE, BLOOD    EKG: None  Radiology: CT ABDOMEN PELVIS W CONTRAST Result Date: 07/09/2024 EXAM: CT ABDOMEN AND PELVIS WITH CONTRAST 07/09/2024 07:54:23 PM TECHNIQUE: CT of the abdomen and pelvis was performed with the administration of 75 mL of iohexol  (OMNIPAQUE ) 350 MG/ML injection. Multiplanar reformatted images are provided for review. Automated exposure control, iterative reconstruction, and/or weight-based adjustment of the mA/kV was utilized to reduce the radiation dose to as low as reasonably achievable. COMPARISON: 06/22/2024 CLINICAL HISTORY: RLQ abdominal vs. pelvic pain, Right flank pain. Chief complaints; Flank Pain. 75ml Omni350. FINDINGS: LOWER CHEST: No acute abnormality. 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 GALLBLADDER AND BILE DUCTS: Gallbladder is unremarkable. No biliary ductal dilatation. SPLEEN: No acute abnormality. PANCREAS: No acute abnormality. ADRENAL GLANDS: No acute abnormality. KIDNEYS, URETERS AND BLADDER: No stones in the kidneys or ureters. No hydronephrosis. No perinephric or periureteral stranding. Urinary bladder is unremarkable. GI AND BOWEL: Stomach demonstrates no acute abnormality. There is no bowel obstruction. Normal appendix (image 59). PERITONEUM AND RETROPERITONEUM: No ascites. No free air. VASCULATURE: Atherosclerotic calcifications of the  abdominal aorta and branch vessels, although patent. LYMPH NODES: No lymphadenopathy. REPRODUCTIVE ORGANS: The uterus is notable for mild endometrial thickening, measuring 12 mm. BONES AND SOFT TISSUES: Mild degenerative changes of the lower lumbar spine. Tiny fat-containing bilateral inguinal hernias. No acute osseous abnormality. No focal soft tissue abnormality. IMPRESSION: 1. No acute findings in the abdomen or pelvis. 2. Mild endometrial thickening. Consider GYN consultation and pelvic ultrasound for further evaluation, as clinically warranted. Electronically signed by: Pinkie Pebbles MD 07/09/2024 08:05 PM EDT RP Workstation: [id number]     Procedures   Medications Ordered in the ED  iohexol  (OMNIPAQUE ) 350 MG/ML injection 75 mL (75 mLs Intravenous Contrast Given 07/09/24 1954)  bisacodyl (DULCOLAX) EC tablet 5 mg (5 mg Oral Given 07/10/24 0429)                                    Medical Decision Making  Right lower quadrant pain and tenderness.  This a presentation with wide range of  treatment options and carries with it a high risk of morbidity and complications.  Differential diagnosis includes, but is not limited to, urinary tract infection, pyelonephritis, renal colic from ureterolithiasis, appendicitis, diverticulitis.  I reviewed her laboratory tests, and my interpretation is normal comprehensive metabolic panel except for minimally elevated glucose, normal lipase, mild leukocytosis but with normal differential, mild polycythemia which is stable, urinalysis showing no evidence of infection.  CT of abdomen and pelvis shows no acute findings but mild endometrial thickening.  I have explained this to the patient including the need for further evaluation.  I offered an option of getting pelvic ultrasound here, but she has been in the department for over 12 hours and states she would prefer to go home.  I am referring her to gynecology for further evaluation of this.  For pain, I am  discharging her with prescriptions for naproxen  as well as a small number of tramadol  tablets.  I have advised her to take acetaminophen  with naproxen  and only use tramadol  for pain not relieved by that combination.  I have ordered a dose of bisacodyl for her constipation, and I have advised her to use polyethylene glycol at home.  I have reviewed her past records, and do note urgent care visit on 10/12 and ED visit on 10/7 for constipation..     Final diagnoses:  Right lower quadrant pain  Thickened endometrium  Polycythemia  Constipation, unspecified constipation type    ED Discharge Orders          Ordered    naproxen  (NAPROSYN ) 375 MG tablet  2 times daily        07/10/24 0421    traMADol  (ULTRAM ) 50 MG tablet  Every 6 hours PRN        07/10/24 0421               Raford Lenis, MD 07/10/24 [phone]

## 2024-07-10 NOTE — Discharge Instructions (Addendum)
 I have prescribed naproxen  to help with your pain.  This should be taken twice a day.  If it is not giving you sufficient relief, add acetaminophen .  Only take tramadol  for pain that is not relieved by the combination of naproxen  plus acetaminophen .  It is very important that you follow-up with the gynecologist for further evaluation of your uterus.  The CT scan shows that the lining has thickened and this can be a sign of cancer.  Take MiraLAX as needed to treat your constipation.  MiraLAX can safely be taken every day, and you can increase the dose to what ever is needed to adequately treat your constipation.  Return to the emergency department if your symptoms are getting worse.

## 2024-07-19 ENCOUNTER — Other Ambulatory Visit

## 2024-07-29 ENCOUNTER — Other Ambulatory Visit: Payer: Self-pay

## 2024-07-29 ENCOUNTER — Emergency Department (HOSPITAL_COMMUNITY)
Admission: EM | Admit: 2024-07-29 | Discharge: 2024-07-30 | Disposition: A | Discharge status: Home / Self Care | Attending: Emergency Medicine | Admitting: Emergency Medicine

## 2024-07-29 ENCOUNTER — Encounter (HOSPITAL_COMMUNITY): Payer: Self-pay

## 2024-07-29 DIAGNOSIS — I10 Essential (primary) hypertension: Secondary | ICD-10-CM | POA: Insufficient documentation

## 2024-07-29 DIAGNOSIS — N3000 Acute cystitis without hematuria: Secondary | ICD-10-CM | POA: Diagnosis not present

## 2024-07-29 DIAGNOSIS — E119 Type 2 diabetes mellitus without complications: Secondary | ICD-10-CM | POA: Insufficient documentation

## 2024-07-29 DIAGNOSIS — R300 Dysuria: Secondary | ICD-10-CM | POA: Diagnosis present

## 2024-07-29 DIAGNOSIS — Z79899 Other long term (current) drug therapy: Secondary | ICD-10-CM | POA: Diagnosis not present

## 2024-07-29 LAB — BASIC METABOLIC PANEL WITH GFR
Anion gap: 10 (ref 5–15)
BUN: 15 mg/dL (ref 8–23)
CO2: 24 mmol/L (ref 22–32)
Calcium: 9.2 mg/dL (ref 8.9–10.3)
Chloride: 107 mmol/L (ref 98–111)
Creatinine, Ser: 0.75 mg/dL (ref 0.44–1.00)
GFR, Estimated: >60 mL/min (ref >60)
Glucose, Bld: 154 mg/dL — ABNORMAL HIGH (ref 70–99)
Potassium: 3.9 mmol/L (ref 3.5–5.1)
Sodium: 141 mmol/L (ref 135–145)

## 2024-07-29 LAB — URINALYSIS, ROUTINE W REFLEX MICROSCOPIC
Bilirubin Urine: NEGATIVE
Glucose, UA: >=500 mg/dL — AB
Ketones, ur: NEGATIVE mg/dL
Nitrite: NEGATIVE
Protein, ur: NEGATIVE mg/dL
Specific Gravity, Urine: 1.039 — ABNORMAL HIGH (ref 1.005–1.030)
pH: 5 (ref 5.0–8.0)

## 2024-07-29 LAB — CBC
HCT: 48.3 % — ABNORMAL HIGH (ref 36.0–46.0)
Hemoglobin: 16 g/dL — ABNORMAL HIGH (ref 12.0–15.0)
MCH: 28.7 pg (ref 26.0–34.0)
MCHC: 33.1 g/dL (ref 30.0–36.0)
MCV: 86.7 fL (ref 80.0–100.0)
Platelets: 252 K/uL (ref 150–400)
RBC: 5.57 MIL/uL — ABNORMAL HIGH (ref 3.87–5.11)
RDW: 14.2 % (ref 11.5–15.5)
WBC: 10.2 K/uL (ref 4.0–10.5)
nRBC: 0 % (ref 0.0–0.2)

## 2024-07-29 NOTE — ED Triage Notes (Signed)
 Pt coming in reporting that she thinks that she has a urinary tract infection. Pt reporting lower abdominal pain and urinary frequency.

## 2024-07-30 MED ORDER — CEPHALEXIN 500 MG PO CAPS: 500.0000 mg | ORAL_CAPSULE | Freq: Three times a day (TID) | ORAL | 0 refills | Status: AC

## 2024-07-30 MED ORDER — CEPHALEXIN 250 MG PO CAPS
500.0000 mg | ORAL_CAPSULE | Freq: Once | ORAL | Status: AC
  Administered 2024-07-30: 500 mg via ORAL
  Filled 2024-07-30: qty 2

## 2024-07-30 NOTE — ED Provider Notes (Signed)
 [facility] AT [facility] Provider Note   CSN: [id number] Arrival date & time: 07/29/24  2252     Patient presents with: Urinary Frequency and Dysuria   Colleen Williams is a 68 y.o. female.   HPI     This is a 68 year old female who presents with dysuria.  Patient reports dysuria and lower abdominal discomfort.  Ongoing for last several days.  No back pain or fevers.  No nausea or vomiting.  Was seen and evaluated at the end of October with lower abdominal pain and had a CT scan that was largely reassuring with exception of a thickened endometrium.  Denies any vaginal bleeding.  Denies any vaginal discharge or concern for STDs.  Prior to Admission medications   Medication Sig Start Date End Date Taking? Authorizing Provider  cephALEXin  (KEFLEX ) 500 MG capsule Take 1 capsule (500 mg total) by mouth 3 (three) times daily. 07/30/24  Yes Osborne Serio, Charmaine FALCON, MD  Accu-Chek Softclix Lancets lancets Check blood sugar up to 7 times a week 06/01/24   Nooruddin, Saad, MD  Bisacodyl (LAXATIVE PO) Take 1 tablet by mouth daily as needed (constipation).    [provider]  Blood Glucose Monitoring Suppl (ACCU-CHEK GUIDE) w/Device KIT Check blood sugar up to 7 times a week 06/01/24   Nooruddin, Saad, MD  empagliflozin  (JARDIANCE ) 25 MG TABS tablet Take 1 tablet (25 mg total) by mouth daily before breakfast. 01/12/24   Arellano Zameza, Priscila, MD  glucose blood (ACCU-CHEK GUIDE TEST) test strip Check blood sugar up to7 times a week 06/01/24   Nooruddin, Saad, MD  hydrochlorothiazide  (HYDRODIURIL ) 12.5 MG tablet Take 1 tablet (12.5 mg total) by mouth daily. 11/24/23   Arellano Zameza, Priscila, MD  ibuprofen  (ADVIL ) 200 MG tablet Take 200 mg by mouth every 6 (six) hours as needed for mild pain (pain score 1-3).    [provider]  lactulose (CHRONULAC) 10 GM/15ML solution Take 15 mLs (10 g total) by mouth 2 (two) times daily as needed for moderate  constipation. 06/27/24   White, Elizabeth A, PA-C  latanoprost (XALATAN) 0.005 % ophthalmic solution Place 1 drop into both eyes at bedtime. 06/14/23   [provider]  losartan  (COZAAR ) 50 MG tablet Take 1 tablet (50 mg total) by mouth daily. 05/19/24   Syeda, Raeeha, DO  naproxen  (NAPROSYN ) 375 MG tablet Take 1 tablet (375 mg total) by mouth 2 (two) times daily. 07/10/24   Raford Lenis, MD  OVER THE COUNTER MEDICATION Take 30 mLs by mouth once. Took olive oil 30ml orally one time for constipation    [provider]  rosuvastatin  (CRESTOR ) 20 MG tablet Take 1 tablet (20 mg total) by mouth daily. 06/29/24   Syeda, Raeeha, DO  traMADol  (ULTRAM ) 50 MG tablet Take 1 tablet (50 mg total) by mouth every 6 (six) hours as needed. 07/10/24   Raford Lenis, MD    Allergies: Patient has no known allergies.    Review of Systems  Constitutional:  Negative for fever.  Respiratory:  Negative for shortness of breath.   Cardiovascular:  Negative for chest pain.  Gastrointestinal:  Positive for abdominal pain. Negative for nausea and vomiting.  Genitourinary:  Positive for dysuria. Negative for vaginal discharge.  All other systems reviewed and are negative.   Updated Vital Signs BP (!) 127/93 (BP Location: Right Arm)   Pulse 100   Temp 98.2 F (36.8 C) (Oral)   Resp 16   Ht 1.676 m (5' 6)  Wt 92 kg   LMP  (LMP Unknown)   SpO2 99%   BMI 32.74 kg/m   Physical Exam Vitals and nursing note reviewed.  Constitutional:      Appearance: She is well-developed. She is obese. She is not ill-appearing.  HENT:     Head: Normocephalic and atraumatic.  Eyes:     Pupils: Pupils are equal, round, and reactive to light.  Cardiovascular:     Rate and Rhythm: Normal rate and regular rhythm.  Pulmonary:     Effort: Pulmonary effort is normal. No respiratory distress.  Abdominal:     General: Bowel sounds are normal.     Palpations: Abdomen is soft.     Tenderness: There is no abdominal  tenderness. There is no guarding or rebound.  Musculoskeletal:     Cervical back: Neck supple.  Skin:    General: Skin is warm and dry.  Neurological:     Mental Status: She is alert and oriented to person, place, and time.  Psychiatric:        Mood and Affect: Mood normal.     (all labs ordered are listed, but only abnormal results are displayed) Labs Reviewed  URINALYSIS, ROUTINE W REFLEX MICROSCOPIC - Abnormal; Notable for the following components:      Result Value   APPearance HAZY (*)    Specific Gravity, Urine 1.039 (*)    Glucose, UA >=500 (*)    Hgb urine dipstick MODERATE (*)    Leukocytes,Ua LARGE (*)    Bacteria, UA RARE (*)    All other components within normal limits  BASIC METABOLIC PANEL WITH GFR - Abnormal; Notable for the following components:   Glucose, Bld 154 (*)    All other components within normal limits  CBC - Abnormal; Notable for the following components:   RBC 5.57 (*)    Hemoglobin 16.0 (*)    HCT 48.3 (*)    All other components within normal limits  URINE CULTURE    EKG: None  Radiology: No results found.   Procedures   Medications Ordered in the ED  cephALEXin  (KEFLEX ) capsule 500 mg (has no administration in time range)                                    Medical Decision Making Amount and/or Complexity of Data Reviewed Labs: ordered.  Risk Prescription drug management.   This patient presents to the ED for concern of dysuria, this involves an extensive number of treatment options, and is a complaint that carries with it a high risk of complications and morbidity.  I considered the following differential and admission for this acute, potentially life threatening condition.  The differential diagnosis includes cystitis, pyelonephritis, less likely kidney stone, interstitial cystitis MDM:    This is a 68 year old female who presents with concern for dysuria.  She is nontoxic and vital signs are reassuring.  No leukocytosis.   No metabolic derangements.  Urinalysis does show large leuk esterase as well as 21-50 white cells and rare bacteria.  This is in the setting of 6-10 epithelials.  However given her symptoms, will culture and treat.  No systemic symptoms to suggest pyelonephritis.  (Labs, imaging, consults)  Labs: I Ordered, and personally interpreted labs.  The pertinent results include: CBC, BMP, urinalysis  Imaging Studies ordered: I ordered imaging studies including none I independently visualized and interpreted imaging. I agree with the radiologist  interpretation  Additional history obtained from chart review.  External records from outside source obtained and reviewed including prior evaluations  Cardiac Monitoring: The patient was not maintained on a cardiac monitor.  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    Dispense:  21 capsule    Refill:  0    I have reviewed the patients home medicines and have made adjustments as needed  Problem List / ED Course: Problem List Items Addressed This Visit   None Visit Diagnoses       Acute cystitis without hematuria    -  Primary                Final  diagnoses:  Acute cystitis without hematuria    ED Discharge Orders          Ordered    cephALEXin  (KEFLEX ) 500 MG capsule  3 times daily        07/30/24 0306               Josalynn Johndrow, Charmaine FALCON, MD 07/30/24 [phone]

## 2024-07-30 NOTE — Discharge Instructions (Signed)
 You were seen today for urinary symptoms.  Your urinalysis does indicate likely urinary tract infection.  Take medications as prescribed.  If you develop back pain or fevers, you should be reevaluated.

## 2024-07-31 LAB — URINE CULTURE: Culture: >=100000 — AB

## 2024-08-01 ENCOUNTER — Telehealth (HOSPITAL_BASED_OUTPATIENT_CLINIC_OR_DEPARTMENT_OTHER): Payer: Self-pay | Admitting: *Deleted

## 2024-08-01 NOTE — Telephone Encounter (Signed)
 Post ED Visit - Positive Culture Follow-up  Culture report reviewed by antimicrobial stewardship pharmacist: [name] []  Rankin Dee, Pharm.D. []  Venetia Gully, [address].D., BCPS AQ-ID []  Garrel Crews, Pharm.D., BCPS []  Almarie Lunger, Pharm.D., BCPS []  [place], [place].D., BCPS, AAHIVP []  Rosaline Bihari, Pharm.D., BCPS, AAHIVP []  Vernell Meier, PharmD, BCPS []  Latanya Hint, PharmD, BCPS []  Donald Medley, PharmD, BCPS []  Rocky Bold, PharmD []  Dorothyann Alert, PharmD, BCPS [x]  Dorn Poot, PharmD  [facility] []  Rosaline Edison, PharmD []  Romona Bliss, PharmD []  Dolphus Roller, PharmD []  Veva Seip, Rph []  Vernell Daunt) Leonce, PharmD []  Eva Allis, PharmD []  Rosaline Millet, PharmD []  Iantha Batch, PharmD []  Arvin Gauss, PharmD []  Wanda Hasting, PharmD []  Ronal Rav, PharmD []  Rocky Slade, PharmD []  Bard Jeans, PharmD   Positive urine culture Treated with Cephalexin , organism sensitive to the same and no further patient follow-up is required at this time.  Albino Alan Novak 08/01/2024, 10:20 AM

## 2024-08-03 ENCOUNTER — Telehealth: Payer: Self-pay

## 2024-08-03 NOTE — Telephone Encounter (Signed)
 Unanswered. Left VM saying her Diabetic Eye exam from 05/27/24 was negative for retinopathy and that the results were normal.

## 2024-08-17 ENCOUNTER — Telehealth: Payer: Self-pay | Admitting: *Deleted

## 2024-08-17 NOTE — Telephone Encounter (Signed)
 Call to pt to remind of appt scheduled for 12/3 for DM follow up Pt scheduled for MRI on 12/5 CMA wanted to suggest pt come in after MRI to obtain results and f/u DM  No answer-message left on recorder Message also left with tomorrow appt time/date as pt can still be seen as scheduled

## 2024-08-18 ENCOUNTER — Other Ambulatory Visit: Payer: Self-pay

## 2024-08-18 ENCOUNTER — Ambulatory Visit: Admitting: Student

## 2024-08-18 ENCOUNTER — Ambulatory Visit

## 2024-08-18 VITALS — BP 132/84 | HR 97 | Temp 97.6°F | Wt 197.0 lb

## 2024-08-18 DIAGNOSIS — Z7984 Long term (current) use of oral hypoglycemic drugs: Secondary | ICD-10-CM

## 2024-08-18 DIAGNOSIS — E119 Type 2 diabetes mellitus without complications: Secondary | ICD-10-CM | POA: Diagnosis not present

## 2024-08-18 DIAGNOSIS — I10 Essential (primary) hypertension: Secondary | ICD-10-CM

## 2024-08-18 DIAGNOSIS — F17210 Nicotine dependence, cigarettes, uncomplicated: Secondary | ICD-10-CM

## 2024-08-18 DIAGNOSIS — Z79899 Other long term (current) drug therapy: Secondary | ICD-10-CM

## 2024-08-18 DIAGNOSIS — R4189 Other symptoms and signs involving cognitive functions and awareness: Secondary | ICD-10-CM

## 2024-08-18 LAB — POCT GLYCOSYLATED HEMOGLOBIN (HGB A1C): HbA1c, POC (controlled diabetic range): 6.6 % (ref 0.0–7.0)

## 2024-08-18 LAB — GLUCOSE, CAPILLARY: Glucose-Capillary: 156 mg/dL — ABNORMAL HIGH (ref 70–99)

## 2024-08-18 MED ORDER — ACCU-CHEK GUIDE TEST VI STRP: ORAL_STRIP | 12 refills | Status: AC

## 2024-08-18 NOTE — Assessment & Plan Note (Signed)
 Blood pressure slightly over goal at 132/84.  Her readings from previous OV were well-controlled.  Instructed patient to get a cuff and check BP every day and bring her recordings to her next visit.  Will continue with her current medications as increase in BP has been recent.  Will monitor readings and make changes to medication dosing during future visits.  Recent BMP from ED showed normalized potassium levels; will not repeat BMP today.  - Continue losartan  25mg  and hydrochlorothiazide  12.5mg  daily

## 2024-08-18 NOTE — Progress Notes (Addendum)
 "  CC: Follow-up  HPI:  [name] is a 68 y.o. female living with a history stated below and presents today for DM follow-up visit. Please see problem based assessment and plan for additional details.  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     Blood Glucose Monitoring Suppl (ACCU-CHEK GUIDE) w/Device KIT Check blood sugar up to 7 times a week 1 kit 0   cephALEXin  (KEFLEX ) 500 MG capsule Take 1 capsule (500 mg total) by mouth 3 (three) times daily. 21 capsule 0   empagliflozin  (JARDIANCE ) 25 MG TABS tablet Take 1 tablet (25 mg total) by mouth daily before breakfast. 30 tablet 11   glucose blood (ACCU-CHEK GUIDE TEST) test strip Check blood sugar up to7 times a week 100 each 12   hydrochlorothiazide  (HYDRODIURIL ) 12.5 MG tablet Take 1 tablet (12.5 mg total) by mouth daily. 90 tablet 3   ibuprofen  (ADVIL ) 200 MG tablet Take 200 mg by mouth every 6 (six) hours as needed for mild pain (pain score 1-3).     lactulose  (CHRONULAC ) 10 GM/15ML solution Take 15 mLs (10 g total) by mouth 2 (two) times daily as needed for moderate constipation. 236 mL 0   latanoprost (XALATAN) 0.005 % ophthalmic solution Place 1 drop into both eyes at bedtime.     losartan  (COZAAR ) 50 MG tablet Take 1 tablet (50 mg total) by mouth daily. 90 tablet 3   naproxen  (NAPROSYN ) 375 MG tablet Take 1 tablet (375 mg total) by mouth 2 (two) times daily. 20 tablet 0   OVER THE COUNTER MEDICATION Take 30 mLs by mouth once. Took olive oil 30ml orally one time for  constipation     rosuvastatin  (CRESTOR ) 20 MG tablet Take 1 tablet (20 mg total) by mouth daily. 90 tablet 0   traMADol  (ULTRAM ) 50 MG tablet Take 1 tablet (50 mg total) by mouth every 6 (six) hours as needed. 15 tablet 0   No current facility-administered medications on file prior to visit.    Family History  Problem Relation Age of Onset   Diabetes Mother    Colon cancer Neg Hx    Colon polyps Neg Hx    Esophageal cancer Neg Hx    Stomach cancer Neg Hx    Rectal cancer Neg Hx    Breast cancer Neg Hx     Social History   Socioeconomic History   Marital status: Single    Spouse name: Not on file   Number of children: Not on file   Years of education: Not on file   Highest education level: Not on file  Occupational History   Not on file  Tobacco Use   Smoking status: Former    Current packs/day: 0.00    Types: Cigarettes    Quit date: 09/16/2008    Years since quitting: 15.9   Smokeless tobacco: Never  Vaping Use   Vaping status: Never Used  Substance and Sexual Activity   Alcohol use: Yes  Alcohol/week: 0.0 standard drinks of alcohol    Comment: Occasionally wine.   Drug use: No   Sexual activity: Not Currently    Birth control/protection: Surgical  Other Topics Concern   Not on file  Social History Narrative   Not on file   Social Drivers of Health   Financial Resource Strain: Low Risk  (04/24/2022)   Overall Financial Resource Strain (CARDIA)    Difficulty of Paying Living Expenses: Not hard at all  Food Insecurity: No Food Insecurity (10/31/2023)   Hunger Vital Sign    Worried About Running Out of Food in the Last Year: Never true    Ran Out of Food in the Last Year: Never true  Transportation Needs: No Transportation Needs (10/31/2023)   PRAPARE - Administrator, Civil Service (Medical): No    Lack of Transportation (Non-Medical): No  Physical Activity: Insufficiently Active (07/24/2022)   Exercise Vital Sign    Days of Exercise per Week: 3  days    Minutes of Exercise per Session: 30 min  Stress: No Stress Concern Present (04/24/2022)   [employer] of Occupational Health - Occupational Stress Questionnaire    Feeling of Stress : Not at all  Social Connections: Moderately Integrated (04/24/2022)   Social Connection and Isolation Panel    Frequency of Communication with Friends and Family: Twice a week    Frequency of Social Gatherings with Friends and Family: Twice a week    Attends Religious Services: More than 4 times per year    Active Member of [employer] or Organizations: Yes    Attends Engineer, Structural: More than 4 times per year    Marital Status: Never married  Intimate Partner Violence: Not At Risk (04/29/2023)   Humiliation, Afraid, Rape, and Kick questionnaire    Fear of Current or Ex-Partner: No    Emotionally Abused: No    Physically Abused: No    Sexually Abused: No    Review of Systems: ROS  Per HPI, Assessment, Plan There were no vitals filed for this visit.  Physical Exam: Physical Exam HENT:     Head: Normocephalic.  Cardiovascular:     Rate and Rhythm: Normal rate and regular rhythm.  Pulmonary:     Effort: Pulmonary effort is normal.     Breath sounds: Normal breath sounds.  Neurological:     Mental Status: She is alert.  Psychiatric:        Mood and Affect: Mood normal.      Assessment & Plan:     Patient seen with Dr. Francesco  Assessment & Plan Type 2 diabetes mellitus without complication, without long-term current use of insulin (HCC) Patient has been taking her Jardiance  25 mg daily.  Reports she has lost a lot of weight due to being on Jardiance .  Asked with patient that she has only lost about 4 pounds this past year that is not concerning for weight loss.  Emphasized that patient needs to take her medication for diabetes control.  Patient stated understanding -A1c today -Continue Jardiance  25 mg daily Cognitive impairment Patient's MoCA score from last OV was  17/30.  She was evaluated for her cognitive decline with lab work (TSH, B12, RPR) that was normal.  MRI was ordered, however, patient reports she does not want to get MRI done as she believes there is nothing wrong with her.  Discussed with patient the benefits of getting an MRI done and told her that it was ultimately up to  her to make the final decision.  Patient stated understanding but did not want to get MRI done.  Patient was accompanied by grandchildren.  She is very upset with the disturbance caused by her neighbors on the floor above her apartment.  Patient reports she is not able to sleep well at night as she can hear her neighbors walk.  She has addressed this with the apartment's management, however, the problem has not been resolved.  She believes she needs a new place to move into.  Says she can sleep well away from home.  Patient mentions that she does not have any stress and that there is nothing wrong with her.  She finds it very difficult to communicate with her daughter and would like to speak to a therapist.  Reports she is religious and believes in spirits and witches who she believes might have a role to play in all of her conditions.  Patient did not want to talk to me about this further as I was not aware of these topics.  She just keeps to herself and does not talk to anyone.  She calls the couple who moved up [place] and dread[place] and mentions that they go wherever she goes but on the top floor.  She can't explain what they are doing to her.  Repeatedly says she is not crazy.  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  Neurology referral was placed during last visit.  Patient reports not having heard from neurology.  Initially she was hesitant but later agreed to be evaluated by neurology, however, did not want to get her MRI.  Gave patient contact information and address for [facility] where she can walk-in to  speak with a therapist.  Patient stated understanding.  Addendum:  Awaiting further clarification regarding authorized Neurology referral after reaching out to Ms. Chilon Boone.   Addendum 1/9: Ms. Brunetta  reported that several voice messages were left for patient regarding her neurology referral for which she did not respond back.  - Patient declines MRI -Neurology referral f/u Essential hypertension Blood pressure slightly over goal at 132/84.  Her readings from previous OV were well-controlled.  Instructed patient to get a cuff and check BP every day and bring her recordings to her next visit.  Will continue with her current medications as increase in BP has been recent.  Will monitor readings and make changes to medication dosing during future visits.  Recent BMP from ED showed normalized potassium levels; will not repeat BMP today.  - Continue losartan  25mg  and hydrochlorothiazide  12.5mg  daily   No orders of the defined types were placed in this encounter.    Rebecka Pion, D.O. [facility] Internal Medicine, PGY-1 Date 08/18/2024 Time 1:56 PM  "

## 2024-08-18 NOTE — Patient Instructions (Addendum)
 Please follow the instructions as discussed in today's plan: - This is the contact information and address for behavioral health center where you can walk-in to see a therapist:  [facility] Address: [address] [place], [place] [address] Phone: [phone]  -We have placed a neurology referral.  They will contact you in the future. -Continue taking your Jardiance  25 mg for diabetes.  I will call you with your A1c results -Continue taking your losartan  25 mg daily and hydrochlorothiazide  12.5 mg daily. -Document your blood pressure daily and bring to your next visit.   Thank you  Sincerely, Rebecka Pion

## 2024-08-20 ENCOUNTER — Ambulatory Visit (HOSPITAL_COMMUNITY)

## 2024-08-20 NOTE — Progress Notes (Signed)
 Internal Medicine Clinic Attending  I was physically present during the key portions of the resident provided service and participated in the medical decision making of patient's management care. I reviewed pertinent patient test results.  The assessment, diagnosis, and plan were formulated together and I agree with the documentation in the resident's note.  Francesco Elsie NOVAK, MD

## 2024-08-25 ENCOUNTER — Ambulatory Visit: Payer: Self-pay

## 2024-08-27 ENCOUNTER — Emergency Department (HOSPITAL_COMMUNITY)

## 2024-08-27 ENCOUNTER — Other Ambulatory Visit: Payer: Self-pay

## 2024-08-27 ENCOUNTER — Encounter (HOSPITAL_COMMUNITY): Payer: Self-pay

## 2024-08-27 ENCOUNTER — Emergency Department (HOSPITAL_COMMUNITY)
Admission: EM | Admit: 2024-08-27 | Discharge: 2024-08-27 | Disposition: A | Discharge status: Home / Self Care | Attending: Emergency Medicine | Admitting: Emergency Medicine

## 2024-08-27 DIAGNOSIS — M79651 Pain in right thigh: Secondary | ICD-10-CM | POA: Insufficient documentation

## 2024-08-27 DIAGNOSIS — E119 Type 2 diabetes mellitus without complications: Secondary | ICD-10-CM | POA: Insufficient documentation

## 2024-08-27 DIAGNOSIS — M79652 Pain in left thigh: Secondary | ICD-10-CM | POA: Insufficient documentation

## 2024-08-27 LAB — CBC
HCT: 47.2 % — ABNORMAL HIGH (ref 36.0–46.0)
Hemoglobin: 15.6 g/dL — ABNORMAL HIGH (ref 12.0–15.0)
MCH: 28.9 pg (ref 26.0–34.0)
MCHC: 33.1 g/dL (ref 30.0–36.0)
MCV: 87.6 fL (ref 80.0–100.0)
Platelets: 228 K/uL (ref 150–400)
RBC: 5.39 MIL/uL — ABNORMAL HIGH (ref 3.87–5.11)
RDW: 14 % (ref 11.5–15.5)
WBC: 10.5 K/uL (ref 4.0–10.5)
nRBC: 0 % (ref 0.0–0.2)

## 2024-08-27 LAB — COMPREHENSIVE METABOLIC PANEL WITH GFR
ALT: 23 U/L (ref 0–44)
AST: 15 U/L (ref 15–41)
Albumin: 3.4 g/dL — ABNORMAL LOW (ref 3.5–5.0)
Alkaline Phosphatase: 75 U/L (ref 38–126)
Anion gap: 8 (ref 5–15)
BUN: 19 mg/dL (ref 8–23)
CO2: 25 mmol/L (ref 22–32)
Calcium: 9.2 mg/dL (ref 8.9–10.3)
Chloride: 105 mmol/L (ref 98–111)
Creatinine, Ser: 0.65 mg/dL (ref 0.44–1.00)
GFR, Estimated: >60 mL/min (ref >60)
Glucose, Bld: 134 mg/dL — ABNORMAL HIGH (ref 70–99)
Potassium: 3.8 mmol/L (ref 3.5–5.1)
Sodium: 138 mmol/L (ref 135–145)
Total Bilirubin: 0.6 mg/dL (ref 0.0–1.2)
Total Protein: 6.8 g/dL (ref 6.5–8.1)

## 2024-08-27 LAB — URINALYSIS, ROUTINE W REFLEX MICROSCOPIC
Bilirubin Urine: NEGATIVE
Glucose, UA: >=500 mg/dL — AB
Ketones, ur: NEGATIVE mg/dL
Nitrite: NEGATIVE
Protein, ur: NEGATIVE mg/dL
Specific Gravity, Urine: 1.035 — ABNORMAL HIGH (ref 1.005–1.030)
pH: 5 (ref 5.0–8.0)

## 2024-08-27 LAB — CK: Total CK: 33 U/L — ABNORMAL LOW (ref 38–234)

## 2024-08-27 MED ORDER — MORPHINE SULFATE (PF) 4 MG/ML IV SOLN
4.0000 mg | Freq: Once | INTRAVENOUS | Status: AC
  Administered 2024-08-27: 4 mg via INTRAVENOUS
  Filled 2024-08-27: qty 1

## 2024-08-27 MED ORDER — TRAMADOL HCL 50 MG PO TABS: 50.0000 mg | ORAL_TABLET | Freq: Four times a day (QID) | ORAL | 0 refills | Status: AC | PRN

## 2024-08-27 MED ORDER — IOHEXOL 350 MG/ML SOLN
75.0000 mL | Freq: Once | INTRAVENOUS | Status: AC | PRN
  Administered 2024-08-27: 75 mL via INTRAVENOUS

## 2024-08-27 MED ORDER — METHOCARBAMOL 500 MG PO TABS: 500.0000 mg | ORAL_TABLET | Freq: Two times a day (BID) | ORAL | 0 refills | Status: AC

## 2024-08-27 MED ORDER — FLUCONAZOLE 150 MG PO TABS
150.0000 mg | ORAL_TABLET | Freq: Once | ORAL | Status: AC
  Administered 2024-08-27: 150 mg via ORAL
  Filled 2024-08-27: qty 1

## 2024-08-27 NOTE — Discharge Instructions (Addendum)
 As we discussed your CT scan and labs were normal today  You may have muscle strain  I have prescribed Robaxin as needed  I have also prescribed tramadol  as needed for severe pain  Please follow-up with internal medicine clinic  Return to ER if you have worse thigh pain or muscle wasting or trouble walking

## 2024-08-27 NOTE — ED Provider Notes (Signed)
 [place] EMERGENCY DEPARTMENT AT [facility] Provider Note   CSN: [id number] Arrival date & time: 08/27/24  9291     Patient presents with: Weight Loss   Colleen Williams is Williams 68 y.o. female history of diabetes here presenting with thigh wasting.  Patient states that several weeks ago she fell and she noticed that her thighs has been wasting away and decreasing in size.  She states that she also has some muscle pain bilateral thighs as well.  Patient denies any hip pain or back pain.  Denies any leg swelling.  Patient has been followed with internal medicine clinic but states that she has not gotten any answers.  Patient states that she is in severe pain all the time.   The history is provided by the patient.       Prior to Admission medications  Medication Sig Start Date End Date Taking? Authorizing Provider  Accu-Chek Softclix Lancets lancets Check blood sugar up to 7 times Williams week 06/01/24   Colleen, Saad, MD  Bisacodyl  (LAXATIVE PO) Take 1 tablet by mouth daily as needed (constipation).    [provider]  Blood Glucose Monitoring Suppl (ACCU-CHEK GUIDE) w/Device KIT Check blood sugar up to 7 times Williams week 06/01/24   Colleen, Saad, MD  cephALEXin  (KEFLEX ) 500 MG capsule Take 1 capsule (500 mg total) by mouth 3 (three) times daily. 07/30/24   Colleen, Colleen FALCON, MD  empagliflozin  (JARDIANCE ) 25 MG TABS tablet Take 1 tablet (25 mg total) by mouth daily before breakfast. 01/12/24   Arellano Zameza, Priscila, MD  glucose blood (ACCU-CHEK GUIDE TEST) test strip Check blood sugar up to7 times Williams week 08/18/24   Edgardo Pontiff, DO  hydrochlorothiazide  (HYDRODIURIL ) 12.5 MG tablet Take 1 tablet (12.5 mg total) by mouth daily. 11/24/23   Arellano Zameza, Priscila, MD  ibuprofen  (ADVIL ) 200 MG tablet Take 200 mg by mouth every 6 (six) hours as needed for mild pain (pain score 1-3).    [provider]  lactulose  (CHRONULAC ) 10 GM/15ML solution Take 15 mLs (10 g total) by  mouth 2 (two) times daily as needed for moderate constipation. 06/27/24   White, Colleen A, PA-C  latanoprost (XALATAN) 0.005 % ophthalmic solution Place 1 drop into both eyes at bedtime. 06/14/23   [provider]  losartan  (COZAAR ) 50 MG tablet Take 1 tablet (50 mg total) by mouth daily. 05/19/24   Colleen, Raeeha, DO  naproxen  (NAPROSYN ) 375 MG tablet Take 1 tablet (375 mg total) by mouth 2 (two) times daily. 07/10/24   Colleen Lenis, MD  OVER THE COUNTER MEDICATION Take 30 mLs by mouth once. Took olive oil 30ml orally one time for constipation    [provider]  rosuvastatin  (CRESTOR ) 20 MG tablet Take 1 tablet (20 mg total) by mouth daily. 06/29/24   Colleen, Raeeha, DO  traMADol  (ULTRAM ) 50 MG tablet Take 1 tablet (50 mg total) by mouth every 6 (six) hours as needed. 07/10/24   Colleen Lenis, MD    Allergies: Patient has no known allergies.    Review of Systems  Musculoskeletal:        Leg pain   All other systems reviewed and are negative.   Updated Vital Signs BP 130/85 (BP Location: Left Arm)   Pulse 91   Temp 98 F (36.7 C)   Resp 19   Ht 5' 5 (1.651 m)   Wt 93.4 kg   LMP  (LMP Unknown)   SpO2 98%   BMI  34.27 kg/m   Physical Exam Vitals and nursing note reviewed.  HENT:     Head: Normocephalic.     Nose: Nose normal.     Mouth/Throat:     Mouth: Mucous membranes are moist.  Eyes:     Extraocular Movements: Extraocular movements intact.     Pupils: Pupils are equal, round, and reactive to light.  Cardiovascular:     Rate and Rhythm: Normal rate and regular rhythm.     Pulses: Normal pulses.     Heart sounds: Normal heart sounds.  Pulmonary:     Effort: Pulmonary effort is normal.     Breath sounds: Normal breath sounds.  Abdominal:     General: Abdomen is flat.     Palpations: Abdomen is soft.  Musculoskeletal:     Cervical back: Normal range of motion and neck supple.     Comments: Bilateral thigh tenderness but no obvious deformity.  Patient  has 2+ femoral and popliteal and DP pulses  Skin:    General: Skin is warm.     Capillary Refill: Capillary refill takes less than 2 seconds.  Neurological:     General: No focal deficit present.     Mental Status: She is alert and oriented to person, place, and time.  Psychiatric:        Mood and Affect: Mood normal.        Behavior: Behavior normal.     (all labs ordered are listed, but only abnormal results are displayed) Labs Reviewed  COMPREHENSIVE METABOLIC PANEL WITH GFR - Abnormal; Notable for the following components:      Result Value   Glucose, Bld 134 (*)    Albumin 3.4 (*)    All other components within normal limits  CBC - Abnormal; Notable for the following components:   RBC 5.39 (*)    Hemoglobin 15.6 (*)    HCT 47.2 (*)    All other components within normal limits  URINALYSIS, ROUTINE W REFLEX MICROSCOPIC - Abnormal; Notable for the following components:   APPearance CLOUDY (*)    Specific Gravity, Urine 1.035 (*)    Glucose, UA >=500 (*)    Hgb urine dipstick LARGE (*)    Leukocytes,Ua MODERATE (*)    Bacteria, UA RARE (*)    All other components within normal limits  CK    EKG: None  Radiology: No results found.   Procedures   Medications Ordered in the ED  morphine (PF) 4 MG/ML injection 4 mg (has no administration in time range)  fluconazole (DIFLUCAN) tablet 150 mg (has no administration in time range)                                    Medical Decision Making Colleen Williams is Williams 68 y.o. female here presenting with bilateral leg pain and wasting away of bilateral thighs.  Consider rhabdo versus osteomyelitis versus sarcoma versus muscle strain.  Plan to get CBC CMP and CK level and bilateral femur CTs with contrast  9:36 PM CK level is normal.  Patient is on antibiotics and UA showed some yeast and I gave her Diflucan.  CT of bilateral femurs were unremarkable.  Patient is able to ambulate.  Likely muscle strain.  Patient will be  discharged home with tramadol  as needed and Robaxin as needed  Problems Addressed: Pain in both thighs: acute illness or injury  Amount and/or Complexity of  Data Reviewed Labs: ordered. Decision-making details documented in ED Course. Radiology: ordered and independent interpretation performed. Decision-making details documented in ED Course.  Risk Prescription drug management.    Final diagnoses:  None    ED Discharge Orders     None          Patt Alm Macho, MD 08/27/24 2136

## 2024-08-27 NOTE — ED Triage Notes (Signed)
 Pt states I'm losing weight in my legs ever since I fell, but its been a while Pt denies feeling sick. States she is sleeping more lately.

## 2024-08-30 ENCOUNTER — Telehealth: Payer: Self-pay | Admitting: *Deleted

## 2024-08-30 NOTE — Telephone Encounter (Signed)
 Informed pt her A1c has improved and she can continue taking her jardiance .

## 2024-08-30 NOTE — Telephone Encounter (Signed)
 I talked to pt who stated she felled about 1 yr ago and now her legs are hurting. Requesting a MRI. Informed pt, she needs to be seen and evaluated. Call transferred to scheduling; appt scheduled w/Dr [facility] tomorrow 12/16.

## 2024-08-30 NOTE — Telephone Encounter (Signed)
 Copied from CRM [id number]. Topic: Clinical - Request for Lab/Test Order >> Aug 30, 2024  2:38 PM Colleen Williams wrote: Reason for CRM: Patient would like to have MRI ordered for her as she fell down a while back and ever since then she's had discomforts and issues with her legs. She wants to make sure nothing is wrong with them

## 2024-08-31 ENCOUNTER — Ambulatory Visit (INDEPENDENT_AMBULATORY_CARE_PROVIDER_SITE_OTHER)

## 2024-08-31 VITALS — BP 137/89 | HR 100 | Ht 65.0 in | Wt 199.8 lb

## 2024-08-31 DIAGNOSIS — Z9181 History of falling: Secondary | ICD-10-CM

## 2024-08-31 DIAGNOSIS — M79652 Pain in left thigh: Secondary | ICD-10-CM

## 2024-08-31 DIAGNOSIS — M79651 Pain in right thigh: Secondary | ICD-10-CM | POA: Diagnosis not present

## 2024-08-31 MED ORDER — ZIKS ARTHRITIS PAIN RELIEF 0.025-1-12 % EX CREA: 1.0000 | TOPICAL_CREAM | Freq: Two times a day (BID) | CUTANEOUS | 1 refills | Status: AC

## 2024-08-31 MED ORDER — GABAPENTIN 300 MG PO CAPS: 300.0000 mg | ORAL_CAPSULE | Freq: Every day | ORAL | 3 refills | Status: AC

## 2024-08-31 NOTE — Patient Instructions (Signed)
 Thank you, Colleen Williams, for allowing us  to provide your care today. Today we discussed . . .  > Leg pain       - Think that your nerve pain may be coming from a pinched nerve.  The best treatment for this would be weight loss.  We are also prescribing you gabapentin  300 mg to take each night.  I have also sent in a cream that you can use to see if it helps your thigh pain at all.  Please follow-up in 3 months for the rest of your medical conditions    I have ordered the following labs for you:  Lab Orders  No laboratory test(s) ordered today      Referrals ordered today:   Referral Orders  No referral(s) requested today      Follow up: 3 months    Remember:  Should you have any questions or concerns please call the internal medicine clinic at [phone].     [name], [facility]

## 2024-08-31 NOTE — Progress Notes (Signed)
 CC: Acute Concern of bilateral leg pain  HPI:  Colleen Williams is a 68 y.o. female with pertinent PMH of DMII, HLD, HTN, and cognitive impairment who presents for bilateral leg pain. Please see problem based assessment and plan for further history.  ROS  Medications: Current Outpatient Medications  Medication Instructions   Accu-Chek Softclix Lancets lancets Check blood sugar up to 7 times a week   Bisacodyl  (LAXATIVE PO) 1 tablet, Daily PRN   Blood Glucose Monitoring Suppl (ACCU-CHEK GUIDE) w/Device KIT Check blood sugar up to 7 times a week   cephALEXin  (KEFLEX ) 500 mg, Oral, 3 times daily   empagliflozin  (JARDIANCE ) 25 mg, Oral, Daily before breakfast   glucose blood (ACCU-CHEK GUIDE TEST) test strip Check blood sugar up to7 times a week   hydrochlorothiazide  (HYDRODIURIL ) 12.5 mg, Oral, Daily   ibuprofen  (ADVIL ) 200 mg, Oral, Every 6 hours PRN   lactulose  (CHRONULAC ) 10 g, Oral, 2 times daily PRN   latanoprost (XALATAN) 0.005 % ophthalmic solution 1 drop, Both Eyes, Daily at bedtime   losartan  (COZAAR ) 50 mg, Oral, Daily   methocarbamol  (ROBAXIN ) 500 mg, Oral, 2 times daily   naproxen  (NAPROSYN ) 375 mg, Oral, 2 times daily   OVER THE COUNTER MEDICATION 30 mLs,  Once   rosuvastatin  (CRESTOR ) 20 mg, Oral, Daily   traMADol  (ULTRAM ) 50 mg, Oral, Every 6 hours PRN     Physical Exam:  Vitals:   08/31/24 0851  BP: 137/89  Pulse: 100  SpO2: 94%  Weight: 199 lb 12.8 oz (90.6 kg)  Height: 5' 5 (1.651 m)    Physical Exam Constitutional:      General: She is not in acute distress.    Appearance: She is not ill-appearing.  Cardiovascular:     Rate and Rhythm: Normal rate and regular rhythm.  Pulmonary:     Effort: Pulmonary effort is normal.  Musculoskeletal:     Lumbar back: No swelling, deformity, spasms, tenderness or bony tenderness. Normal range of motion. Negative right straight leg raise test and negative left straight leg raise test.     Comments: Full range of  motion in the lumbar spine without reproduction of any pain.  FABER and FADIR test negative.  No tenderness over the greater trochanteric bursa.  Normal sensation bilateral lower extremities.  5 out of 5 strength in bilateral gastrocnemius, tibialis anterior, EHL, hamstrings.  Able to heel and toe walk without difficulty.  Ambulating without any limp.  When seated does have 5 out of 5 strength in the bilateral quadriceps.  Able to squat 3 times but did have difficulty with one fourth squat.   Neurological:     Mental Status: She is alert.       Assessment & Plan:   Assessment & Plan Bilateral thigh pain Patient presents today for an acute care visit for her bilateral thigh pain.  States that this has been going on for about a year.  She had a fall where she felt like her leg gave out on her about a year ago and she was seen in the ED for this problem.  She describes the pain as a burning sensation in her anterior and lateral bilateral thighs, left greater than right.  The pain is not associated with any particular movement.  She does note that the pain is sometimes worse while she is walking and sometimes worse at night.  She has no associated back pain.  She denies bowel or bladder incontinence.  She denies any  paresthesia. Previously has been attributed to sciatica and she has had associated back pain in the past that is not present now.  She has tried physical therapy which she states was unhelpful for her.  She has tried different NSAIDs for this pain as well.  To her recollection she has not tried any gabapentin  as of yet.  She was recently seen in the ED for this problem at which time she had bilateral femur CTs which were negative for any sort of musculoskeletal problem.  Her CK was normal at that time.  Her B12, folate, electrolytes, are within normal limits.  She states that she has noticed some weakness but is still able to climb stairs without difficulty.  She has been using a cane but does  not feel like she really needs it.  On physical exam, she is able to squat 3 times without any weakness but on a fourth deep squat she had difficulty rising from a squatted position.  Otherwise normal strength and sensation of bilateral lower extremities.  Full range of motion of the back without any pain.  FABER and FADIR tests were negative.  Straight leg raise negative.  Differential for this patient includes sciatica, meralgia paresthetica, statin myalgia.  Her symptoms are not reproducible with any provocative testing.  I do wonder if some of this may be meralgia paresthetica secondary to her obesity.  Question whether statin myalgia may be playing a part in this but she has been on a statin for several years without this complication so I think this is less likely.  We will try to manage her symptoms with gabapentin  300 mg nightly and capsaicin cream and see if she has any improvement from this.  She was also advised that both weight loss and continued strengthening would help to improve her condition.  -Rather paresthetica versus lumbar spine radiculopathy - Gabapentin  300 mg nightly - Capsaicin cream twice daily - Patient declined PT but was encouraged to continue with home exercise program - Patient educated on importance of weight loss for condition - Follow-up in 3 months for chronic condition management  No orders of the defined types were placed in this encounter.    Patient seen with Dr. Layman Bee  MDM I reviewed ED notes from December 12th.  Reviewed results of CK, B12, folate, and electrolytes.  Management of this patient included prescription drug management.  This correlates with a moderate level of medical decision making   [name], MD [facility] Internal Medicine Resident PGY-1 Clinic Phone: [phone] Please contact the on call pager at [phone] for any urgent or emergent needs.

## 2024-09-02 NOTE — Progress Notes (Signed)
 Internal Medicine Clinic Attending  I was physically present during the key portions of the resident provided service and participated in the medical decision making of patient's management care. I reviewed pertinent patient test results.  The assessment, diagnosis, and plan were formulated together and I agree with the documentation in the resident's note.  Jeanelle Layman CROME, MD

## 2024-09-06 ENCOUNTER — Telehealth: Payer: Self-pay | Admitting: *Deleted

## 2024-09-06 NOTE — Telephone Encounter (Signed)
 Copied from CRM #[id number]. Topic: Clinical - Medication Question >> Sep 03, 2024  3:39 PM DeAngela L wrote: Reason for CRM: Patient calling to ask questions about the gabapentin  (NEURONTIN ) 300 MG capsule she was prescribed by Dr [name], MD, and she would like to ask will this med help with weight gain?  Pt num  [phone]

## 2024-09-07 ENCOUNTER — Ambulatory Visit: Payer: Self-pay | Admitting: Dietician

## 2024-09-07 VITALS — Wt 200.8 lb

## 2024-09-07 DIAGNOSIS — Z7984 Long term (current) use of oral hypoglycemic drugs: Secondary | ICD-10-CM

## 2024-09-07 DIAGNOSIS — E119 Type 2 diabetes mellitus without complications: Secondary | ICD-10-CM | POA: Diagnosis not present

## 2024-09-07 NOTE — Progress Notes (Signed)
 Lab Results  Component Value Date   HGBA1C 6.6 08/18/2024   HGBA1C 6.7 05/19/2024   HGBA1C 6.7 (A) 02/23/2024   HGBA1C 6.8 (A) 10/30/2023   HGBA1C 7.7 (A) 07/30/2023    Wt Readings from Last 10 Encounters:  09/07/24 200 lb 12.8 oz (91.1 kg)  08/31/24 199 lb 12.8 oz (90.6 kg)  08/27/24 205 lb 14.6 oz (93.4 kg)  08/18/24 197 lb (89.4 kg)  07/29/24 202 lb 13.2 oz (92 kg)  07/09/24 204 lb (92.5 kg)  07/01/24 201 lb 4.8 oz (91.3 kg)  06/27/24 207 lb 0.2 oz (93.9 kg)  06/01/24 207 lb (93.9 kg)  05/19/24 203 lb (92.1 kg)   BP Readings from Last 3 Encounters:  08/31/24 137/89  08/27/24 (!) 129/93  08/18/24 132/84   Diabetes Self-Management Education  Visit Type: Follow-up (MNT in 03/2022, then 05/2024 Initial DSMES for self monitoring)  Appt. Start Time: 930 Appt. End Time: 1015  09/07/2024  [name], identified by name and date of birth, is a 68 y.o. female with a diagnosis of Diabetes:  SABRA Type 2  ASSESSMENT  Weight 200 lb 12.8 oz (91.1 kg). Body mass index is 33.41 kg/m. Only on Jardiance  and taking daily and tolerating it. Aim for BMI<30>25  I am taking a lot of blood pressure medicine discussed that currently with these medicines her blood pressure is controlled but could be lower with healthy lifestyle changes and further weight loss.   Diabetes Self-Management Education - 09/07/24 1000       Visit Information   Visit Type Follow-up   MNT in 03/2022, then 05/2024 Initial DSMES for self monitoring     Health Coping   How would you rate your overall health? Good      Psychosocial Assessment   Patient Belief/Attitude about Diabetes Motivated to manage diabetes   note possible cognitive issues per doctors, patient again states today that she does not think she needs and MRI   What is the hardest part about your diabetes right now, causing you the most concern, or is the most worrisome to you about your diabetes?   Checking blood sugar   she is having trouble with  the meter   Self-care barriers Low literacy;Lack of transportation;Lack of material resources    Self-management support Family    Patient Concerns Healthy Lifestyle   does not want to weigh as low as 170# which is what she said someone suggested. I urgerd her to continue healthy lifestyle choices.   Special Needs Simplified materials    Preferred Learning Style No preference indicated   needs repetition, encouragement   Learning Readiness Ready    How often do you need to have someone help you when you read instructions, pamphlets, or other written materials from your doctor or pharmacy? 3 - Sometimes    What is the last grade level you completed in school? 12      Pre-Education Assessment   Patient understands the diabetes disease and treatment process. Comprehends key points    Patient understands incorporating nutritional management into lifestyle. Needs Review    Patient undertands incorporating physical activity into lifestyle. Needs Review    Patient understands using medications safely. Comprehends key points    Patient understands monitoring blood glucose, interpreting and using results Needs Review    Patient understands prevention, detection, and treatment of acute complications. Comprehends key points    Patient understands prevention, detection, and treatment of chronic complications. Compreheands key points    Patient  understands how to develop strategies to address psychosocial issues. Comprehends key points    Patient understands how to develop strategies to promote health/change behavior. Comprehends key points      Complications   Last HgB A1C per patient/outside source 6.6 %    How often do you check your blood sugar? 3-4 times / week    Fasting Blood glucose range (mg/dL) 29-870;869-820    Postprandial Blood glucose range (mg/dL) 819-799;869-820    Number of hypoglycemic episodes per month 0    Number of hyperglycemic episodes ( >200mg /dL): Occasional    Can you tell  when your blood sugar is high? No    Have you had a dilated eye exam in the past 12 months? Yes    Are you checking your feet? Yes    How many days per week are you checking your feet? 7      Dietary Intake   Breakfast eggs, toast, 10-12 oz juice    Lunch banana, yogurt, water    Dinner 2 pieces baked chicken and 2 slices pizza that her daughter brought home. usually has a vegetable like limas, greens or peas    Beverage(s) water, juice, unsweet tea      Activity / Exercise   Activity / Exercise Type ADL's;Light (walking / raking leaves)    How many days per week do you exercise? 2    How many minutes per day do you exercise? 60    Total minutes per week of exercise 120      Patient Education   Previous Diabetes Education Yes    Healthy Eating Plate Method    Being Active Identified with patient nutritional and/or medication changes necessary with exercise.    Monitoring Other (comment)   will try to complete once she brings meter in     Individualized Goals (developed by patient)   Nutrition General guidelines for healthy choices and portions discussed      Patient Self-Evaluation of Goals - Patient rates self as meeting previously set goals (% of time)   Monitoring >75% (most of the time)   she states this is no longer a goal for her     Outcomes   Expected Outcomes Demonstrated interest in learning but significant barriers to change    Future DMSE 3-4 months    Program Status Not Completed      Subsequent Visit   Since your last visit have you continued or begun to take your medications as prescribed? Yes    Since your last visit have you had your blood pressure checked? Yes    Is your most recent blood pressure lower, unchanged, or higher since your last visit? Lower    Since your last visit have you experienced any weight changes? Loss    Weight Loss (lbs) 3    Since your last visit, are you checking your blood glucose at least once a day? No   today says she does not  think she needs to test dialy and also that she was having problems with her meter         Individualized Plan for Diabetes Self-Management Training:   Learning Objective:  Patient will have a greater understanding of diabetes self-management. Patient education plan is to attend individual and/or group sessions per assessed needs and concerns.   Plan:   Patient Instructions  Your weight, blood pressure, and blood sugar are all heading in the right direction- keep eating healthy and trying to move more  You can buy this with your U-card- to help your thigh pain:   Try the stretches for thigh pain- do in bed before you get out of bed  To lower your blood pressure to keep your kidneys, heart, and brain healthy-  Eat more...  Whole grains: whole grain cereal, whole wheat crackers, whole wheat bread ( 3 grams fiber per slice) and pasta, old fashioned oats & brown rice Whole fruits-1 cup a day (berries, apples, citrus) Vegetables- fresh, frozen or low sodium 2-3 cups a day Lowfat Protein: Beans, chicken, turkey, yogurt, lean cuts of beef and pork, fish 2-3 times a week ( salmon, tuna) Nuts, Seeds and Soy: walnuts, peanuts, pecans, peanut butter, sunflower or pumpkin seeds, flax seeds, chia seeds, tofu  Bring your meter to your next visit so I can help you figure out what is not working   See you in March- happy holidays to you and your family!!!  Arland [phone] (new number)     Expected Outcomes:  Demonstrated interest in learning but significant barriers to change  Education material provided: My Plate and Diabetes Resources  If problems or questions, patient to contact team via:  Phone  Future DSME appointment: 3-4 months Arland Hole, RD 09/07/2024 12:05 PM.

## 2024-09-07 NOTE — Patient Instructions (Addendum)
 Your weight, blood pressure, and blood sugar are all heading in the right direction- keep eating healthy and trying to move more   You can buy this with your U-card- to help your thigh pain:   Try the stretches for thigh pain- do in bed before you get out of bed  To lower your blood pressure to keep your kidneys, heart, and brain healthy-  Eat more...  Whole grains: whole grain cereal, whole wheat crackers, whole wheat bread ( 3 grams fiber per slice) and pasta, old fashioned oats & brown rice Whole fruits-1 cup a day (berries, apples, citrus) Vegetables- fresh, frozen or low sodium 2-3 cups a day Lowfat Protein: Beans, chicken, turkey, yogurt, lean cuts of beef and pork, fish 2-3 times a week ( salmon, tuna) Nuts, Seeds and Soy: walnuts, peanuts, pecans, peanut butter, sunflower or pumpkin seeds, flax seeds, chia seeds, tofu  Bring your meter to your next visit so I can help you figure out what is not working   See you in March- happy holidays to you and your family!!!  Colleen Williams [phone] (new number)

## 2024-09-13 ENCOUNTER — Encounter: Payer: Self-pay | Admitting: Student

## 2024-09-24 NOTE — Assessment & Plan Note (Signed)
 Patient's MoCA score from last OV was 17/30.  She was evaluated for her cognitive decline with lab work (TSH, B12, RPR) that was normal.  MRI was ordered, however, patient reports she does not want to get MRI done as she believes there is nothing wrong with her.  Discussed with patient the benefits of getting an MRI done and told her that it was ultimately up to her to make the final decision.  Patient stated understanding but did not want to get MRI done.  Patient was accompanied by grandchildren.  She is very upset with the disturbance caused by her neighbors on the floor above her apartment.  Patient reports she is not able to sleep well at night as she can hear her neighbors walk.  She has addressed this with the apartment's management, however, the problem has not been resolved.  She believes she needs a new place to move into.  Says she can sleep well away from home.  Patient mentions that she does not have any stress and that there is nothing wrong with her.  She finds it very difficult to communicate with her daughter and would like to speak to a therapist.  Reports she is religious and believes in spirits and witches who she believes might have a role to play in all of her conditions.  Patient did not want to talk to me about this further as I was not aware of these topics.  She just keeps to herself and does not talk to anyone.  She calls the couple who moved up [place] and mentions that they go wherever she goes but on the top floor.  She can't explain what they are doing to her.  Repeatedly says she is not crazy.  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  Neurology referral was placed during last visit.  Patient reports not having heard from neurology.  Initially she was hesitant but later agreed to be evaluated by neurology, however, did not want to get her MRI.  Gave patient contact information and address for [facility] where she can walk-in to speak with a therapist.  Patient stated understanding.  Addendum:  Awaiting further clarification regarding authorized Neurology referral after reaching out to Ms. Chilon Boone.   Addendum 1/9: Ms. Brunetta  reported that several voice messages were left for patient regarding her neurology referral for which she did not respond back.  - Patient declines MRI -Neurology referral f/u

## 2024-09-29 ENCOUNTER — Telehealth: Payer: Self-pay | Admitting: *Deleted

## 2024-09-29 NOTE — Telephone Encounter (Signed)
 Dexa Scann appt. 07-19-2024 AUTHOR

## 2024-10-07 ENCOUNTER — Other Ambulatory Visit: Payer: Self-pay | Admitting: *Deleted

## 2024-10-07 DIAGNOSIS — E1169 Type 2 diabetes mellitus with other specified complication: Secondary | ICD-10-CM

## 2024-10-07 MED ORDER — ROSUVASTATIN CALCIUM 20 MG PO TABS: 20.0000 mg | ORAL_TABLET | Freq: Every day | ORAL | 2 refills | Status: AC

## 2024-12-07 ENCOUNTER — Ambulatory Visit: Payer: Self-pay | Admitting: Dietician
